# Patient Record
Sex: Male | Born: 1950 | Race: White | Hispanic: No | Smoking: Former smoker
Health system: Southern US, Community
[De-identification: ages and names within clinical notes are randomized; demographics above are authoritative.]

## PROBLEM LIST (undated history)

## (undated) DIAGNOSIS — C801 Malignant (primary) neoplasm, unspecified: Secondary | ICD-10-CM

## (undated) DIAGNOSIS — C679 Malignant neoplasm of bladder, unspecified: Secondary | ICD-10-CM

## (undated) HISTORY — PX: BRAIN SURGERY: SHX531

## (undated) HISTORY — PX: BLADDER SURGERY: SHX569

---
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
## 2003-10-16 ENCOUNTER — Other Ambulatory Visit: Payer: Self-pay

## 2005-12-20 ENCOUNTER — Emergency Department: Payer: Self-pay | Admitting: Emergency Medicine

## 2005-12-20 ENCOUNTER — Other Ambulatory Visit: Payer: Self-pay

## 2005-12-30 ENCOUNTER — Ambulatory Visit: Payer: Self-pay | Admitting: Emergency Medicine

## 2006-12-08 ENCOUNTER — Emergency Department: Payer: Self-pay | Admitting: Emergency Medicine

## 2014-02-23 ENCOUNTER — Inpatient Hospital Stay: Payer: Self-pay | Admitting: Internal Medicine

## 2014-02-23 LAB — COMPREHENSIVE METABOLIC PANEL
ALK PHOS: 49 U/L
ALT: 18 U/L (ref 12–78)
AST: 20 U/L (ref 15–37)
Albumin: 2.6 g/dL — ABNORMAL LOW (ref 3.4–5.0)
Anion Gap: 30 — ABNORMAL HIGH (ref 7–16)
BILIRUBIN TOTAL: 0.3 mg/dL (ref 0.2–1.0)
BUN: 24 mg/dL — ABNORMAL HIGH (ref 7–18)
CHLORIDE: 103 mmol/L (ref 98–107)
Calcium, Total: 7.8 mg/dL — ABNORMAL LOW (ref 8.5–10.1)
Co2: 6 mmol/L — CL (ref 21–32)
Creatinine: 3.36 mg/dL — ABNORMAL HIGH (ref 0.60–1.30)
GFR CALC AF AMER: 22 — AB
GFR CALC NON AF AMER: 19 — AB
GLUCOSE: 156 mg/dL — AB (ref 65–99)
Osmolality: 285 (ref 275–301)
POTASSIUM: 3.9 mmol/L (ref 3.5–5.1)
Sodium: 139 mmol/L (ref 136–145)
TOTAL PROTEIN: 5.1 g/dL — AB (ref 6.4–8.2)

## 2014-02-23 LAB — CBC WITH DIFFERENTIAL/PLATELET
BANDS NEUTROPHIL: 8 %
HCT: 15.7 % — AB (ref 40.0–52.0)
HGB: 4.7 g/dL — CL (ref 13.0–18.0)
Lymphocytes: 11 %
MCH: 30.8 pg (ref 26.0–34.0)
MCHC: 29.9 g/dL — ABNORMAL LOW (ref 32.0–36.0)
MCV: 103 fL — ABNORMAL HIGH (ref 80–100)
MONOS PCT: 4 %
Metamyelocyte: 4 %
Myelocyte: 1 %
NRBC/100 WBC: 1 /
Platelet: 292 10*3/uL (ref 150–440)
RBC: 1.52 10*6/uL — ABNORMAL LOW (ref 4.40–5.90)
RDW: 15.4 % — ABNORMAL HIGH (ref 11.5–14.5)
Segmented Neutrophils: 72 %
WBC: 43 10*3/uL — ABNORMAL HIGH (ref 3.8–10.6)

## 2014-02-23 LAB — APTT: Activated PTT: 23.5 secs — ABNORMAL LOW (ref 23.6–35.9)

## 2014-02-23 LAB — TROPONIN I

## 2014-02-23 LAB — PROTIME-INR
INR: 1.5
Prothrombin Time: 17.4 secs — ABNORMAL HIGH (ref 11.5–14.7)

## 2014-02-23 LAB — SALICYLATE LEVEL: Salicylates, Serum: 2.7 mg/dL

## 2014-02-23 LAB — LIPASE, BLOOD: LIPASE: 58 U/L — AB (ref 73–393)

## 2014-02-24 LAB — URINALYSIS, COMPLETE: Specific Gravity: 1.04 (ref 1.003–1.030)

## 2014-02-24 LAB — BASIC METABOLIC PANEL
ANION GAP: 6 — AB (ref 7–16)
Anion Gap: 7 (ref 7–16)
Anion Gap: 7 (ref 7–16)
Anion Gap: 7 (ref 7–16)
BUN: 30 mg/dL — ABNORMAL HIGH (ref 7–18)
BUN: 30 mg/dL — ABNORMAL HIGH (ref 7–18)
BUN: 30 mg/dL — AB (ref 7–18)
BUN: 25 mg/dL — ABNORMAL HIGH (ref 7–18)
CALCIUM: 7.2 mg/dL — AB (ref 8.5–10.1)
CHLORIDE: 108 mmol/L — AB (ref 98–107)
CREATININE: 1.64 mg/dL — AB (ref 0.60–1.30)
CREATININE: 1.23 mg/dL (ref 0.60–1.30)
Calcium, Total: 7.2 mg/dL — ABNORMAL LOW (ref 8.5–10.1)
Calcium, Total: 7 mg/dL — CL (ref 8.5–10.1)
Calcium, Total: 7.5 mg/dL — ABNORMAL LOW (ref 8.5–10.1)
Chloride: 111 mmol/L — ABNORMAL HIGH (ref 98–107)
Chloride: 109 mmol/L — ABNORMAL HIGH (ref 98–107)
Chloride: 110 mmol/L — ABNORMAL HIGH (ref 98–107)
Co2: 24 mmol/L (ref 21–32)
Co2: 25 mmol/L (ref 21–32)
Co2: 26 mmol/L (ref 21–32)
Co2: 24 mmol/L (ref 21–32)
Creatinine: 1.8 mg/dL — ABNORMAL HIGH (ref 0.60–1.30)
Creatinine: 1.51 mg/dL — ABNORMAL HIGH (ref 0.60–1.30)
EGFR (African American): 46 — ABNORMAL LOW
EGFR (African American): 51 — ABNORMAL LOW
EGFR (African American): 57 — ABNORMAL LOW
EGFR (African American): >60
EGFR (Non-African Amer.): 39 — ABNORMAL LOW
EGFR (Non-African Amer.): 44 — ABNORMAL LOW
GFR CALC NON AF AMER: 49 — AB
GLUCOSE: 115 mg/dL — AB (ref 65–99)
Glucose: 113 mg/dL — ABNORMAL HIGH (ref 65–99)
Glucose: 116 mg/dL — ABNORMAL HIGH (ref 65–99)
Glucose: 90 mg/dL (ref 65–99)
OSMOLALITY: 286 (ref 275–301)
OSMOLALITY: 288 (ref 275–301)
OSMOLALITY: 285 (ref 275–301)
Osmolality: 290 (ref 275–301)
POTASSIUM: 4.3 mmol/L (ref 3.5–5.1)
Potassium: 4.4 mmol/L (ref 3.5–5.1)
Potassium: 4.2 mmol/L (ref 3.5–5.1)
Potassium: 4.4 mmol/L (ref 3.5–5.1)
SODIUM: 142 mmol/L (ref 136–145)
Sodium: 140 mmol/L (ref 136–145)
Sodium: 141 mmol/L (ref 136–145)
Sodium: 141 mmol/L (ref 136–145)

## 2014-02-24 LAB — CBC WITH DIFFERENTIAL/PLATELET
BASOS ABS: 0 10*3/uL (ref 0.0–0.1)
BASOS PCT: 0.1 %
EOS PCT: 0 %
Eosinophil #: 0 10*3/uL (ref 0.0–0.7)
HCT: 19.1 % — ABNORMAL LOW (ref 40.0–52.0)
HGB: 6.4 g/dL — ABNORMAL LOW (ref 13.0–18.0)
Lymphocyte #: 1.4 10*3/uL (ref 1.0–3.6)
Lymphocyte %: 5.8 %
MCH: 30.8 pg (ref 26.0–34.0)
MCHC: 33.6 g/dL (ref 32.0–36.0)
MCV: 92 fL (ref 80–100)
MONO ABS: 1.8 x10 3/mm — AB (ref 0.2–1.0)
Monocyte %: 7.6 %
Neutrophil #: 21 10*3/uL — ABNORMAL HIGH (ref 1.4–6.5)
Neutrophil %: 86.5 %
Platelet: 102 10*3/uL — ABNORMAL LOW (ref 150–440)
RBC: 2.08 10*6/uL — ABNORMAL LOW (ref 4.40–5.90)
RDW: 14.7 % — ABNORMAL HIGH (ref 11.5–14.5)
WBC: 24.3 10*3/uL — ABNORMAL HIGH (ref 3.8–10.6)

## 2014-02-24 LAB — CK: CK, Total: 222 U/L

## 2014-02-25 ENCOUNTER — Ambulatory Visit: Payer: Self-pay | Admitting: Urology

## 2014-02-25 LAB — CBC WITH DIFFERENTIAL/PLATELET
BASOS PCT: 0.2 %
Basophil #: 0.1 10*3/uL (ref 0.0–0.1)
Basophil #: 0 10*3/uL (ref 0.0–0.1)
Basophil %: 0.9 %
EOS PCT: 0.2 %
Eosinophil #: 0 10*3/uL (ref 0.0–0.7)
Eosinophil #: 0 10*3/uL (ref 0.0–0.7)
Eosinophil %: 0.1 %
HCT: 24.8 % — ABNORMAL LOW (ref 40.0–52.0)
HCT: 22.3 % — ABNORMAL LOW (ref 40.0–52.0)
HGB: 8.4 g/dL — ABNORMAL LOW (ref 13.0–18.0)
HGB: 7.7 g/dL — ABNORMAL LOW (ref 13.0–18.0)
LYMPHS PCT: 9.5 %
LYMPHS PCT: 11.3 %
Lymphocyte #: 1.2 10*3/uL (ref 1.0–3.6)
Lymphocyte #: 1.1 10*3/uL (ref 1.0–3.6)
MCH: 30.2 pg (ref 26.0–34.0)
MCH: 30.5 pg (ref 26.0–34.0)
MCHC: 33.8 g/dL (ref 32.0–36.0)
MCHC: 34.3 g/dL (ref 32.0–36.0)
MCV: 89 fL (ref 80–100)
MCV: 89 fL (ref 80–100)
MONOS PCT: 6 %
MONOS PCT: 7.9 %
Monocyte #: 0.7 x10 3/mm (ref 0.2–1.0)
Monocyte #: 0.8 x10 3/mm (ref 0.2–1.0)
NEUTROS ABS: 8.2 10*3/uL — AB (ref 1.4–6.5)
NEUTROS PCT: 83.4 %
NEUTROS PCT: 80.5 %
Neutrophil #: 10.4 10*3/uL — ABNORMAL HIGH (ref 1.4–6.5)
PLATELETS: 49 10*3/uL — AB (ref 150–440)
PLATELETS: 39 10*3/uL — AB (ref 150–440)
RBC: 2.78 10*6/uL — AB (ref 4.40–5.90)
RBC: 2.51 10*6/uL — AB (ref 4.40–5.90)
RDW: 14.8 % — AB (ref 11.5–14.5)
RDW: 15 % — ABNORMAL HIGH (ref 11.5–14.5)
WBC: 12.4 10*3/uL — ABNORMAL HIGH (ref 3.8–10.6)
WBC: 10.1 10*3/uL (ref 3.8–10.6)

## 2014-02-25 LAB — BASIC METABOLIC PANEL
Anion Gap: 5 — ABNORMAL LOW (ref 7–16)
Anion Gap: 8 (ref 7–16)
BUN: 20 mg/dL — AB (ref 7–18)
BUN: 17 mg/dL (ref 7–18)
CHLORIDE: 107 mmol/L (ref 98–107)
CO2: 27 mmol/L (ref 21–32)
Calcium, Total: 7.6 mg/dL — ABNORMAL LOW (ref 8.5–10.1)
Calcium, Total: 7.8 mg/dL — ABNORMAL LOW (ref 8.5–10.1)
Chloride: 109 mmol/L — ABNORMAL HIGH (ref 98–107)
Co2: 26 mmol/L (ref 21–32)
Creatinine: 1.11 mg/dL (ref 0.60–1.30)
Creatinine: 0.77 mg/dL (ref 0.60–1.30)
EGFR (African American): >60
EGFR (African American): >60
EGFR (Non-African Amer.): >60
EGFR (Non-African Amer.): >60
Glucose: 82 mg/dL (ref 65–99)
Glucose: 79 mg/dL (ref 65–99)
OSMOLALITY: 283 (ref 275–301)
Osmolality: 282 (ref 275–301)
Potassium: 4.1 mmol/L (ref 3.5–5.1)
Potassium: 4.1 mmol/L (ref 3.5–5.1)
Sodium: 141 mmol/L (ref 136–145)
Sodium: 141 mmol/L (ref 136–145)

## 2014-02-25 LAB — URINE CULTURE

## 2014-02-25 LAB — MAGNESIUM: Magnesium: 1.8 mg/dL

## 2014-02-26 LAB — BASIC METABOLIC PANEL
ANION GAP: 4 — AB (ref 7–16)
BUN: 17 mg/dL (ref 7–18)
CALCIUM: 7.4 mg/dL — AB (ref 8.5–10.1)
CO2: 27 mmol/L (ref 21–32)
CREATININE: 1.08 mg/dL (ref 0.60–1.30)
Chloride: 107 mmol/L (ref 98–107)
EGFR (African American): >60
EGFR (Non-African Amer.): >60
Glucose: 89 mg/dL (ref 65–99)
Osmolality: 277 (ref 275–301)
Potassium: 3.8 mmol/L (ref 3.5–5.1)
Sodium: 138 mmol/L (ref 136–145)

## 2014-02-26 LAB — CBC WITH DIFFERENTIAL/PLATELET
BASOS PCT: 0.2 %
Basophil #: 0 10*3/uL (ref 0.0–0.1)
Eosinophil #: 0 10*3/uL (ref 0.0–0.7)
Eosinophil %: 0.3 %
HCT: 21.4 % — ABNORMAL LOW (ref 40.0–52.0)
HGB: 7.2 g/dL — AB (ref 13.0–18.0)
LYMPHS ABS: 1 10*3/uL (ref 1.0–3.6)
Lymphocyte %: 14.8 %
MCH: 29.9 pg (ref 26.0–34.0)
MCHC: 33.5 g/dL (ref 32.0–36.0)
MCV: 90 fL (ref 80–100)
Monocyte #: 0.6 x10 3/mm (ref 0.2–1.0)
Monocyte %: 9 %
NEUTROS ABS: 5.2 10*3/uL (ref 1.4–6.5)
NEUTROS PCT: 75.7 %
Platelet: 52 10*3/uL — ABNORMAL LOW (ref 150–440)
RBC: 2.39 10*6/uL — ABNORMAL LOW (ref 4.40–5.90)
RDW: 14.5 % (ref 11.5–14.5)
WBC: 6.9 10*3/uL (ref 3.8–10.6)

## 2014-02-27 LAB — CBC WITH DIFFERENTIAL/PLATELET
BASOS ABS: 0 10*3/uL (ref 0.0–0.1)
Basophil %: 0.4 %
Eosinophil #: 0.1 10*3/uL (ref 0.0–0.7)
Eosinophil %: 0.9 %
HCT: 22.2 % — ABNORMAL LOW (ref 40.0–52.0)
HGB: 7.5 g/dL — ABNORMAL LOW (ref 13.0–18.0)
LYMPHS PCT: 13.8 %
Lymphocyte #: 1 10*3/uL (ref 1.0–3.6)
MCH: 29.9 pg (ref 26.0–34.0)
MCHC: 33.7 g/dL (ref 32.0–36.0)
MCV: 89 fL (ref 80–100)
MONOS PCT: 7.4 %
Monocyte #: 0.5 x10 3/mm (ref 0.2–1.0)
NEUTROS ABS: 5.4 10*3/uL (ref 1.4–6.5)
Neutrophil %: 77.5 %
Platelet: 62 10*3/uL — ABNORMAL LOW (ref 150–440)
RBC: 2.5 10*6/uL — AB (ref 4.40–5.90)
RDW: 14.6 % — ABNORMAL HIGH (ref 11.5–14.5)
WBC: 6.9 10*3/uL (ref 3.8–10.6)

## 2014-03-01 LAB — HEMOGLOBIN: HGB: 8.2 g/dL — AB (ref 13.0–18.0)

## 2014-03-01 LAB — CULTURE, BLOOD (SINGLE)

## 2014-03-01 LAB — PLATELET COUNT: PLATELETS: 93 10*3/uL — AB (ref 150–440)

## 2014-03-02 LAB — CBC WITH DIFFERENTIAL/PLATELET
BASOS ABS: 0 10*3/uL (ref 0.0–0.1)
Basophil %: 0.2 %
Eosinophil #: 0.3 10*3/uL (ref 0.0–0.7)
Eosinophil %: 2.1 %
HCT: 26.2 % — ABNORMAL LOW (ref 40.0–52.0)
HGB: 8.7 g/dL — AB (ref 13.0–18.0)
LYMPHS PCT: 9.4 %
Lymphocyte #: 1.4 10*3/uL (ref 1.0–3.6)
MCH: 29 pg (ref 26.0–34.0)
MCHC: 33.1 g/dL (ref 32.0–36.0)
MCV: 88 fL (ref 80–100)
MONOS PCT: 3.7 %
Monocyte #: 0.5 x10 3/mm (ref 0.2–1.0)
Neutrophil #: 12.4 10*3/uL — ABNORMAL HIGH (ref 1.4–6.5)
Neutrophil %: 84.6 %
Platelet: 136 10*3/uL — ABNORMAL LOW (ref 150–440)
RBC: 2.99 10*6/uL — ABNORMAL LOW (ref 4.40–5.90)
RDW: 14.6 % — ABNORMAL HIGH (ref 11.5–14.5)
WBC: 14.7 10*3/uL — ABNORMAL HIGH (ref 3.8–10.6)

## 2014-03-15 LAB — PATHOLOGY REPORT

## 2014-03-23 ENCOUNTER — Ambulatory Visit: Payer: Self-pay | Admitting: Urology

## 2014-03-24 LAB — URINE CULTURE

## 2014-03-29 ENCOUNTER — Ambulatory Visit: Payer: Self-pay | Admitting: Urology

## 2014-03-30 LAB — PATHOLOGY REPORT

## 2015-02-03 NOTE — Consult Note (Signed)
Chief Complaint:  Subjective/Chief Complaint No complaints   VITAL SIGNS/ANCILLARY NOTES: **Vital Signs.:   19-May-15 08:12  Temperature Temperature (F) 98.3  Pulse Pulse 530  Systolic BP Systolic BP 051  Diastolic BP (mmHg) Diastolic BP (mmHg) 83   Brief Assessment:  GEN no acute distress   Additional Physical Exam CBI effluent amber on minimal flow   Assessment/Plan:  Assessment/Plan:  Assessment Gross hematuria- resolving   Plan 1. d/c CBI 2. If no medical contraindication to anesthesia will proceed with cystoscopy and possible TURBT on 5/20.  The procedure was discussed including portential risks of bleeding, infection and bladder perforation.  He indicated all questions were answered to his satisfaction and desires to proceed.   Electronic Signatures: Abbie Sons (MD)  (Signed 19-May-15 16:36)  Authored: Chief Complaint, VITAL SIGNS/ANCILLARY NOTES, Brief Assessment, Assessment/Plan   Last Updated: 19-May-15 16:36 by Abbie Sons (MD)

## 2015-02-03 NOTE — Consult Note (Signed)
Brief Consult Note: Diagnosis: Thrombocytopenia.   Comments: Patient had a normal platelet count 2 days ago. NOw significant decrease in plt count. Would discontinue all heparin products and send off HIT panel. If active bleeding would transfuse 1 unit of platelets. Otherwise transfuse if plt count < 10 000.  Electronic Signatures: Georges Mouse (MD)  (Signed 16-May-15 16:36)  Authored: Brief Consult Note   Last Updated: 16-May-15 16:36 by Georges Mouse (MD)

## 2015-02-03 NOTE — Op Note (Signed)
PATIENT NAME:  Johnny Martin, Johnny Martin MR#:  [id number] DATE OF BIRTH:  [birth year]  DATE OF PROCEDURE:  03/01/2014  PREOPERATIVE DIAGNOSIS:  Gross hematuria.   POSTOPERATIVE DIAGNOSIS: 1.  Bladder tumor.   PROCEDURE:  Transurethral resection of bladder tumor.   SURGEON: John Giovanni, M.D.   ASSISTANT:  None.   ANESTHESIA:  General.   INDICATION: A 64 year old male admitted with gross hematuria and clot retention. He has had intermittent gross hematuria for the past 8 months. Hemoglobin on admission was 4.7. On initial consultation, a hematuria catheter was placed and a significant amount of clot was removed. He was then placed on continuous bladder irrigation with resolution of his hematuria. He is medically stable and presents for cystoscopy under anesthesia.   DESCRIPTION OF PROCEDURE:  The patient was taken to the operating room and placed on the table in the supine position. A general anesthetic was administered via an endotracheal tube. He was then placed in the low lithotomy position and his external genitalia were prepped and draped in the usual fashion. A 27 French continuous flow resectoscope sheath with visual obturator was lubricated and passed without difficulty. There was mild lateral lobe enlargement and no significant bladder neck elevation in the prosthetic urethra. The remainder of the urethra was normal in appearance. There was a moderate amount of old-appearing clot in the bladder which was removed with irrigation. On the right lateral wall was a large papillary tumor, estimated size greater than 8 cm. No bleeding was noted. 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 One of the mid wall diverticulum had papillary tumor on the edges and a small papillary tumor within the diverticulum. On the posterior wall was an area of erythema and bullous edema though most likely secondary to inflammatory changes of his Foley catheter. The ureteral orifices were  normal appearing with clear efflux. The right ureteral orifice was just inferior to the inferior margin of the tumor. Working from posterior to anterior and superior to inferior, the bladder tumor was resected. Hemostasis was obtained with cautery. After all visible tumor was resected, it was removed with irrigation. The tumor encroaching the neck of the mid diverticulum was also resected. Second specimen, base of the tumor, was sent separately, and a third specimen involving the inflammatory changes of the posterior wall was resected. Button electrode was then placed through the cystoscope and hemostasis was obtained. The small papillary tumor within the mid diverticulum was cauterized. At the completion of the procedure, hemostasis was adequate. No visible tumor was seen. The ureteral orifices were normal-appearing with clear efflux. A 20 French Foley catheter was placed with return of clear effluent upon irrigation. A B and O suppository was placed per rectum. Prostate was estimated approximately 30 grams, smooth, without nodules. The patient was taken to PACU in stable condition. There were no complications. EBL was minimal.    ____________________________ Ronda Fairly. Bernardo Heater, MD scs:dmm D: 03/08/2014 14:51:22 ET T: 03/08/2014 21:29:42 ET JOB#: [id number]  cc: Nicki Reaper C. Bernardo Heater, MD, <Dictator> Abbie Sons MD ELECTRONICALLY SIGNED 03/22/2014 15:17

## 2015-02-03 NOTE — Consult Note (Signed)
PATIENT NAME:  Johnny Martin, Johnny Martin MR#:  [id number] DATE OF BIRTH:  23-Jan-1951  DATE OF CONSULTATION:  02/24/2014  REFERRING PHYSICIAN:  Dr. Waldron Labs. CONSULTING PHYSICIAN:  Scott C. Bernardo Heater, MD  REASON FOR CONSULTATION:  Hematuria.   HISTORY OF PRESENT ILLNESS: A 64 year old white male presented to the Emergency Department with hypoxia and pallor. His hemoglobin was 4.7. Creatinine was 3.36. Foley catheter was placed which was remarkable for gross hematuria. He was placed on continuous bladder irrigation. The patient states that he has had intermittent gross hematuria for the past 8 months. He has mild urinary frequency and hesitancy. He was seen in the Emergency Department in 2008 for gross hematuria. He states he saw a urologist 3 to 4 years ago and states he had a cystoscopy which did not show any abnormalities. He does not remember who he saw and no records could be found on review.  CT angiography did show filling defects within the bladder without hydronephrosis. This was performed last night. A renal ultrasound performed this morning showed bilateral hydronephrosis and was felt to show a 12 cm bladder mass. Late this morning, his CBI stopped flowing and his catheter could not be irrigated. The CBI has been turned off.   PAST MEDICAL HISTORY: 1.  Hypertension.  2.  Tobacco abuse.  3.  Alcohol abuse.   PAST SURGICAL HISTORY:  None.   MEDICATIONS ON ADMISSION:  None.  SOCIAL HISTORY:  The patient is a 1-1/2 pack per day smoker for several years. He drinks approximately 1 case of beer per week.   ALLERGIES:  NKDA.  REVIEW OF SYSTEMS.  CONSTITUTIONAL:  Denies fever, chills. Positive weakness.  EYES:  Denies visual changes.  ENT:  Denies hearing loss.  PULMONARY:  Denies cough, shortness breath.  CARDIOVASCULAR:  Denies chest pain, palpitations.  GASTROINTESTINAL:  Denies nausea, vomiting. Positive lower abdominal discomfort.  GENITOURINARY:  As per the HPI.  ENDOCRINE:  Denies polyuria,  polydipsia.  HEMATOLOGIC:  No history of bleeding or clotting disorders.  INTEGUMENT:  No history of rash.  MUSCULOSKELETAL:  Denies joint pain.  NEUROLOGIC:  No history of CVA, TIA.  PSYCHIATRIC:  Denies depression, anxiety.   PHYSICAL EXAMINATION: VITAL SIGNS:  BP was 133/56, pulse 110. Temp is 98.4.  GENERAL:  Pale male in moderate distress secondary to suprapubic discomfort. The bladder is palpable above the symphysis pubis. There was mild tenderness present.  GENITOURINARY:   There is a 24 three-way Foley catheter in place, which was removed. The phallus without lesions. Testes descended bilaterally without masses or tenderness. Prostate exam was deferred at this time.   A 24 French three-way Couvelaire catheter was placed without difficulty. A large amount of dark, bloody urine was obtained. Balloon was inflated and the catheter was manually irrigated with extensive dark clot removed. After 4 liters of irrigation with sterile saline, the effluent was clot-free and pink-tinged. The catheter was placed back to CBI and on a moderate flow, the effluent was light pink. CT was reviewed. There is not a delayed study and there are no gross renal masses or hydronephrosis. The bladder distended with filling defects. The renal ultrasound performed this morning shows mild hydronephrosis.   IMPRESSION: 1.  Gross hematuria with clot retention.  2.  New onset mild hydronephrosis bilaterally most likely secondary to urinary retention.  3.  The majority of the intraluminal bladder mass most likely represents clot.  4.  Acute renal failure, probable multifactorial.   RECOMMENDATION: 1.  Continue CBI.   2.  The patient will need cystoscopy in the near future to evaluate for transitional cell carcinoma and other lower tract sources of bleeding.  3.  Will follow.     ____________________________ Ronda Fairly Bernardo Heater, MD scs:dmm D: 02/24/2014 18:37:06 ET T: 02/24/2014 19:58:33 ET JOB#: [id number]  cc: Nicki Reaper  C. Bernardo Heater, MD, <Dictator> Abbie Sons MD ELECTRONICALLY SIGNED 03/08/2014 15:21

## 2015-02-03 NOTE — Consult Note (Signed)
Chief Complaint:  Subjective/Chief Complaint Pt seen at ~ 1300.  No complaints   VITAL SIGNS/ANCILLARY NOTES: **Vital Signs.:   18-May-15 15:24  Temperature Temperature (F) 98.1  Pulse Pulse 95  Systolic BP Systolic BP 146  Diastolic BP (mmHg) Diastolic BP (mmHg) 81   Brief Assessment:  GEN no acute distress   Additional Physical Exam CBI effluent clear on moderate flow   Lab Results: Routine Chem:  17-May-15 03:07   Glucose, Serum 89  BUN 17  Creatinine (comp) 1.08  Sodium, Serum 138  Potassium, Serum 3.8  Chloride, Serum 107  CO2, Serum 27  Calcium (Total), Serum  7.4  Anion Gap  4  Osmolality (calc) 277  eGFR (African American) >60  eGFR (Non-African American) >60 (eGFR values <60mL/min/1.73 m2 may be an indication of chronic kidney disease (CKD). Calculated eGFR is useful in patients with stable renal function. The eGFR calculation will not be reliable in acutely ill patients when serum creatinine is changing rapidly. It is not useful in  patients on dialysis. The eGFR calculation may not be applicable to patients at the low and high extremes of body sizes, pregnant women, and vegetarians.)  Routine Hem:  17-May-15 03:07   WBC (CBC) 6.9  RBC (CBC)  2.39  Hemoglobin (CBC)  7.2  Hematocrit (CBC)  21.4  Platelet Count (CBC)  52  MCV 90  MCH 29.9  MCHC 33.5  RDW 14.5  Neutrophil % 75.7  Lymphocyte % 14.8  Monocyte % 9.0  Eosinophil % 0.3  Basophil % 0.2  Neutrophil # 5.2  Lymphocyte # 1.0  Monocyte # 0.6  Eosinophil # 0.0  Basophil # 0.0 (Result(s) reported on 26 Feb 2014 at 04:00AM.)   Assessment/Plan:  Assessment/Plan:  Assessment Gross hematuria- improving   Plan CBI was slowed and would continue to taper.  If OK medically I can add pt to my Weds OR sched for cystoscopy and possible TURBT   Electronic Signatures: Stoioff, Scott C (MD)  (Signed 18-May-15 16:34)  Authored: Chief Complaint, VITAL SIGNS/ANCILLARY NOTES, Brief Assessment, Lab  Results, Assessment/Plan   Last Updated: 18-May-15 16:34 by Stoioff, Scott C (MD) 

## 2015-02-03 NOTE — Consult Note (Signed)
Chief Complaint:  Subjective/Chief Complaint Urology Consult F/U: Day #3 CBI  Pt remains without new GU complaints. Foley clotted off last night requiring manual irrigation to clear clots from the bladder. Platelet transfusion ordered with improvement in gross hematuria.   Manual irrigation required, again, this morning, with a single clot obtained.   VITAL SIGNS/ANCILLARY NOTES: **Vital Signs.:   17-May-15 05:00  Vital Signs Type Routine  Temperature Temperature (F) 97.5  Celsius 36.3  Temperature Source axillary  Pulse Pulse 74  Pulse source if not from Vital Sign Device per cardiac monitor  Respirations Respirations 16  Systolic BP Systolic BP 465  Diastolic BP (mmHg) Diastolic BP (mmHg) 61  Mean BP 80  BP Source  if not from Vital Sign Device non-invasive  Pulse Ox % Pulse Ox % 100  Pulse Ox Activity Level  At rest  Oxygen Delivery 2L; Nasal Cannula  Pulse Ox Heart Rate 74    07:46  Temperature Temperature (F) 98.4  Celsius 36.8  Temperature Source oral   Brief Assessment:  GEN well developed, well nourished, no acute distress   Respiratory normal resp effort  no use of accessory muscles   Additional Physical Exam Urine light pink to clear, without clots, on moderate CBI   Lab Results: Routine Chem:  17-May-15 03:07   Glucose, Serum 89  BUN 17  Creatinine (comp) 1.08  Sodium, Serum 138  Potassium, Serum 3.8  Chloride, Serum 107  CO2, Serum 27  Calcium (Total), Serum  7.4  Anion Gap  4  Osmolality (calc) 277  eGFR (African American) >60  eGFR (Non-African American) >60 (eGFR values <58m/min/1.73 m2 may be an indication of chronic kidney disease (CKD). Calculated eGFR is useful in patients with stable renal function. The eGFR calculation will not be reliable in acutely ill patients when serum creatinine is changing rapidly. It is not useful in  patients on dialysis. The eGFR calculation may not be applicable to patients at the low and high extremes of  body sizes, pregnant women, and vegetarians.)  Routine Hem:  16-May-15 11:29   WBC (CBC) 10.1  RBC (CBC)  2.51  Hemoglobin (CBC)  7.7  Hematocrit (CBC)  22.3  Platelet Count (CBC)  39  MCV 89  MCH 30.5  MCHC 34.3  RDW  15.0  Neutrophil % 80.5  Lymphocyte % 11.3  Monocyte % 7.9  Eosinophil % 0.1  Basophil % 0.2  Neutrophil #  8.2  Lymphocyte # 1.1  Monocyte # 0.8  Eosinophil # 0.0  Basophil # 0.0 (Result(s) reported on 25 Feb 2014 at 11:43AM.)  17-May-15 03:07   WBC (CBC) 6.9  RBC (CBC)  2.39  Hemoglobin (CBC)  7.2  Hematocrit (CBC)  21.4  Platelet Count (CBC)  52  MCV 90  MCH 29.9  MCHC 33.5  RDW 14.5  Neutrophil % 75.7  Lymphocyte % 14.8  Monocyte % 9.0  Eosinophil % 0.3  Basophil % 0.2  Neutrophil # 5.2  Lymphocyte # 1.0  Monocyte # 0.6  Eosinophil # 0.0  Basophil # 0.0 (Result(s) reported on 26 Feb 2014 at 04:00AM.)   Assessment/Plan:  Invasive Device Daily Assessment of Necessity:  Does the patient currently have any of the following indwelling devices? foley   Indwelling Urinary Catheter continued, requirement due to   Reason to continue Indwelling Urinary Catheter bladder irrigation is required   Assessment/Plan:  Assessment 1. Gross Hematuria -continues to requires NS CBI at a moderate rate with intermittent manual irrigation -Hct drifting downward to  21.4 from 24.8 -Plt count up to 52k following platelet transfusion last night after decreasing to 39k with increasing gross hematuria requiring manual irrigation to resolve clot obstruction of the foley, despite CBI  2. Blood Loss Anemia -see #1 (ongoing with persistent gross hematuria)  3. Thrombocytopenia -see #1 (appropriate imrpovement with platelet transfusion)   Plan 1. Continue Normal Saline CBI to keep urine light pink to clear with manual irrigation prn 2. Continue to monitor Platelet Count and H/H closely 3. Consider correction of hypocalcemia. 4. Continue close hemodynamic  monitoring.  Dr. Bernardo Heater will resume care Monday morning.   Electronic Signatures: Darcella Cheshire (MD)  (Signed 17-May-15 11:37)  Authored: Chief Complaint, VITAL SIGNS/ANCILLARY NOTES, Brief Assessment, Lab Results, Assessment/Plan   Last Updated: 17-May-15 11:37 by Darcella Cheshire (MD)

## 2015-02-03 NOTE — Op Note (Signed)
PATIENT NAME:  MIKLOS, BIDINGER MR#:  [id number] DATE OF BIRTH:  05/08/1951  DATE OF PROCEDURE:  02/24/2014  PREOPERATIVE DIAGNOSES:  1.  Acute renal failure.  2.  Bladder mass.  3.  Anemia.   POSTOPERATIVE DIAGNOSES: 1.  Acute renal failure.  2.  Bladder mass.  3.  Anemia.   PROCEDURES:  1.  Ultrasound guidance for vascular access, right jugular vein.  2.  Placement of a 20 cm long DuoGlide dialysis catheter, right jugular vein.   SURGEON: Leotis Pain, M.D.   ANESTHESIA: Local.   ESTIMATED BLOOD LOSS: Minimal.   INDICATION FOR PROCEDURE: A 64 year old male, who was admitted yesterday with anemia and hematuria. He has a bladder mass. He is now in acute renal failure. A dialysis catheter is needed for initiation of renal replacement therapy.   DESCRIPTION OF PROCEDURE: The patient is laid flat in the critical care bed. The right neck was sterilely prepped and draped and a sterile surgical field was created. The right jugular vein was visualized with ultrasound and found to be widely patent. It was then accessed under direct ultrasound guidance without difficulty with a Seldinger needle. A J-wire was then placed. After skin nick and dilatation, the 20 cm long DuoGlide dialysis catheter was placed over the wire, and the wire was removed. Both lumens withdrew dark red blood and flushed easily with sterile saline. It was secured to the skin at 20 cm with 3 nylon sutures. Sterile dressing was placed. The patient tolerated the procedure well.   ____________________________ Algernon Huxley, MD jsd:aw D: 02/24/2014 10:03:11 ET T: 02/24/2014 10:16:45 ET JOB#: [id number]  cc: Algernon Huxley, MD, <Dictator> Algernon Huxley MD ELECTRONICALLY SIGNED 03/13/2014 14:46

## 2015-02-03 NOTE — H&P (Signed)
PATIENT NAME:  Johnny Martin, Johnny Martin MR#:  [id number] DATE OF BIRTH:  1950/12/30  DATE OF ADMISSION:  02/24/2014  ATTENDING PHYSICIAN:  Dr. Dwana Curd   PRIMARY CARE PHYSICIAN: None.   CHIEF COMPLAINT: Shortness of breath, weakness.   HISTORY OF PRESENT ILLNESS: This is a 64 year old male with significant past medical history of hypertension, has not been seeing any physician for years, not taking any home medications. The patient presents with above complaints. The patient was extremely pale upon presentation, hypoxic. The patient had basic workup done which did show acute renal failure with a creatinine of 3.36 as well, showing severe anemia with hemoglobin of 4.7. Leukocytes are 43,000 and severe metabolic acidosis with pH of 7.18 and bicarbonate of 6. The patient does not have blood work at baseline and he has not been seen by any physician for years now. The patient reports he has been having hematuria for a few months now, which has worsened recently.  After Foley catheter insertion, the patient has been having frank hematuria in his Foley catheter bag.  The patient had CT chest, abdomen, and pelvis with IV contrast to evaluate for his severe anemia and leukocytosis, without any source of infection that could be identified.  It showed distended bladder with heterogeneous density suggesting hemorrhage and possible mass lesion and multiple bladder diverticula,  with no evidence off any mediastinal or any mediastinal arteries or retroperitoneal hematoma.  As well, patient was hypothermic at 94 Fahrenheit. As well, patient so far received 2 liters fluid bolus and he is receiving his 1st unit of packed red blood cells. His INR came back at 1.5. The patient denies taking any aspirin or any over-the-counter medication.   PAST MEDICAL HISTORY: Hypertension, tobacco abuse, alcohol abuse, noncompliance.   PAST SURGICAL HISTORY: None.   SOCIAL HISTORY: The patient lives at home with his daughter.  Smokes  1-1/2 packs per day. As well, he drinks beer every night. He drinks on average 24 cans per week.    FAMILY HISTORY: No family history of DVT, PE, or coronary artery disease at young age.   ALLERGIES: No known drug allergies.    HOME MEDICATIONS: The patient is not taking any home medications.   REVIEW OF SYSTEMS:  CONSTITUTIONAL: The patient denies fever, chills. Complains of fatigue, weakness.  EYES: Denies blurry vision, double vision, or inflammation.  EARS, NOSE, AND THROAT:  Denies any hearing loss, epistaxis.  RESPIRATORY: Denies cough, wheezing, hemoptysis. Reports shortness of breath.  CARDIOVASCULAR: Denies chest pain, palpitations, syncope.  GASTROINTESTINAL: Denies nausea, vomiting, diarrhea, abdominal pain, hematemesis, melena.  GENITOURINARY: Denies any dysuria, polyuria. Reports hematuria. Denies any renal colic.  ENDOCRINE: Denies polyuria, polydipsia, heat or cold intolerance.  HEMATOLOGY: Denies any history of anemia, easy bruising  INTEGUMENTARY: Denies acne, rash, or skin lesion.  MUSCULOSKELETAL: Denies any swelling, gout, arthritis, cramps.  NEUROLOGIC: No history of CVA, TIA, vertigo, ataxia, tremor.  PSYCHIATRIC: Denies anxiety, insomnia, depression.   PHYSICAL EXAMINATION:  VITAL SIGNS: Temperature 94.5, by presentation  temperature 95.4, pulse 116, respiratory rate 36, blood pressure 106/66, saturating 89% on room air.  GENERAL: Elderly, frail male lying in bed, in no apparent distress.  HEENT: Head atraumatic, normocephalic. Pale conjunctivae. Anicteric sclerae. Dry oral mucosa.  NECK: Supple. No thyromegaly. No JVD.  CHEST: Good air entry bilaterally. No wheezing, rales, or rhonchi.  CARDIOVASCULAR: S1, S2 heard. No rubs, murmurs, gallops. Tachycardic.  ABDOMEN: Mild tenderness in the suprapubic area. No rebound, no guarding. Bowel sounds present at 4  quadrants.  EXTREMITIES: No edema. No clubbing. No cyanosis. Pedal and radial pulses felt.  PSYCHIATRIC:  Awake, alert x 3. Intact judgment and insight.  NEUROLOGIC: Cranial nerves grossly intact. Motor 5/5. No focal deficits.  SKIN: Pale and dry.  MUSCULOSKELETAL: No joint effusion or erythema.   PERTINENT LABORATORY DATA: Glucose 156, BUN 24, creatinine 3.36, sodium 139, potassium 3.9, chloride 103, CO2 of 6, ALT 18, AST 20, alkaline phosphatase 49. Troponin is 0.02. White blood cells 43, hemoglobin 4.7, hematocrit 15.7, platelets 401,027, INR 1.5, salicylate 2.7. ABG showing 7.18 pH, pCO2 less than 19, and pO2 of 208.   IMAGING STUDIES: CT angiograph chest, abdomen, and pelvis with and without contrast showing distended bladder with heterogenous density suggesting hemorrhage and possible mass lesions, multiple bladder diverticuli. Cystoscopy recommended. No evidence of thoracic or abdominal aortic aneurysm or dissection.  No mediastinal or retroperitoneal hematomas   ASSESSMENT AND PLAN:  1. Anemia. This is due to blood loss, most likely due to his hematuria. The patient will be transfused total of 3 units. Will be given fluid boluses as well to replete his volume depletion. We will hold all forms of anticoagulation currently.  Patient has frank blood in his Foley catheter. We will check bladder scan, and if patient has increased volume on his bladder scan, we will start him on bladder irrigation. The patient had Hemoccult done which had normal color stools. It was very minimally Hemoccult positive, but I think this most likely has to do with the blood around his rectum while the exam was done. He will be kept on IV Protonix 40 mg IV b.i.d.  2. Systemic inflammatory response syndrome. The patient is with significant leukocytosis, hypothermic, tachycardic with severe metabolic acidosis. He meets systemic inflammatory response syndrome  criteria. We will follow on septic workup. Blood cultures were sent.  He will be started on Rocephin empirically.  3. Hypothermia. The patient will be kept on a warming  blanket.  4. Acute renal failure. This appears to be multifactorial, but mainly due to hypovolemia and renal failure. The patient will be kept on aggressive IV fluid hydration and will consult nephrology. We will check renal ultrasound.  5. Tobacco abuse. The patient was counseled.  6. Alcohol abuse. The patient will be started on CIWA protocol.  7. Metabolic acidosis. This is due to his volume depletion and acute renal failure. The patient will be kept on aggressive IV fluid hydration.  8. Deep vein thrombosis prophylaxis. Sequential compression device.   CODE STATUS: Full code.    TOTAL TIME SPENT ON ADMISSION AND PATIENT CARE:  This is a critically ill patient,  55 minutes.     ____________________________ Albertine Patricia, MD dse:dd D: 02/24/2014 00:35:37 ET T: 02/24/2014 01:54:49 ET JOB#: [id number]  cc: Albertine Patricia, MD, <Dictator> Seraphine Gudiel Graciela Husbands MD ELECTRONICALLY SIGNED 02/25/2014 3:56

## 2015-02-03 NOTE — Consult Note (Signed)
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 OK for d/c from GU standpoint.  Family was concerned that he has n ot been ambulatory and may need PT 2. D/C home with Foley. F/U next week for catheter removal and review of path report.   Electronic Signatures: Abbie Sons (MD)  (Signed 21-May-15 08:08)  Authored: Chief Complaint, VITAL SIGNS/ANCILLARY NOTES, Brief Assessment, Assessment/Plan   Last Updated: 21-May-15 08:08 by Abbie Sons (MD)

## 2015-02-03 NOTE — Discharge Summary (Signed)
PATIENT NAME:  Johnny Martin, Johnny Martin MR#:  [id number] DATE OF BIRTH:  [birth year]  DATE OF ADMISSION:  02/23/2014 DATE OF DISCHARGE:  03/02/2014  PRESENTING COMPLAINT: Hematuria.   DISCHARGE DIAGNOSES:  1. 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 Status post 4-unit blood transfusion.  2. Bladder mass status post transurethral resection of bladder tumor done by Dr. Bernardo Heater.  Pathology results pending.  3. Systemic inflammatory response syndrome secondary to urinary tract infection with hematuria.  4. History of hypertension with elevated blood pressure.  5. Acute renal failure status post continuous renal replacement therapy, improved.  6. Tobacco abuse.  7. Alcohol abuse.  8. Acute thrombocytopenia, improving.   MEDICATIONS AT DISCHARGE:  1. Metoprolol extended-release 25 mg daily.  2. Calcium with vitamin D 1 tablet b.i.d.  3. Cipro 500 mg p.o. b.i.d.  4. Percocet 5/325 one p.o. q. 6 p.r.n.  5. Home health, physical therapy.  6. Patient will be discharged with Foley bag attached to the leg.   FOLLOWUP: With Dr. Bernardo Heater in 1 week. Home health PT has been arranged.   DISCHARGE LABORATORY AND RADIOLOGICAL DATA:  Hemoglobin is 8.7, platelet count is 136,000. White count is 14.7.  Magnesium 1.8. Ultrasound kidneys, new bilateral hydronephrosis, 12 cm bladder mass. Urine culture negative in 24 hours. Blood cultures negative. CT of the abdomen showed distended bladder with density suggesting hemorrhage and possible mass lesions. Multiple bladder diverticula. Cystoscopy recommended. The rest of the CT abdomen and chest is negative.   CONSULTATION:   1. Dr. Bernardo Heater, urology.  2. Nephrology, Dr. Candiss Norse, Dr. Holley Raring.   BRIEF SUMMARY OF HOSPITAL COURSE: This patient is a [name] with history of hypertension, not on any medication and ongoing tobacco and alcohol abuse along with history  of hematuria on and off for the last several months, comes in with:  1. 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 Came in with significant hematuria, suspected from bladder mass bleeding. The patient was admitted in the intensive care unit, came in with hemoglobin of 4. Received 4 units of blood transfusion and his hemoglobin at discharge was 8.4. He was seen by Dr. Bernardo Heater, recommended CBI which was continued for several days. He underwent TURBT by Dr. Bernardo Heater on May 20th and tolerated the procedure well. Pathology results are pending. Patient will be discharged on Foley per Dr. Dene Gentry recommendation, along with antibiotics and follow up as outpatient in a week.  2. Systemic inflammatory response syndrome secondary to urinary tract infection with hematuria. The patient was empirically on IV Zosyn, changed to p.o. Cipro as outpatient. White count was 43,000 at admission. At discharge, it was 14,000. The patient's blood culture and urine cultures and chest x-ray remained negative.  3. Acute renal failure. The patient came in with creatinine of 3.6, likely due to acute tubular necrosis in the setting with hematuria, hypovolemic shock, and hypotension. The patient was started on CRRT by Dr. Holley Raring. The patient started making good urine. Electrolytes were okay.  Patient's CRRT was discontinued.  4. Acute thrombocytopenia is suspected to be possibly due to heparin along with multiple blood transfusions. He was seen by Dr. Kallie Edward who recommended get HIV panel. Results are still pending.  After discontinuing CRRT, patient's platelet count started trending up, and at discharge, it was 136.  He did not have further episodes of bleeding.  5. Tobacco abuse, counseled on smoking cessation.  6. Alcohol abuse. The patient was monitored on CIWA protocol. He remains stable.  7. Deep vein thrombosis prophylaxis, SCDs.  8. Patient will follow up with Dr.  Bernardo Heater as outpatient.   TIME SPENT: 40 minutes.    ____________________________ Hart Rochester Posey Pronto, MD sap:dd D: 03/02/2014 15:17:00 ET T: 03/02/2014 18:21:14 ET JOB#: [id number]  cc: Taura Lamarre A. Posey Pronto, MD, <Dictator> Ilda Basset MD ELECTRONICALLY SIGNED 03/05/2014 16:25

## 2015-02-03 NOTE — Consult Note (Signed)
Brief Consult Note: Diagnosis: Gross hematuria with clot retention.   Patient was seen by consultant.   Comments: 24 FR 3 way Couvelaire catheter placed-significant clot removed after 4L irrigation.  Cath irrigated until effluent pink.  CBI resumed.  Electronic Signatures: Abbie Sons (MD)  (Signed 15-May-15 13:23)  Authored: Brief Consult Note   Last Updated: 15-May-15 13:23 by Abbie Sons (MD)

## 2015-02-03 NOTE — Consult Note (Signed)
PATIENT NAME:  Johnny Martin, Johnny Martin MR#:  [id number] DATE OF BIRTH:  19-Dec-1950  NEPHROLOGY CONSULTATION   DATE OF CONSULTATION:  02/24/2014  REFERRING PHYSICIAN:  Albertine Patricia, MD CONSULTING PHYSICIAN:  Maite Burlison Lilian Kapur, MD  REASON FOR CONSULTATION: Acute renal failure, severe metabolic acidosis, hematuria.   HISTORY OF PRESENT ILLNESS: The patient is a pleasant 64 year old Caucasian male with past medical history of hypertension, tobacco abuse, alcohol abuse, who presented to [facility] with shortness of breath and weakness. The patient is currently awake and alert, and his daughters are at bedside. The patient presented with shortness of breath and weakness, as above. On initial labs, he was found to be severely anemic with a hemoglobin of 4.7. The patient states that he has been having intermittent hematuria for the past several months. He did not seek care for this until this admission. He does not have a primary care physician at this point in time. He was also found to have acute renal failure with a creatinine of 3.6. Serum bicarbonate was quite low at 6. He also has severe metabolic acidosis with a pH of 7.18, pCO2 less than 19, pO2 of 208. Urinalysis revealed too numerous to count RBCs and 5 to 15 WBCs per high-power field. Blood cultures thus far are negative. The patient denies ingestion of NSAIDs other than Advil, which he took perhaps once a week. The patient denies nausea, vomiting or diarrhea.   PAST MEDICAL HISTORY:  1. Hypertension.  2. Tobacco abuse.  3. Alcohol abuse.   PAST SURGICAL HISTORY: None.   SOCIAL HISTORY: The patient resides in [place] with his daughter. He smokes at least 1 pack of cigarettes per day, but has smoked more heavily in the past. He has extensive history of alcohol abuse as well and drinks alcohol nightly.   FAMILY HISTORY: The patient denies family history of end-stage renal disease.   ALLERGIES: No known drug  allergies.   CURRENT INPATIENT MEDICATIONS: Include:  1. 0.9 normal saline at 125 mL/hour.  2. Banana bag fortified with multivitamin, thiamine and folic acid.  3. Acetaminophen 650 mg p.o. q.4 hours p.r.n.  4. Ativan 2 mg IV q.1 hour p.r.n.  5. Zofran 4 mg IV q.4 hours p.r.n.  6. Protonix 40 mg IV q.12 hours.  7. Ceftriaxone 1 gram IV q.24 hours.   REVIEW OF SYSTEMS:  CONSTITUTIONAL: Denies fevers, chills, but does have malaise.  EYES: Denies diplopia or blurry vision.  HEENT: Denies headaches, hearing loss. Denies epistaxis.  CARDIOVASCULAR: Denies chest pain, palpitations, PND.  RESPIRATORY: Denies cough or hemoptysis, but does endorse shortness of breath.  GASTROINTESTINAL: Denies nausea, vomiting, diarrhea or decreased p.o. intake.  GENITOURINARY: Reports hematuria that has been intermittent in nature for the past several months.  MUSCULOSKELETAL: Denies joint pain, swelling or redness.  INTEGUMENTARY: Denies skin rashes or lesions.  NEUROLOGIC: Denies focal extremity numbness, weakness or tingling.  PSYCHIATRIC: Denies depression, bipolar disorder.  ENDOCRINE: Denies polyuria, polydipsia or polyphagia.  HEMATOLOGIC AND LYMPHATIC: Denies easy bruisability, bleeding or swollen lymph nodes.  ALLERGY AND IMMUNOLOGIC: Denies seasonal allergies or history of immunodeficiency.   PHYSICAL EXAMINATION:  VITAL SIGNS: Temperature 99.9, pulse 114, respirations 31, blood pressure 189/80, pulse oximetry 97% on 2 liters nasal cannula.  GENERAL: Reveals ill-appearing male, currently in no acute distress.  HEENT: Normocephalic, atraumatic. Extraocular movements are intact. Pupils equal, round and reactive to light. No scleral icterus. Conjunctivae are very pale. No epistaxis noted. Gross hearing intact. Oral mucosa dry.  NECK: Supple, without JVD or lymphadenopathy.  LUNGS: Clear to auscultation bilaterally with normal respiratory effort.  CARDIOVASCULAR: S1, S2. The patient noted to be  tachycardic. No murmurs or rubs appreciated.  ABDOMEN: Soft, nontender, nondistended. Bowel sounds positive. No rebound or guarding. No gross organomegaly appreciated.  EXTREMITIES: No clubbing, cyanosis or edema.  NEUROLOGIC: The patient is awake, alert and oriented to time, person and place. Strength is 5 out of 5 in both upper and lower extremities.  GENITOURINARY: No suprapubic tenderness is noted at this time. Foley catheter noted to be in place.  SKIN: Warm and dry, quite pale. No rashes noted.  MUSCULOSKELETAL: No joint redness, swelling or tenderness appreciated.  PSYCHIATRIC: The patient with appropriate affect and appears to have good insight into his current illness.   LABORATORY DATA: Sodium 139, potassium 3.9, chloride 103, CO2 6, BUN 24, creatinine 3.3, glucose 156, total protein 5.1, albumin 2.6, total bilirubin 0.3, alkaline phosphatase 49, AST 20, ALT 18. CBC shows WBC 43, hemoglobin 4.7, hematocrit 15, platelets of 292. INR 1.5. Urinalysis shows too numerous to count RBCs, 5 to 15 WBCs. ABG shows pH of 7.18, pCO2 less than 19, pO2 208, FiO2 100%. Kidney ultrasound shows mild bilateral hydronephrosis. There is also a 12 cm bladder mass noted. CT scan chest, abdomen and pelvis with contrast revealed distended bladder with heterogeneous density, suggesting hemorrhage and possible mass lesions. There were multiple bladder diverticula. Cystoscopy was recommended.   IMPRESSION: This is a 64 year old Caucasian male with past medical history of hypertension, tobacco abuse, alcohol abuse, medication nonadherence, who presented to [facility] with weakness, shortness of breath, and found to have new bladder mass, mild bilateral hydronephrosis, acute renal failure and severe acidosis.   PROBLEM LIST:  1. Acute renal failure, suspect some element of acute tubular necrosis.  2. Mild bilateral hydronephrosis.  3. Bladder mass with gross hematuria.  4. Severe metabolic  acidosis.  5. Severe anemia of blood loss.   PLAN: The patient presents with a very severe illness. He is now found to have mild bilateral hydronephrosis, acute renal failure, bladder mass, gross hematuria, severe metabolic acidosis. Given the severity of illness, at this point in time, we recommend initiating a course of continuous renal replacement therapy, particularly given the acidemia. We will plan a blood flow rate of 250, dialysate flow rate of 2.5 liters/hour with 4 potassium bath. We will monitor serum electrolytes, in particular, serum bicarbonate, quite closely. We agree with urology consultation. Of note, there is mild bilateral hydronephrosis as well. The patient has a Foley catheter in place now. Agree with volume repletion with blood products given the severe anemia. Would monitor his respiratory status closely as he is at risk for respiratory failure given his hyperventilation for the acidemia now.   I would like to thank Dr. Waldron Labs for the referral. Further plan as the patient progresses.   ____________________________ Tama High, MD mnl:lb D: 02/24/2014 09:18:58 ET T: 02/24/2014 09:39:01 ET JOB#: [id number]  cc: Tama High, MD, <Dictator> Mariah Milling Burnett Lieber MD ELECTRONICALLY SIGNED 03/02/2014 9:50

## 2015-02-03 NOTE — Consult Note (Signed)
Chief Complaint:  Subjective/Chief Complaint Urology Consult F/U: Day #2 CBI  Pt without new GU complaints.   VITAL SIGNS/ANCILLARY NOTES: **Vital Signs.:   16-May-15 07:00  Vital Signs Type Routine  Temperature Temperature (F) 97.6  Celsius 36.4  Temperature Source axillary  Pulse Pulse 82  Respirations Respirations 17  Systolic BP Systolic BP 116  Diastolic BP (mmHg) Diastolic BP (mmHg) 81  Mean BP 106  BP Source  if not from Vital Sign Device non-invasive  Pulse Ox % Pulse Ox % 98  Pulse Ox Activity Level  At rest  Oxygen Delivery 2L    09:00  Vital Signs Type Routine  Pulse Pulse 110  Respirations Respirations 24  Systolic BP Systolic BP 579  Diastolic BP (mmHg) Diastolic BP (mmHg) 82  Mean BP 110  Pulse Ox % Pulse Ox % 96  Pulse Ox Activity Level  At rest  Oxygen Delivery 2L  *Intake and Output.:   Shift 16-May-15 15:00  Grand Totals Intake:  3572.5 Output:  3250    Net:  322.5 24 Hr.:  322.5   Brief Assessment:  GEN well developed, well nourished, no acute distress   Respiratory normal resp effort  no use of accessory muscles   Additional Physical Exam Urine light pink, without clots, on moderate CBI   Lab Results: Routine Chem:  16-May-15 03:52   Glucose, Serum 82  BUN  20  Creatinine (comp) 1.11  Sodium, Serum 141  Potassium, Serum 4.1  Chloride, Serum  109  CO2, Serum 27  Calcium (Total), Serum  7.6  Anion Gap  5  Osmolality (calc) 283  eGFR (African American) >60  eGFR (Non-African American) >60 (eGFR values <72m/min/1.73 m2 may be an indication of chronic kidney disease (CKD). Calculated eGFR is useful in patients with stable renal function. The eGFR calculation will not be reliable in acutely ill patients when serum creatinine is changing rapidly. It is not useful in  patients on dialysis. The eGFR calculation may not be applicable to patients at the low and high extremes of body sizes, pregnant women, and vegetarians.)  Magnesium,  Serum 1.8 (1.8-2.4 THERAPEUTIC RANGE: 4-7 mg/dL TOXIC: > 10 mg/dL  -----------------------)    08:03   Glucose, Serum 79  BUN 17  Creatinine (comp) 0.77  Sodium, Serum 141  Potassium, Serum 4.1  Chloride, Serum 107  CO2, Serum 26  Calcium (Total), Serum  7.8  Anion Gap 8  Osmolality (calc) 282  eGFR (African American) >60  eGFR (Non-African American) >60 (eGFR values <674mmin/1.73 m2 may be an indication of chronic kidney disease (CKD). Calculated eGFR is useful in patients with stable renal function. The eGFR calculation will not be reliable in acutely ill patients when serum creatinine is changing rapidly. It is not useful in  patients on dialysis. The eGFR calculation may not be applicable to patients at the low and high extremes of body sizes, pregnant women, and vegetarians.)  Routine Hem:  16-May-15 03:52   WBC (CBC)  12.4  RBC (CBC)  2.78  Hemoglobin (CBC)  8.4  Hematocrit (CBC)  24.8  Platelet Count (CBC)  49  MCV 89  MCH 30.2  MCHC 33.8  RDW  14.8  Neutrophil % 83.4  Lymphocyte % 9.5  Monocyte % 6.0  Eosinophil % 0.2  Basophil % 0.9  Neutrophil #  10.4  Lymphocyte # 1.2  Monocyte # 0.7  Eosinophil # 0.0  Basophil # 0.1 (Result(s) reported on 25 Feb 2014 at 04:24AM.)   Assessment/Plan:  Invasive Device Daily Assessment of Necessity:  Does the patient currently have any of the following indwelling devices? foley   Indwelling Urinary Catheter continued, requirement due to   Reason to continue Indwelling Urinary Catheter bladder irrigation is required   Assessment/Plan:  Assessment 1. Gross Hematuria -stable on NS CBI at a moderate rate (has not required manual irrigation since yesterday) -Hct increased to 24.8 from 15.7 after 4u pRBC's -Plt count down to 49k this morning  2. Blood Loss Anemia -see #1  3. Thrombocytopenia -see #1   Plan 1. Continue Normal Saline CBI to keep urine light pink to clear 2. Consider Platelet Transfusion if  hematuria appears to be increasing. 3. Consider correction of hypocalcemia. 4. Continue close hemodynamic monitoring.   Electronic Signatures: Darcella Cheshire (MD)  (Signed 16-May-15 10:06)  Authored: Chief Complaint, VITAL SIGNS/ANCILLARY NOTES, Brief Assessment, Lab Results, Assessment/Plan   Last Updated: 16-May-15 10:06 by Darcella Cheshire (MD)

## 2015-02-03 NOTE — Op Note (Signed)
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   PROCEDURE: Restaging transurethral resection of bladder tumor.   SURGEON: Scott C. Bernardo Heater, MD  ASSISTANT: None.   ANESTHETIC: General.   INDICATIONS: A 64 year old white male admitted mid May 2015 with a hemoglobin of 4 and a long history of intermittent gross hematuria. He underwent cystoscopy under anesthesia on May 20 with findings of a large right lateral wall bladder tumor which was resected. The pathology returned high-grade transitional cell carcinoma with lamina propria invasion and no evidence of muscle invasion. He presents for a restaging TURBT.   DESCRIPTION OF PROCEDURE: The patient was taken to the operating room and placed on the table in the supine position. A general anesthetic was administered, and he was then placed in the low lithotomy position. His external genitalia were prepped and draped in the usual fashion. A timeout was performed per protocol. The urethral meatus would not accept a 27 French continuous flow resectoscope sheath and was sequentially dilated with [employer] sounds from 20 to 86 [place]. The resectoscope sheath with visual obturator was then easily passed after generous lubrication. The [facility] resectoscope with the loop was then placed in the sheath. Prostate shows mild lateral lobe enlargement. The site of his previous TURBT with some fibrous exudate and mild erythema. No bladder tumor was identified. No other bladder mucosal lesions were identified. There were multiple posterior wall diverticula which were free of tumor. There was some mild mucosal erythema with edema at the edge of one of the larger diverticulum. The site of resection was then re-resected with additional muscle taken. The area at the edge of  the diverticulum with mucosal erythema was resected. The right ureteral orifice was easily identified and it was inferior to the previous resection. All specimen was removed with irrigation. The site was fulgurated with the button electrode. At the conclusion of procedure, all specimen had been removed. Hemostasis was adequate. The resectoscope was removed. A 20 French Foley catheter was placed, with return of clear effluent upon irrigation. A B and O suppository was placed per rectum. He was taken to the PACU in stable condition. There were no complications. EBL minimal.    ____________________________ Ronda Fairly. Bernardo Heater, MD scs:lb D: 03/29/2014 10:37:28 ET T: 03/29/2014 10:50:24 ET JOB#: [id number]  cc: Nicki Reaper C. Bernardo Heater, MD, <Dictator> Abbie Sons MD ELECTRONICALLY SIGNED 04/26/2014 22:12

## 2016-01-12 ENCOUNTER — Emergency Department
Admission: EM | Admit: 2016-01-12 | Discharge: 2016-01-12 | Disposition: A | Discharge status: Home / Self Care | Payer: Self-pay | Attending: Emergency Medicine | Admitting: Emergency Medicine

## 2016-01-12 ENCOUNTER — Encounter: Payer: Self-pay | Admitting: Emergency Medicine

## 2016-01-12 DIAGNOSIS — B029 Zoster without complications: Secondary | ICD-10-CM | POA: Insufficient documentation

## 2016-01-12 DIAGNOSIS — F17200 Nicotine dependence, unspecified, uncomplicated: Secondary | ICD-10-CM | POA: Insufficient documentation

## 2016-01-12 MED ORDER — OXYCODONE-ACETAMINOPHEN 7.5-325 MG PO TABS: 1.0000 | ORAL_TABLET | ORAL | Status: AC | PRN

## 2016-01-12 MED ORDER — OXYCODONE-ACETAMINOPHEN 5-325 MG PO TABS
1.0000 | ORAL_TABLET | Freq: Once | ORAL | Status: AC
  Administered 2016-01-12: 1 via ORAL
  Filled 2016-01-12: qty 1

## 2016-01-12 MED ORDER — ACYCLOVIR 400 MG PO TABS
400.0000 mg | ORAL_TABLET | Freq: Every day | ORAL | Status: DC
Start: 2016-01-12 — End: 2019-07-14

## 2016-01-12 NOTE — ED Notes (Signed)
Discussed discharge instructions, prescriptions, and follow-up care with patient. No questions or concerns at this time. Pt stable at discharge.  

## 2016-01-12 NOTE — Discharge Instructions (Signed)
Shingles Shingles is an infection that causes a painful skin rash and fluid-filled blisters. Shingles is caused by the same virus that causes chickenpox. Shingles only develops in people who:  Have had chickenpox.  Have gotten the chickenpox vaccine. (This is rare.) The first symptoms of shingles may be itching, tingling, or pain in an area on your skin. A rash will follow in a few days or weeks. The rash is usually on one side of the body in a bandlike or beltlike pattern. Over time, the rash turns into fluid-filled blisters that break open, scab over, and dry up. Medicines may:  Help you manage pain.  Help you recover more quickly.  Help to prevent long-term problems. HOME CARE Medicines  Take medicines only as told by your doctor.  Apply an anti-itch or numbing cream to the affected area as told by your doctor. Blister and Rash Care  Take a cool bath or put cool compresses on the area of the rash or blisters as told by your doctor. This may help with pain and itching.  Keep your rash covered with a loose bandage (dressing). Wear loose-fitting clothing.  Keep your rash and blisters clean with mild soap and cool water or as told by your doctor.  Check your rash every day for signs of infection. These include redness, swelling, and pain that lasts or gets worse.  Do not pick your blisters.  Do not scratch your rash. General Instructions  Rest as told by your doctor.  Keep all follow-up visits as told by your doctor. This is important.  Until your blisters scab over, your infection can cause chickenpox in people who have never had it or been vaccinated against it. To prevent this from happening, avoid touching other people or being around other people, especially:  Babies.  Pregnant women.  Children who have eczema.  Elderly people who have transplants.  People who have chronic illnesses, such as leukemia or AIDS. GET HELP IF:  Your pain does not get better with  medicine.  Your pain does not get better after the rash heals.  Your rash looks infected. Signs of infection include:  Redness.  Swelling.  Pain that lasts or gets worse. GET HELP RIGHT AWAY IF:  The rash is on your face or nose.  You have pain in your face, pain around your eye area, or loss of feeling on one side of your face.  You have ear pain or you have ringing in your ear.  You have loss of taste.  Your condition gets worse.   This information is not intended to replace advice given to you by your health care provider. Make sure you discuss any questions you have with your health care provider.   Document Released: 03/17/2008 Document Revised: 10/20/2014 Document Reviewed: 07/11/2014 Elsevier Interactive Patient Education 2016 [employer]  

## 2016-01-12 NOTE — ED Notes (Signed)
Reports painful blisters on right side of back

## 2016-01-12 NOTE — ED Notes (Signed)
Pt to ER with c/o rash to right side, back.  States noted 2 days ago.  States rash is painful.

## 2016-01-12 NOTE — ED Provider Notes (Signed)
[facility] Emergency Department Provider Note  ____________________________________________  Time seen: Approximately 1:30 PM  I have reviewed the triage vital signs and the nursing notes.   HISTORY  Chief Complaint Rash    HPI Johnny Martin is a 65 y.o. male patient complaining of 2 day of painful blisters on the right side of his back does radiate to the right lateral abdomen. Patient denies any fever or chills associated with this complaint. Patient stated no relief taking Tylenol. Patient rates the pain as 8/10. Patient described pain as "burning".   History reviewed. No pertinent past medical history.  There are no active problems to display for this patient.   History reviewed. No pertinent past surgical history.  Current Outpatient Rx  Name  Route  Sig  Dispense  Refill  . acyclovir (ZOVIRAX) 400 MG tablet   Oral   Take 1 tablet (400 mg total) by mouth 5 (five) times daily.   50 tablet   0   . oxyCODONE-acetaminophen (PERCOCET) 7.5-325 MG tablet   Oral   Take 1 tablet by mouth every 4 (four) hours as needed for severe pain.   20 tablet   0     Allergies Review of patient's allergies indicates no known allergies.  History reviewed. No pertinent family history.  Social History Social History  Substance Use Topics  . Smoking status: Current Every Day Smoker  . Smokeless tobacco: None  . Alcohol Use: None    Review of Systems Constitutional: No fever/chills Eyes: No visual changes. ENT: No sore throat. Cardiovascular: Denies chest pain. Respiratory: Denies shortness of breath. Gastrointestinal: No abdominal pain.  No nausea, no vomiting.  No diarrhea.  No constipation. Genitourinary: Negative for dysuria. Musculoskeletal: Negative for back pain. Skin: Positive for rash. Neurological: Negative for headaches, focal weakness or numbness.     ____________________________________________   PHYSICAL EXAM:  VITAL  SIGNS: ED Triage Vitals  Enc Vitals Group     BP 01/12/16 1307 196/92 mmHg     Pulse Rate 01/12/16 1307 103     Resp 01/12/16 1307 20     Temp 01/12/16 1307 97.7 F (36.5 C)     Temp Source 01/12/16 1307 Oral     SpO2 01/12/16 1307 98 %     Weight 01/12/16 1307 160 lb (72.576 kg)     Height 01/12/16 1307 6' (1.829 m)     Head Cir --      Peak Flow --      Pain Score 01/12/16 1309 8     Pain Loc --      Pain Edu? --      Excl. in [place]? --     Constitutional: Alert and oriented. Well appearing and in no acute distress. Eyes: Conjunctivae are normal. PERRL. EOMI. Head: Atraumatic. Nose: No congestion/rhinnorhea. Mouth/Throat: Mucous membranes are moist.  Oropharynx non-erythematous. Neck: No stridor.  No cervical spine tenderness to palpation. Hematological/Lymphatic/Immunilogical: No cervical lymphadenopathy. Cardiovascular: Normal rate, regular rhythm. Grossly normal heart sounds.  Good peripheral circulation.Elevated blood pressure Respiratory: Normal respiratory effort.  No retractions. Lungs CTAB. Gastrointestinal: Soft and nontender. No distention. No abdominal bruits. No CVA tenderness. Musculoskeletal: No lower extremity tenderness nor edema.  No joint effusions. Neurologic:  Normal speech and language. No gross focal neurologic deficits are appreciated. No gait instability. Skin:  Skin is warm, dry and intact. Vesicle lesions lateral right upper back on erythematous base. Psychiatric: Mood and affect are normal. Speech and behavior are normal.  ____________________________________________  LABS (all labs ordered are listed, but only abnormal results are displayed)  Labs Reviewed - No data to display ____________________________________________  EKG   ____________________________________________  RADIOLOGY   ____________________________________________   PROCEDURES  Procedure(s) performed: None  Critical Care performed:  No  ____________________________________________   INITIAL IMPRESSION / ASSESSMENT AND PLAN / ED COURSE  Pertinent labs & imaging results that were available during my care of the patient were reviewed by me and considered in my medical decision making (see chart for details).  Herpes zoster. Patient given discharge Instructions. Patient given prescription for Percocet and acyclovir. Patient advised to follow-up with family doctor to consider immunization was zoster. ____________________________________________   FINAL CLINICAL IMPRESSION(S) / ED DIAGNOSES  Final diagnoses:  Shingles rash      Sable Feil, PA-C 01/12/16 [place], MD 01/12/16 [phone]

## 2019-07-11 ENCOUNTER — Other Ambulatory Visit: Payer: Self-pay

## 2019-07-11 ENCOUNTER — Emergency Department: Payer: Medicare Other

## 2019-07-11 ENCOUNTER — Inpatient Hospital Stay
Admission: EM | Admit: 2019-07-11 | Discharge: 2019-07-14 | DRG: 687 | Disposition: A | Discharge status: Home Health | Payer: Medicare Other | Attending: Internal Medicine | Admitting: Internal Medicine

## 2019-07-11 ENCOUNTER — Encounter: Payer: Self-pay | Admitting: Emergency Medicine

## 2019-07-11 DIAGNOSIS — N131 Hydronephrosis with ureteral stricture, not elsewhere classified: Secondary | ICD-10-CM | POA: Diagnosis present

## 2019-07-11 DIAGNOSIS — R221 Localized swelling, mass and lump, neck: Secondary | ICD-10-CM

## 2019-07-11 DIAGNOSIS — N3289 Other specified disorders of bladder: Secondary | ICD-10-CM | POA: Diagnosis not present

## 2019-07-11 DIAGNOSIS — Z23 Encounter for immunization: Secondary | ICD-10-CM | POA: Diagnosis present

## 2019-07-11 DIAGNOSIS — Z8049 Family history of malignant neoplasm of other genital organs: Secondary | ICD-10-CM | POA: Diagnosis not present

## 2019-07-11 DIAGNOSIS — Z20828 Contact with and (suspected) exposure to other viral communicable diseases: Secondary | ICD-10-CM | POA: Diagnosis present

## 2019-07-11 DIAGNOSIS — D494 Neoplasm of unspecified behavior of bladder: Secondary | ICD-10-CM

## 2019-07-11 DIAGNOSIS — C678 Malignant neoplasm of overlapping sites of bladder: Secondary | ICD-10-CM | POA: Diagnosis present

## 2019-07-11 DIAGNOSIS — R310 Gross hematuria: Secondary | ICD-10-CM

## 2019-07-11 DIAGNOSIS — N1330 Unspecified hydronephrosis: Secondary | ICD-10-CM

## 2019-07-11 DIAGNOSIS — R1031 Right lower quadrant pain: Secondary | ICD-10-CM

## 2019-07-11 DIAGNOSIS — I129 Hypertensive chronic kidney disease with stage 1 through stage 4 chronic kidney disease, or unspecified chronic kidney disease: Secondary | ICD-10-CM | POA: Diagnosis present

## 2019-07-11 DIAGNOSIS — E871 Hypo-osmolality and hyponatremia: Secondary | ICD-10-CM | POA: Diagnosis present

## 2019-07-11 DIAGNOSIS — N132 Hydronephrosis with renal and ureteral calculous obstruction: Secondary | ICD-10-CM | POA: Diagnosis not present

## 2019-07-11 DIAGNOSIS — K668 Other specified disorders of peritoneum: Secondary | ICD-10-CM

## 2019-07-11 DIAGNOSIS — R636 Underweight: Secondary | ICD-10-CM | POA: Diagnosis present

## 2019-07-11 DIAGNOSIS — C786 Secondary malignant neoplasm of retroperitoneum and peritoneum: Secondary | ICD-10-CM | POA: Diagnosis present

## 2019-07-11 DIAGNOSIS — N179 Acute kidney failure, unspecified: Secondary | ICD-10-CM | POA: Diagnosis present

## 2019-07-11 DIAGNOSIS — Z66 Do not resuscitate: Secondary | ICD-10-CM | POA: Diagnosis present

## 2019-07-11 DIAGNOSIS — K5903 Drug induced constipation: Secondary | ICD-10-CM | POA: Diagnosis present

## 2019-07-11 DIAGNOSIS — C7800 Secondary malignant neoplasm of unspecified lung: Secondary | ICD-10-CM | POA: Diagnosis present

## 2019-07-11 DIAGNOSIS — C801 Malignant (primary) neoplasm, unspecified: Secondary | ICD-10-CM

## 2019-07-11 DIAGNOSIS — N138 Other obstructive and reflux uropathy: Secondary | ICD-10-CM | POA: Diagnosis present

## 2019-07-11 DIAGNOSIS — F17210 Nicotine dependence, cigarettes, uncomplicated: Secondary | ICD-10-CM | POA: Diagnosis present

## 2019-07-11 DIAGNOSIS — Z6821 Body mass index (BMI) 21.0-21.9, adult: Secondary | ICD-10-CM | POA: Diagnosis not present

## 2019-07-11 DIAGNOSIS — K219 Gastro-esophageal reflux disease without esophagitis: Secondary | ICD-10-CM | POA: Diagnosis present

## 2019-07-11 DIAGNOSIS — N183 Chronic kidney disease, stage 3 unspecified: Secondary | ICD-10-CM | POA: Diagnosis present

## 2019-07-11 HISTORY — DX: Malignant (primary) neoplasm, unspecified: C80.1

## 2019-07-11 LAB — CBC
HCT: 43.4 % (ref 39.0–52.0)
Hemoglobin: 14.9 g/dL (ref 13.0–17.0)
MCH: 30.2 pg (ref 26.0–34.0)
MCHC: 34.3 g/dL (ref 30.0–36.0)
MCV: 87.9 fL (ref 80.0–100.0)
Platelets: 245 10*3/uL (ref 150–400)
RBC: 4.94 MIL/uL (ref 4.22–5.81)
RDW: 13.2 % (ref 11.5–15.5)
WBC: 12.6 10*3/uL — ABNORMAL HIGH (ref 4.0–10.5)
nRBC: 0 % (ref 0.0–0.2)

## 2019-07-11 LAB — BASIC METABOLIC PANEL
Anion gap: 15 (ref 5–15)
BUN: 23 mg/dL (ref 8–23)
CO2: 23 mmol/L (ref 22–32)
Calcium: 9.2 mg/dL (ref 8.9–10.3)
Chloride: 93 mmol/L — ABNORMAL LOW (ref 98–111)
Creatinine, Ser: 1.55 mg/dL — ABNORMAL HIGH (ref 0.61–1.24)
GFR calc Af Amer: 53 mL/min — ABNORMAL LOW (ref >60)
GFR calc non Af Amer: 46 mL/min — ABNORMAL LOW (ref >60)
Glucose, Bld: 92 mg/dL (ref 70–99)
Potassium: 4.2 mmol/L (ref 3.5–5.1)
Sodium: 131 mmol/L — ABNORMAL LOW (ref 135–145)

## 2019-07-11 LAB — URINALYSIS, COMPLETE (UACMP) WITH MICROSCOPIC
Bacteria, UA: NONE SEEN
Bilirubin Urine: NEGATIVE
Glucose, UA: NEGATIVE mg/dL
Ketones, ur: 5 mg/dL — AB
Leukocytes,Ua: NEGATIVE
Nitrite: NEGATIVE
Protein, ur: 100 mg/dL — AB
RBC / HPF: >50 RBC/hpf — ABNORMAL HIGH (ref 0–5)
RBC / HPF: >50 RBC/hpf — ABNORMAL HIGH (ref 0–5)
Specific Gravity, Urine: 1.016 (ref 1.005–1.030)
Specific Gravity, Urine: 1.027 (ref 1.005–1.030)
WBC, UA: >50 WBC/hpf — ABNORMAL HIGH (ref 0–5)
WBC, UA: >50 WBC/hpf — ABNORMAL HIGH (ref 0–5)
pH: 6 (ref 5.0–8.0)

## 2019-07-11 LAB — SARS CORONAVIRUS 2 BY RT PCR (HOSPITAL ORDER, PERFORMED IN ~~LOC~~ HOSPITAL LAB): SARS Coronavirus 2: NEGATIVE

## 2019-07-11 MED ORDER — OXYCODONE-ACETAMINOPHEN 5-325 MG PO TABS
1.0000 | ORAL_TABLET | Freq: Four times a day (QID) | ORAL | Status: DC | PRN
  Administered 2019-07-11 – 2019-07-13 (×6): 1 via ORAL
  Filled 2019-07-11 (×5): qty 1

## 2019-07-11 MED ORDER — KETOROLAC TROMETHAMINE 30 MG/ML IJ SOLN
30.0000 mg | Freq: Once | INTRAMUSCULAR | Status: AC
  Administered 2019-07-11: 14:00:00 30 mg via INTRAVENOUS

## 2019-07-11 MED ORDER — SODIUM CHLORIDE 0.9 % IV SOLN
INTRAVENOUS | Status: DC
  Administered 2019-07-11 – 2019-07-13 (×3): via INTRAVENOUS

## 2019-07-11 MED ORDER — NICOTINE 21 MG/24HR TD PT24
21.0000 mg | MEDICATED_PATCH | Freq: Once | TRANSDERMAL | Status: AC
  Administered 2019-07-11: 14:00:00 21 mg via TRANSDERMAL
  Filled 2019-07-11: qty 1

## 2019-07-11 MED ORDER — INFLUENZA VAC A&B SA ADJ QUAD 0.5 ML IM PRSY
0.5000 mL | PREFILLED_SYRINGE | INTRAMUSCULAR | Status: AC
  Administered 2019-07-14: 11:00:00 0.5 mL via INTRAMUSCULAR
  Filled 2019-07-11 (×2): qty 0.5

## 2019-07-11 MED ORDER — KETOROLAC TROMETHAMINE 30 MG/ML IJ SOLN
30.0000 mg | Freq: Once | INTRAMUSCULAR | Status: DC
  Filled 2019-07-11: qty 1

## 2019-07-11 MED ORDER — METOPROLOL TARTRATE 25 MG PO TABS
25.0000 mg | ORAL_TABLET | Freq: Two times a day (BID) | ORAL | Status: DC
  Administered 2019-07-11 – 2019-07-14 (×6): 25 mg via ORAL
  Filled 2019-07-11 (×7): qty 1

## 2019-07-11 MED ORDER — DOCUSATE SODIUM 100 MG PO CAPS: 100.0000 mg | ORAL_CAPSULE | Freq: Two times a day (BID) | ORAL | Status: DC | PRN

## 2019-07-11 MED ORDER — SENNOSIDES-DOCUSATE SODIUM 8.6-50 MG PO TABS
1.0000 | ORAL_TABLET | Freq: Two times a day (BID) | ORAL | Status: DC
  Administered 2019-07-11 – 2019-07-13 (×4): 1 via ORAL
  Filled 2019-07-11 (×4): qty 1

## 2019-07-11 MED ORDER — PANTOPRAZOLE SODIUM 40 MG PO TBEC
40.0000 mg | DELAYED_RELEASE_TABLET | Freq: Every day | ORAL | Status: DC
  Administered 2019-07-11 – 2019-07-14 (×4): 40 mg via ORAL
  Filled 2019-07-11 (×4): qty 1

## 2019-07-11 NOTE — ED Notes (Signed)
Oncologist at bedside. 

## 2019-07-11 NOTE — Progress Notes (Signed)
Family Meeting Note  Advance Directive:yes  Today a meeting took place with the Patient and Son-in-law.   The following clinical team members were present during this meeting:MD  The following were discussed:Patient's diagnosis: Bladder cancer with metastasis, hypertension, obstructive uropathy, hydronephrosis, hematuria, Patient's progosis: Unable to determine and Goals for treatment: DNR  Patient made it clear he would like his 2 daughters to make his medical related decisions if he cannot make them.  He would like to be DNR in any adverse event.  Additional follow-up to be provided: Oncology, urology  Time spent during discussion:20 minutes  Vaughan Basta, MD

## 2019-07-11 NOTE — H&P (Signed)
[facility] [place] NAME: Johnny Martin    MR#:  [id number]  DATE OF BIRTH:  13-Sep-1951  DATE OF ADMISSION:  07/11/2019  PRIMARY CARE PHYSICIAN: Patient, No Pcp Per   REQUESTING/REFERRING PHYSICIAN: [email or web address]  CHIEF COMPLAINT:   Chief Complaint  Patient presents with  . Flank Pain    HISTORY OF PRESENT ILLNESS: Willmon Bathurst  is a 68 y.o. male with a known history of bladder cancer treated with minimal surgery 4 years ago.  He does not go for regular checkup to doctors office. He had complained of lower back pain with some blood and blood clots coming out in urine for last 1 to 2 weeks so decided to come to emergency room. CT scan of abdomen shows a large bladder mass with retroperitoneal and lung spread of cancer. ER region suggested to admit to hospitalist for further management.  PAST MEDICAL HISTORY:   Past Medical History:  Diagnosis Date  . Cancer [facility])    Bladder cancer    PAST SURGICAL HISTORY: History reviewed. No pertinent surgical history.  SOCIAL HISTORY:  Social History   Tobacco Use  . Smoking status: Current Every Day Smoker    Packs/day: 0.50  Substance Use Topics  . Alcohol use: Not on file    FAMILY HISTORY:  Family History  Problem Relation Age of Onset  . Cervical cancer Sister     DRUG ALLERGIES: No Known Allergies  REVIEW OF SYSTEMS:   CONSTITUTIONAL: No fever, fatigue or weakness.  EYES: No blurred or double vision.  EARS, NOSE, AND THROAT: No tinnitus or ear pain.  RESPIRATORY: No cough, shortness of breath, wheezing or hemoptysis.  CARDIOVASCULAR: No chest pain, orthopnea, edema.  GASTROINTESTINAL: No nausea, vomiting, diarrhea, have lower abdominal pain.  GENITOURINARY: No dysuria, have intermittent hematuria.  ENDOCRINE: No polyuria, nocturia,  HEMATOLOGY: No anemia, easy bruising or bleeding SKIN: No rash or lesion. MUSCULOSKELETAL: No joint pain or arthritis.   NEUROLOGIC: No tingling,  numbness, weakness.  PSYCHIATRY: No anxiety or depression.   MEDICATIONS AT HOME:  Prior to Admission medications   Medication Sig Start Date End Date Taking? Authorizing Provider  acyclovir (ZOVIRAX) 400 MG tablet Take 1 tablet (400 mg total) by mouth 5 (five) times daily. Patient not taking: Reported on 07/11/2019 01/12/16   Sable Feil, PA-C      PHYSICAL EXAMINATION:   VITAL SIGNS: Blood pressure (!) 180/91, pulse 97, temperature 98 F (36.7 C), temperature source Oral, resp. rate 18, height 6\' 1"  (1.854 m), weight 72.6 kg, SpO2 98 %.  GENERAL:  68 y.o.-year-old patient lying in the bed with no acute distress.  EYES: Pupils equal, round, reactive to light and accommodation. No scleral icterus. Extraocular muscles intact.  HEENT: Head atraumatic, normocephalic. Oropharynx and nasopharynx clear.  NECK:  Supple, no jugular venous distention. No thyroid enlargement, no tenderness.  LUNGS: Normal breath sounds bilaterally, no wheezing, rales,rhonchi or crepitation. No use of accessory muscles of respiration.  CARDIOVASCULAR: S1, S2 normal. No murmurs, rubs, or gallops.  ABDOMEN: Soft, nontender, nondistended. Bowel sounds present. No organomegaly or mass.  EXTREMITIES: No pedal edema, cyanosis, or clubbing.  NEUROLOGIC: Cranial nerves II through XII are intact. Muscle strength 5/5 in all extremities. Sensation intact. Gait not checked.  PSYCHIATRIC: The patient is alert and oriented x 3.  SKIN: No obvious rash, lesion, or ulcer.   LABORATORY PANEL:   CBC Recent Labs  Lab 07/11/19 1130  WBC 12.6*  HGB 14.9  HCT 43.4  PLT 245  MCV 87.9  MCH 30.2  MCHC 34.3  RDW 13.2   ------------------------------------------------------------------------------------------------------------------  Chemistries  Recent Labs  Lab 07/11/19 1130  NA 131*  K 4.2  CL 93*  CO2 23  GLUCOSE 92  BUN 23  CREATININE 1.55*  CALCIUM 9.2    ------------------------------------------------------------------------------------------------------------------ estimated creatinine clearance is 47.5 mL/min (A) (by C-G formula based on SCr of 1.55 mg/dL (H)). ------------------------------------------------------------------------------------------------------------------ No results for input(s): TSH, T4TOTAL, T3FREE, THYROIDAB in the last 72 hours.  Invalid input(s): FREET3   Coagulation profile No results for input(s): INR, PROTIME in the last 168 hours. ------------------------------------------------------------------------------------------------------------------- No results for input(s): DDIMER in the last 72 hours. -------------------------------------------------------------------------------------------------------------------  Cardiac Enzymes No results for input(s): CKMB, TROPONINI, MYOGLOBIN in the last 168 hours.  Invalid input(s): CK ------------------------------------------------------------------------------------------------------------------ Invalid input(s): POCBNP  ---------------------------------------------------------------------------------------------------------------  Urinalysis    Component Value Date/Time   COLORURINE RED (A) 07/11/2019 1345   APPEARANCEUR TURBID (A) 07/11/2019 1345   APPEARANCEUR BLOODY 02/24/2014 0335   LABSPEC 1.027 07/11/2019 1345   LABSPEC 1.040 02/24/2014 0335   PHURINE  07/11/2019 1345    TEST NOT REPORTED DUE TO COLOR INTERFERENCE OF URINE PIGMENT   GLUCOSEU (A) 07/11/2019 1345    TEST NOT REPORTED DUE TO COLOR INTERFERENCE OF URINE PIGMENT   GLUCOSEU see comment 02/24/2014 0335   HGBUR (A) 07/11/2019 1345    TEST NOT REPORTED DUE TO COLOR INTERFERENCE OF URINE PIGMENT   BILIRUBINUR (A) 07/11/2019 1345    TEST NOT REPORTED DUE TO COLOR INTERFERENCE OF URINE PIGMENT   BILIRUBINUR see comment 02/24/2014 0335   KETONESUR (A) 07/11/2019 1345    TEST NOT REPORTED  DUE TO COLOR INTERFERENCE OF URINE PIGMENT   PROTEINUR (A) 07/11/2019 1345    TEST NOT REPORTED DUE TO COLOR INTERFERENCE OF URINE PIGMENT   NITRITE (A) 07/11/2019 1345    TEST NOT REPORTED DUE TO COLOR INTERFERENCE OF URINE PIGMENT   LEUKOCYTESUR (A) 07/11/2019 1345    TEST NOT REPORTED DUE TO COLOR INTERFERENCE OF URINE PIGMENT   LEUKOCYTESUR see comment 02/24/2014 0335     RADIOLOGY: Ct Renal Stone Study  Result Date: 07/11/2019 CLINICAL DATA:  RIGHT flank pain and hematuria for 1 week, question stone disease; history of bladder tumor post TURBT in 2015 EXAM: CT ABDOMEN AND PELVIS WITHOUT CONTRAST TECHNIQUE: Multidetector CT imaging of the abdomen and pelvis was performed following the standard protocol without IV contrast. Sagittal and coronal MPR images reconstructed from axial data set. No oral contrast administered for this indication. COMPARISON:  02/23/2014 FINDINGS: Lower chest: Subpleural nodule at lingula 6 mm diameter image 1, new. Lung bases emphysematous. Additional nodule versus scar posteromedial LEFT lung base 5 mm diameter image 7. Additional nodularity at posterior sulcus of RIGHT lower lobe up to 8 mm diameter. 12 mm lingular nodule image 3, subpleural. Hepatobiliary: Gallbladder and liver grossly normal appearance Pancreas: Normal appearance Spleen: Normal appearance Adrenals/Urinary Tract: Adrenal glands and LEFT kidney normal appearance. RIGHT hydronephrosis and hydroureter secondary to distal ureteral obstruction question ureteral tumor versus bladder tumor. Several bladder diverticular noted. Tiny dependent calculus within a LEFT lateral bladder diverticulum. Soft tissue mass within bladder consistent with neoplasm, 9.6 x 9.2 x 7.0 cm. Stomach/Bowel: Scattered colonic diverticulosis. Stomach unremarkable. Appendix not visualized. No bowel dilatation or evidence of obstruction, see below. Vascular/Lymphatic: Atherosclerotic calcifications aorta and iliac arteries without  aneurysm. Reproductive: Enlarged LEFT external iliac lymph node 19 mm short axis image 67. Upper normal sized RIGHT cardiophrenic angle node  9 mm act short axis. 10 mm node anterior to aorta image 23. Additional upper normal sized aortocaval, para-aortic, and pelvic nodes. Other: Extensive abnormal tumor deposits throughout the abdomen consistent with omental caking and peritoneal carcinomatosis. Largest of these masses measures 16.3 x 6.6 cm. Additional LEFT abdominal mass measures 12.7 x 4.9 cm. Tumor mass invading anterior abdominal wall at medial RIGHT rectus abdominus muscle and umbilicus, measuring 4.1 x 2.9 x 4.8 cm. Small amount of free fluid in the anterior pelvis. No free air. Scattered intra-abdominal collaterals in the anterior abdomen. Musculoskeletal: No acute osseous lesions. 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 Pelvic and retroperitoneal adenopathy. 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 Small amount of ascites. Bibasilar pulmonary nodules question metastases. RIGHT hydronephrosis and hydroureter secondary to distal ureteral obstruction likely by tumor. Findings called to Dr. Corky Downs on 07/11/2019 at 1349 hours. Electronically Signed   By: Lavonia Dana M.D.   On: 07/11/2019 13:51    EKG: Orders placed or performed in visit on 12/20/05  . EKG 12-Lead    IMPRESSION AND PLAN:  *Bladder mass with retroperitoneal and lung metastasis Hydronephrosis Acute renal failure likely due to obstruction Hematuria  This is likely recurrence of his bladder cancer. Causing obstructive nephropathy now. I have contacted oncology and urology for consult. They suggested to get radiologist involvement for percutaneous nephrostomy tube placement.  I placed a IR consult. Currently I will treat with pain management and stool softeners as  needed. Given IV fluid and monitor hemoglobin. Avoid anticoagulations at this time due to hematuria  He may need to have palliative care involvement for further discussion after meeting with oncologist.   *Hypertension Partially it could be due to his pain. I will start on metoprolol.  *Active smoking Counseled to quit smoking for 4 minutes and offered nicotine patch.   All the records are reviewed and case discussed with ED provider. Management plans discussed with the patient, family and they are in agreement.  CODE STATUS: DNR  Patient's son-in-law was present in the room during my visit.  TOTAL TIME TAKING CARE OF THIS PATIENT: 45 minutes.    Vaughan Basta M.D on 07/11/2019   Between 7am to 6pm - Pager - [phone]  After 6pm go to [email or web address] - password [email or web address] [facility]  Office  [phone]  CC: Primary care physician; Patient, No Pcp Per   Note: This dictation was prepared with Dragon dictation along with smaller phrase technology. Any transcriptional errors that result from this process are unintentional.

## 2019-07-11 NOTE — Consult Note (Addendum)
I have been asked to see the patient by Dr. Vaughan Basta, for evaluation and management of recurrent bladder cancer and right-sided hydronephrosis.  History of present illness: 68 year old male who presented to the emergency department with right-sided flank pain and gross hematuria.  A CT scan in the emergency department demonstrated right hydroureteronephrosis and a large bladder tumor.  He was also noted to have large peritoneal metastatic deposits as well as lung nodules.  The patient has a history of high-grade non-muscle invasive bladder cancer diagnosed in 2015 by Dr. Bernardo Heater.  The patient did not receive any adjuvant BCG and was lost to follow-up.  In the interim the patient has noted intermittent gross hematuria that seem to stop on its own.  This led the patient to believe that there was no problem.  The patient denies any chronic fatigue, night sweats, weight loss, or significant GI complaints.  Currently, the patient seems to be comfortable with no significant complaints of abdominal pain or flank pain.  There is no catheter in place and he seems to be voiding on his own.  Review of systems: A 12 point comprehensive review of systems was obtained and is negative unless otherwise stated in the history of present illness.  Patient Active Problem List   Diagnosis Date Noted  . Bladder mass 07/11/2019  . Hydronephrosis 07/11/2019    No current facility-administered medications on file prior to encounter.    Current Outpatient Medications on File Prior to Encounter  Medication Sig Dispense Refill  . acyclovir (ZOVIRAX) 400 MG tablet Take 1 tablet (400 mg total) by mouth 5 (five) times daily. (Patient not taking: Reported on 07/11/2019) 50 tablet 0    Past Medical History:  Diagnosis Date  . Cancer [facility])    Bladder cancer    History reviewed. No pertinent surgical history.  Social History   Tobacco Use  . Smoking status: Current Every Day Smoker    Packs/day: 0.50   Substance Use Topics  . Alcohol use: Not on file  . Drug use: Not on file    Family History  Problem Relation Age of Onset  . Cervical cancer Sister     PE: Vitals:   07/11/19 1119 07/11/19 1120 07/11/19 1634 07/11/19 1638  BP: (!) 188/96  (!) 180/91 (!) 180/91  Pulse: (!) 101  97 97  Resp: 19   18  Temp: 98 F (36.7 C)     TempSrc: Oral     SpO2: 98%   98%  Weight:  72.6 kg    Height:  6\' 1"  (1.854 m)     Patient appears to be in no acute distress  patient is alert and oriented x3 Atraumatic normocephalic head No cervical or supraclavicular lymphadenopathy appreciated No increased work of breathing, no audible wheezes/rhonchi Regular sinus rhythm/rate Abdomen is soft, nontender, nondistended, mild right CVA tenderness and mild abdominal tenderness Lower extremities are symmetric without appreciable edema Grossly neurologically intact No identifiable skin lesions  Recent Labs    07/11/19 1130  WBC 12.6*  HGB 14.9  HCT 43.4   Recent Labs    07/11/19 1130  NA 131*  K 4.2  CL 93*  CO2 23  GLUCOSE 92  BUN 23  CREATININE 1.55*  CALCIUM 9.2   No results for input(s): LABPT, INR in the last 72 hours. No results for input(s): LABURIN in the last 72 hours. Results for orders placed or performed during the hospital encounter of 07/11/19  SARS Coronavirus 2 [facility] order, Performed in  [facility] hospital lab) Nasopharyngeal Nasopharyngeal Swab     Status: None   Collection Time: 07/11/19  2:43 PM   Specimen: Nasopharyngeal Swab  Result Value Ref Range Status   SARS Coronavirus 2 NEGATIVE NEGATIVE Final    Comment: (NOTE) If result is NEGATIVE SARS-CoV-2 target nucleic acids are NOT DETECTED. The SARS-CoV-2 RNA is generally detectable in upper and lower  respiratory specimens during the acute phase of infection. The lowest  concentration of SARS-CoV-2 viral copies this assay can detect is 250  copies / mL. A negative result does not preclude SARS-CoV-2  infection  and should not be used as the sole basis for treatment or other  patient management decisions.  A negative result may occur with  improper specimen collection / handling, submission of specimen other  than nasopharyngeal swab, presence of viral mutation(s) within the  areas targeted by this assay, and inadequate number of viral copies  (<250 copies / mL). A negative result must be combined with clinical  observations, patient history, and epidemiological information. If result is POSITIVE SARS-CoV-2 target nucleic acids are DETECTED. The SARS-CoV-2 RNA is generally detectable in upper and lower  respiratory specimens dur ing the acute phase of infection.  Positive  results are indicative of active infection with SARS-CoV-2.  Clinical  correlation with patient history and other diagnostic information is  necessary to determine patient infection status.  Positive results do  not rule out bacterial infection or co-infection with other viruses. If result is PRESUMPTIVE POSTIVE SARS-CoV-2 nucleic acids MAY BE PRESENT.   A presumptive positive result was obtained on the submitted specimen  and confirmed on repeat testing.  While 2019 novel coronavirus  (SARS-CoV-2) nucleic acids may be present in the submitted sample  additional confirmatory testing may be necessary for epidemiological  and / or clinical management purposes  to differentiate between  SARS-CoV-2 and other Sarbecovirus currently known to infect humans.  If clinically indicated additional testing with an alternate test  methodology [phone]) is advised. The SARS-CoV-2 RNA is generally  detectable in upper and lower respiratory sp ecimens during the acute  phase of infection. The expected result is Negative. Fact Sheet for Patients:  StrictlyIdeas.no Fact Sheet for Healthcare Providers: BankingDealers.co.za This test is not yet approved or cleared by the [place]  FDA and has been authorized for detection and/or diagnosis of SARS-CoV-2 by FDA under an Emergency Use Authorization (EUA).  This EUA will remain in effect (meaning this test can be used) for the duration of the COVID-19 declaration under Section 564(b)(1) of the Act, 21 U.S.C. section 360bbb-3(b)(1), unless the authorization is terminated or revoked sooner. Performed at [facility], [address] [place], [place] [address]     Imaging: I reviewed the patient's CT scan and went over it with the patient's son-in-law and the patient himself.  He has right hydroureteronephrosis down to the bladder.  The patient has a 9 cm bladder tumor which is completely obstructing the right ureter.  There seems to be also extension of the bladder tumor through the bladder wall on the right side around the ureter.  In addition, the patient has a 16 cm anterior abdominal wall mass as well as a 12 cm right lateral wall mass.  He has small lung nodules as well.  Imp: The patient has metastatic bladder cancer with the primary tumor within the patient's bladder causing right hydroureteronephrosis.  Fortunately, the patient's pain seems to be fairly well controlled.  Further, he does have  some acute renal insufficiency, but he is not having any trouble with anemia.  He appears to be in fairly good condition otherwise.  The extent of the patient's cancer portends a poor prognosis.  However, he may be a candidate for systemic chemotherapy which may shrink the tumors and provide him some better quality of life.  Given the size of the tumor in his bladder, this to me would be unresectable.  Recommendations: I would recommend that the patient have a nephrostomy tube placed in the right kidney.  Eventually he will likely need a left-sided nephrostomy tube but currently his left kidney and collecting system appear unobstructed.  The nephrostomy tube can be placed tomorrow morning.  He can have dinner tonight and  then made n.p.o. past midnight for the procedure.  I will let Dr. Bernardo Heater know that he is in the hospital and we will have urology check on him daily.   Ardis Hughs

## 2019-07-11 NOTE — ED Provider Notes (Signed)
[facility] Emergency Department Provider Note   ____________________________________________    I have reviewed the triage vital signs and the nursing notes.   HISTORY  Chief Complaint Flank Pain     HPI Johnny Martin is a 68 y.o. male who presents with complaints of right back pain/right flank pain for approximately 1 week.  Patient reports pain started as an aching pain 1 week ago, he did notice some hematuria intermittently over the last week as well.  Does apparently have a history of significant gross hematuria in the past but that has not been an issue more recently.  Denies nausea or vomiting.  Has taken Tylenol which does help with the pain.  He reports the pain occasionally crosses the midline of the back to the left side.  Occasionally radiates into his right flank.  History reviewed. No pertinent past medical history.  There are no active problems to display for this patient.   History reviewed. No pertinent surgical history.  Prior to Admission medications   Medication Sig Start Date End Date Taking? Authorizing Provider  acyclovir (ZOVIRAX) 400 MG tablet Take 1 tablet (400 mg total) by mouth 5 (five) times daily. 01/12/16   Sable Feil, PA-C     Allergies Patient has no known allergies.  No family history on file.  Social History Social History   Tobacco Use  . Smoking status: Current Every Day Smoker  Substance Use Topics  . Alcohol use: Not on file  . Drug use: Not on file    Review of Systems  Constitutional: No fever/chills Eyes: No visual changes.  ENT: No sore throat. Cardiovascular: Denies chest pain. Respiratory: Denies shortness of breath. Gastrointestinal: As above Genitourinary: As above, no dysuria Musculoskeletal: Negative for back pain. Skin: Negative for rash. Neurological: Negative for headache   ____________________________________________   PHYSICAL EXAM:  VITAL SIGNS: ED Triage Vitals   Enc Vitals Group     BP 07/11/19 1119 (!) 188/96     Pulse Rate 07/11/19 1119 (!) 101     Resp 07/11/19 1119 19     Temp 07/11/19 1119 98 F (36.7 C)     Temp Source 07/11/19 1119 Oral     SpO2 07/11/19 1119 98 %     Weight 07/11/19 1120 72.6 kg (160 lb)     Height 07/11/19 1120 1.854 m (6\' 1" )     Head Circumference --      Peak Flow --      Pain Score 07/11/19 1129 7     Pain Loc --      Pain Edu? --      Excl. in [place]? --     Constitutional: Alert and oriented.  Eyes: Conjunctivae are normal.    Mouth/Throat: Mucous membranes are moist.    Cardiovascular: Normal rate, regular rhythm. Grossly normal heart sounds.  Good peripheral circulation. Respiratory: Normal respiratory effort.  No retractions. Lungs CTAB. Gastrointestinal: Soft and nontender. No distention.  Mild right CVA tenderness  Musculoskeletal:   Warm and well perfused Neurologic:  Normal speech and language. No gross focal neurologic deficits are appreciated.  Skin:  Skin is warm, dry and intact. No rash noted. Psychiatric: Mood and affect are normal. Speech and behavior are normal.  ____________________________________________   LABS (all labs ordered are listed, but only abnormal results are displayed)  Labs Reviewed  URINALYSIS, COMPLETE (UACMP) WITH MICROSCOPIC - Abnormal; Notable for the following components:      Result Value  Color, Urine RED (*)    APPearance CLOUDY (*)    Hgb urine dipstick LARGE (*)    Ketones, ur 5 (*)    Protein, ur 100 (*)    RBC / HPF >50 (*)    WBC, UA >50 (*)    Bacteria, UA MANY (*)    All other components within normal limits  BASIC METABOLIC PANEL - Abnormal; Notable for the following components:   Sodium 131 (*)    Chloride 93 (*)    Creatinine, Ser 1.55 (*)    GFR calc non Af Amer 46 (*)    GFR calc Af Amer 53 (*)    All other components within normal limits  CBC - Abnormal; Notable for the following components:   WBC 12.6 (*)    All other components  within normal limits  SARS CORONAVIRUS 2 (HOSPITAL ORDER, [place])  URINALYSIS, COMPLETE (UACMP) WITH MICROSCOPIC   ____________________________________________  EKG  None ____________________________________________  RADIOLOGY  CT renal stone study ____________________________________________   PROCEDURES  Procedure(s) performed: No  Procedures   Critical Care performed: No ____________________________________________   INITIAL IMPRESSION / ASSESSMENT AND PLAN / ED COURSE  Pertinent labs & imaging results that were available during my care of the patient were reviewed by me and considered in my medical decision making (see chart for details).  Patient presents with primarily right lower back pain which occasionally crosses to the left lower back.  He does describe some radiation into the right flank as well and has had some hematuria.  Urinalysis appears contaminated but significant amount of blood, possible ureterolithiasis versus musculoskeletal pain.  Will obtain CT renal stone study, give IM Toradol and reevaluate.  Notified by radiology of large bladder tumor with peritoneal carcinomatosis, hydronephrosis of the right kidney likely related to obstruction of the ureter secondary to the mass.  Review of medical records demonstrates in 2017 patient had hematuria found to be caused by high-grade bladder CA which was resected  Consulted Dr. Tasia Catchings urology, recommends admission with urology consultation    ____________________________________________   FINAL CLINICAL IMPRESSION(S) / ED DIAGNOSES  Final diagnoses:  Bladder tumor  Hydronephrosis due to obstruction of ureter  Gross hematuria  Peritoneal carcinomatosis Johnny Martin)        Note:  This document was prepared using Dragon voice recognition software and may include unintentional dictation errors.   [name], MD 07/11/19 [phone]

## 2019-07-11 NOTE — Consult Note (Signed)
Hematology/Oncology Consult note [facility] Telephone:([phone] [phone]  Patient Care Team: Patient, No Pcp Per as PCP - General (General Practice)   Name of the patient: Johnny Zachar  NP:[id number]  1950-12-12   Date of visit: 07/11/19 REASON FOR COSULTATION:  Evaluation of bladder mass. History of presenting illness-  68 y.o. male with PMH listed at below who presents to ER for evaluation of right flank pain, also with intermittent passing blood/blood clots in the urine for the past few weeks. 07/11/2019 CT renal stone study showed large bladder neoplasm approximately 9.6 x 9.2 x 7 cm, bladder diverticula, pelvic and retroperitoneal adenopathy, extensive peritoneal carcinomatosis with significant omental caking/tumor masses in the abdomen with invasion of anterior abdominal wall at umbilicus, small amount of ascites.  Bibasilar pulmonary nodules questionable metastasis.  Right hydronephrosis and hydroureter secondary to distal ureteral obstruction likely by tumor.  Patient also reports having poor appetite recently, + heartburn.  Denies any shortness of breath, chest pain. Also mentioned noticing left neck mass for a few weeks, nontender. Heavy smoker Son-in-law Johnny Martin is at the bedside.  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  Patient lost to follow-up  Review of Systems  Constitutional: Positive for appetite change, fatigue and unexpected weight change. Negative for chills and fever.  HENT:   Negative for hearing loss and voice change.        Left neck mass  Eyes: Negative for eye problems and icterus.  Respiratory: Negative for chest tightness, cough and shortness of breath.   Cardiovascular: Negative for chest pain and leg swelling.  Gastrointestinal: Negative for abdominal distention and abdominal pain.       Heartburn  Endocrine: Negative for hot flashes.  Genitourinary: Positive for  hematuria. Negative for difficulty urinating, dysuria and frequency.        Flank pain  Musculoskeletal: Negative for arthralgias.  Skin: Negative for itching and rash.  Neurological: Negative for light-headedness and numbness.  Hematological: Negative for adenopathy. Does not bruise/bleed easily.  Psychiatric/Behavioral: Negative for confusion.    No Known Allergies  Patient Active Problem List   Diagnosis Date Noted   Bladder mass 07/11/2019   Hydronephrosis 07/11/2019     Past Medical History:  Diagnosis Date   Cancer [facility])    Bladder cancer     Past Surgical History:  Procedure Laterality Date   BLADDER SURGERY      Social History   Socioeconomic History   Marital status: Divorced    Spouse name: Not on file   Number of children: Not on file   Years of education: Not on file   Highest education level: Not on file  Occupational History   Not on file  Social Needs   Financial resource strain: Not on file   Food insecurity    Worry: Not on file    Inability: Not on file   Transportation needs    Medical: Not on file    Non-medical: Not on file  Tobacco Use   Smoking status: Current Every Day Smoker    Packs/day: 0.50   Smokeless tobacco: Never Used  Substance and Sexual Activity   Alcohol use: Never    Frequency: Never   Drug use: Never   Sexual activity: Not on file  Lifestyle   Physical activity    Days per week: Not on file    Minutes per session: Not on file   Stress: Not on file  Relationships  Social Herbalist on phone: Not on file    Gets together: Not on file    Attends religious service: Not on file    Active member of club or organization: Not on file    Attends meetings of clubs or organizations: Not on file    Relationship status: Not on file   Intimate partner violence    Fear of current or ex partner: Not on file    Emotionally abused: Not on file    Physically abused: Not on file    Forced  sexual activity: Not on file  Other Topics Concern   Not on file  Social History Narrative   Lives at home with daughter and son in [place].     Family History  Problem Relation Age of Onset   Cervical cancer Sister      Current Facility-Administered Medications:    0.9 %  sodium chloride infusion, , Intravenous, Continuous, Vaughan Basta, MD, Last Rate: 75 mL/hr at 07/11/19 2138   docusate sodium (COLACE) capsule 100 mg, 100 mg, Oral, BID PRN, Vaughan Basta, MD   [START ON 07/12/2019] influenza vaccine adjuvanted (FLUAD) injection 0.5 mL, 0.5 mL, Intramuscular, Tomorrow-1000, Vaughan Basta, MD   metoprolol tartrate (LOPRESSOR) tablet 25 mg, 25 mg, Oral, BID, Vaughan Basta, MD, 25 mg at 07/11/19 1634   nicotine (NICODERM CQ - dosed in mg/24 hours) patch 21 mg, 21 mg, Transdermal, Once, [name], MD, 21 mg at 07/11/19 1400   oxyCODONE-acetaminophen (PERCOCET/ROXICET) 5-325 MG per tablet 1 tablet, 1 tablet, Oral, Q6H PRN, Vaughan Basta, MD   pantoprazole (PROTONIX) EC tablet 40 mg, 40 mg, Oral, Daily, Vaughan Basta, MD, 40 mg at 07/11/19 1800   senna-docusate (Senokot-S) tablet 1 tablet, 1 tablet, Oral, BID, Vaughan Basta, MD, 1 tablet at 07/11/19 2039   Physical exam: ECOG  Vitals:   07/11/19 1634 07/11/19 1638 07/11/19 1806 07/11/19 2010  BP: (!) 180/91 (!) 180/91 (!) 165/95 135/68  Pulse: 97 97 (!) 101 81  Resp:  18 17 18   Temp:   97.8 F (36.6 C) 98.8 F (37.1 C)  TempSrc:   Oral Oral  SpO2:  98% 98% 99%  Weight:      Height:       Physical Exam      CMP Latest Ref Rng & Units 07/11/2019  Glucose 70 - 99 mg/dL 92  BUN 8 - 23 mg/dL 23  Creatinine 0.61 - 1.24 mg/dL 1.55(H)  Sodium 135 - 145 mmol/L 131(L)  Potassium 3.5 - 5.1 mmol/L 4.2  Chloride 98 - 111 mmol/L 93(L)  CO2 22 - 32 mmol/L 23  Calcium 8.9 - 10.3 mg/dL 9.2  Total Protein 6.4 - 8.2 g/dL -  Total Bilirubin 0.2 - 1.0 mg/dL -    Alkaline Phos Unit/L -  AST 15 - 37 Unit/L -  ALT 12 - 78 U/L -   CBC Latest Ref Rng & Units 07/11/2019  WBC 4.0 - 10.5 K/uL 12.6(H)  Hemoglobin 13.0 - 17.0 g/dL 14.9  Hematocrit 39.0 - 52.0 % 43.4  Platelets 150 - 400 K/uL 245   RADIOGRAPHIC STUDIES: I have personally reviewed the radiological images as listed and agreed with the findings in the report.  Ct Renal Stone Study  Result Date: 07/11/2019 CLINICAL DATA:  RIGHT flank pain and hematuria for 1 week, question stone disease; history of bladder tumor post TURBT in 2015 EXAM: CT ABDOMEN AND PELVIS WITHOUT CONTRAST TECHNIQUE: Multidetector CT imaging of the abdomen and pelvis  was performed following the standard protocol without IV contrast. Sagittal and coronal MPR images reconstructed from axial data set. No oral contrast administered for this indication. COMPARISON:  02/23/2014 FINDINGS: Lower chest: Subpleural nodule at lingula 6 mm diameter image 1, new. Lung bases emphysematous. Additional nodule versus scar posteromedial LEFT lung base 5 mm diameter image 7. Additional nodularity at posterior sulcus of RIGHT lower lobe up to 8 mm diameter. 12 mm lingular nodule image 3, subpleural. Hepatobiliary: Gallbladder and liver grossly normal appearance Pancreas: Normal appearance Spleen: Normal appearance Adrenals/Urinary Tract: Adrenal glands and LEFT kidney normal appearance. RIGHT hydronephrosis and hydroureter secondary to distal ureteral obstruction question ureteral tumor versus bladder tumor. Several bladder diverticular noted. Tiny dependent calculus within a LEFT lateral bladder diverticulum. Soft tissue mass within bladder consistent with neoplasm, 9.6 x 9.2 x 7.0 cm. Stomach/Bowel: Scattered colonic diverticulosis. Stomach unremarkable. Appendix not visualized. No bowel dilatation or evidence of obstruction, see below. Vascular/Lymphatic: Atherosclerotic calcifications aorta and iliac arteries without aneurysm. Reproductive: Enlarged  LEFT external iliac lymph node 19 mm short axis image 67. Upper normal sized RIGHT cardiophrenic angle node 9 mm act short axis. 10 mm node anterior to aorta image 23. Additional upper normal sized aortocaval, para-aortic, and pelvic nodes. Other: Extensive abnormal tumor deposits throughout the abdomen consistent with omental caking and peritoneal carcinomatosis. Largest of these masses measures 16.3 x 6.6 cm. Additional LEFT abdominal mass measures 12.7 x 4.9 cm. Tumor mass invading anterior abdominal wall at medial RIGHT rectus abdominus muscle and umbilicus, measuring 4.1 x 2.9 x 4.8 cm. Small amount of free fluid in the anterior pelvis. No free air. Scattered intra-abdominal collaterals in the anterior abdomen. Musculoskeletal: No acute osseous lesions. 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 Pelvic and retroperitoneal adenopathy. 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 Small amount of ascites. Bibasilar pulmonary nodules question metastases. RIGHT hydronephrosis and hydroureter secondary to distal ureteral obstruction likely by tumor. Findings called to Dr. Corky Downs on 07/11/2019 at 1349 hours. Electronically Signed   By: Lavonia Dana M.D.   On: 07/11/2019 13:51    Assessment and plan- Patient is a 68 y.o. male with a history of noninvasive bladder cancer status post resection present for evaluation of flank pain and hematuria.  CT findings concerning for widely metastatic bladder cancer.  #Bladder mass, with extensive peritoneal carcinomatosis, pelvic and retroperitoneal adenopathy, right hydronephrosis Likely metastatic bladder cancer. Recommend obtaining tissue for biopsy. AKI, likely post renal due to tumor invasion of ureter.  Recommend urology consultation for evaluation of stenting. If urology plan cystoscopy, tissue diagnosis can be  obtained by biopsying bladder. Otherwise recommend CT-guided omentum biopsy to establish diagnosis. Discussed with patient that systemic chemotherapy will be recommended if metastatic bladder cancer is confirmed.  #Left neck mass, unclear if this is related to his metastatic bladder cancer or a separate issue. Recommend patient to have outpatient PET scan for staging.  May consider biopsy of the left neck mass for clarification. Patient is a smoker, at risk for head and neck cancer.  # Hematuria, normal hemoglobin, check LFT, PT, PTT.   #GERD, on PPI    Thank you for allowing me to participate in the care of this patient.  Total face to face encounter time for this patient visit was 70 min. >50% of the time was  spent in counseling and coordination of care.    Earlie Server, MD,  PhD Hematology [place] [facility] Pager- [phone] 07/11/2019

## 2019-07-11 NOTE — ED Triage Notes (Signed)
R flank pain x 1 week.

## 2019-07-11 NOTE — Plan of Care (Signed)

## 2019-07-12 ENCOUNTER — Inpatient Hospital Stay: Payer: Medicare Other

## 2019-07-12 ENCOUNTER — Other Ambulatory Visit: Payer: Medicare Other

## 2019-07-12 ENCOUNTER — Encounter: Payer: Self-pay | Admitting: *Deleted

## 2019-07-12 DIAGNOSIS — N132 Hydronephrosis with renal and ureteral calculous obstruction: Secondary | ICD-10-CM

## 2019-07-12 LAB — HEPATIC FUNCTION PANEL
ALT: 13 U/L (ref 0–44)
AST: 23 U/L (ref 15–41)
Albumin: 3.1 g/dL — ABNORMAL LOW (ref 3.5–5.0)
Alkaline Phosphatase: 66 U/L (ref 38–126)
Bilirubin, Direct: 0.1 mg/dL (ref 0.0–0.2)
Indirect Bilirubin: 0.8 mg/dL (ref 0.3–0.9)
Total Bilirubin: 0.9 mg/dL (ref 0.3–1.2)
Total Protein: 6.1 g/dL — ABNORMAL LOW (ref 6.5–8.1)

## 2019-07-12 LAB — APTT: aPTT: 29 seconds (ref 24–36)

## 2019-07-12 LAB — BASIC METABOLIC PANEL
Anion gap: 10 (ref 5–15)
BUN: 25 mg/dL — ABNORMAL HIGH (ref 8–23)
CO2: 24 mmol/L (ref 22–32)
Calcium: 8.4 mg/dL — ABNORMAL LOW (ref 8.9–10.3)
Chloride: 96 mmol/L — ABNORMAL LOW (ref 98–111)
Creatinine, Ser: 1.49 mg/dL — ABNORMAL HIGH (ref 0.61–1.24)
GFR calc Af Amer: 55 mL/min — ABNORMAL LOW (ref >60)
GFR calc non Af Amer: 48 mL/min — ABNORMAL LOW (ref >60)
Glucose, Bld: 94 mg/dL (ref 70–99)
Potassium: 4.5 mmol/L (ref 3.5–5.1)
Sodium: 130 mmol/L — ABNORMAL LOW (ref 135–145)

## 2019-07-12 LAB — CBC
HCT: 37.7 % — ABNORMAL LOW (ref 39.0–52.0)
Hemoglobin: 12.7 g/dL — ABNORMAL LOW (ref 13.0–17.0)
MCH: 29.6 pg (ref 26.0–34.0)
MCHC: 33.7 g/dL (ref 30.0–36.0)
MCV: 87.9 fL (ref 80.0–100.0)
Platelets: 189 10*3/uL (ref 150–400)
RBC: 4.29 MIL/uL (ref 4.22–5.81)
RDW: 13.3 % (ref 11.5–15.5)
WBC: 9.2 10*3/uL (ref 4.0–10.5)
nRBC: 0 % (ref 0.0–0.2)

## 2019-07-12 LAB — PROTIME-INR
INR: 1 (ref 0.8–1.2)
Prothrombin Time: 12.6 seconds (ref 11.4–15.2)

## 2019-07-12 MED ORDER — FENTANYL CITRATE (PF) 100 MCG/2ML IJ SOLN
INTRAMUSCULAR | Status: AC
  Administered 2019-07-12: 11:00:00
  Filled 2019-07-12: qty 2

## 2019-07-12 MED ORDER — NICOTINE 21 MG/24HR TD PT24
21.0000 mg | MEDICATED_PATCH | Freq: Every day | TRANSDERMAL | Status: DC
  Administered 2019-07-12 – 2019-07-13 (×2): 21 mg via TRANSDERMAL
  Filled 2019-07-12 (×2): qty 1

## 2019-07-12 MED ORDER — FENTANYL CITRATE (PF) 100 MCG/2ML IJ SOLN
INTRAMUSCULAR | Status: AC | PRN
  Administered 2019-07-12: 25 ug via INTRAVENOUS
  Administered 2019-07-13: 11:00:00 via INTRAVENOUS

## 2019-07-12 MED ORDER — SODIUM CHLORIDE 0.9% FLUSH
5.0000 mL | Freq: Three times a day (TID) | INTRAVENOUS | Status: DC
  Administered 2019-07-12 – 2019-07-14 (×5): 5 mL

## 2019-07-12 MED ORDER — CEFAZOLIN SODIUM-DEXTROSE 1-4 GM/50ML-% IV SOLN
1.0000 g | Freq: Once | INTRAVENOUS | Status: AC
  Administered 2019-07-12: 1 g via INTRAVENOUS

## 2019-07-12 MED ORDER — MIDAZOLAM HCL 2 MG/2ML IJ SOLN
INTRAMUSCULAR | Status: AC | PRN
  Administered 2019-07-12: 1 mg via INTRAVENOUS

## 2019-07-12 MED ORDER — MIDAZOLAM HCL 2 MG/2ML IJ SOLN
INTRAMUSCULAR | Status: AC
  Administered 2019-07-12: 11:00:00
  Filled 2019-07-12: qty 2

## 2019-07-12 NOTE — Progress Notes (Signed)
Patient remains clinically stable post Right Nephrostomy tube placement per Dr Register. Vitals have remained stable. Tolerated procedure well. Dressing to neph tube dry and intact, draining urine immediately upon placement. Denies complaints at this time. Received Versed 1mg  along with Fentanyl 29mcg IV for procedure. Alert and oriented post procedure. Vitals stable.

## 2019-07-12 NOTE — Progress Notes (Signed)
Patient ID: Johnny Martin, male   DOB: 05-20-1951, 68 y.o.   MRN: [id number]  [facility] PROGRESS NOTE  SHERI HOTTINGER [id number] DOB: 10/22/1950 DOA: 07/11/2019 PCP: Patient, No Pcp Per  HPI/Subjective: Patient seen this morning and was having some discomfort in his right back area.  He states he has been urinating better and its the blood has cleared up a little bit.  No abdominal pain.  Objective: Vitals:   07/12/19 1200 07/12/19 1215  BP: 132/76 134/72  Pulse: 79 77  Resp: (!) 25 (!) 24  Temp:    SpO2:  95%    Intake/Output Summary (Last 24 hours) at 07/12/2019 1407 Last data filed at 07/12/2019 1300 Gross per 24 hour  Intake 1333.71 ml  Output 1125 ml  Net 208.71 ml   Filed Weights   07/11/19 1120  Weight: 72.6 kg    ROS: Review of Systems  Constitutional: Negative for chills and fever.  Eyes: Negative for blurred vision.  Respiratory: Negative for cough and shortness of breath.   Cardiovascular: Negative for chest pain.  Gastrointestinal: Negative for abdominal pain, constipation, diarrhea, nausea and vomiting.  Genitourinary: Negative for dysuria.  Musculoskeletal: Positive for back pain. Negative for joint pain.  Neurological: Negative for dizziness and headaches.   Exam: Physical Exam  Constitutional: He is oriented to person, place, and time.  HENT:  Nose: No mucosal edema.  Mouth/Throat: No oropharyngeal exudate or posterior oropharyngeal edema.  Eyes: Pupils are equal, round, and reactive to light. Conjunctivae, EOM and lids are normal.  Neck: No JVD present. Carotid bruit is not present. No edema present. No thyroid mass and no thyromegaly present.  Cardiovascular: S1 normal and S2 normal. Exam reveals no gallop.  No murmur heard. Pulses:      Dorsalis pedis pulses are 2+ on the right side and 2+ on the left side.  Respiratory: No respiratory distress. He has no wheezes. He has no rhonchi. He has no rales.  GI: Soft. Bowel sounds are normal.  There is no abdominal tenderness.  Musculoskeletal:     Right ankle: He exhibits no swelling.     Left ankle: He exhibits no swelling.  Lymphadenopathy:    He has no cervical adenopathy.  Neurological: He is alert and oriented to person, place, and time. No cranial nerve deficit.  Skin: Skin is warm. No rash noted. Nails show no clubbing.  Psychiatric: He has a normal mood and affect.      Data Reviewed: Basic Metabolic Panel: Recent Labs  Lab 07/11/19 1130 07/12/19 0417  NA 131* 130*  K 4.2 4.5  CL 93* 96*  CO2 23 24  GLUCOSE 92 94  BUN 23 25*  CREATININE 1.55* 1.49*  CALCIUM 9.2 8.4*   Liver Function Tests: Recent Labs  Lab 07/12/19 0417  AST 23  ALT 13  ALKPHOS 66  BILITOT 0.9  PROT 6.1*  ALBUMIN 3.1*   CBC: Recent Labs  Lab 07/11/19 1130 07/12/19 0417  WBC 12.6* 9.2  HGB 14.9 12.7*  HCT 43.4 37.7*  MCV 87.9 87.9  PLT 245 189     Recent Results (from the past 240 hour(s))  SARS Coronavirus 2 [facility] order, Performed in [facility] hospital lab) Nasopharyngeal Nasopharyngeal Swab     Status: None   Collection Time: 07/11/19  2:43 PM   Specimen: Nasopharyngeal Swab  Result Value Ref Range Status   SARS Coronavirus 2 NEGATIVE NEGATIVE Final    Comment: (NOTE) If result is NEGATIVE SARS-CoV-2 target  nucleic acids are NOT DETECTED. The SARS-CoV-2 RNA is generally detectable in upper and lower  respiratory specimens during the acute phase of infection. The lowest  concentration of SARS-CoV-2 viral copies this assay can detect is 250  copies / mL. A negative result does not preclude SARS-CoV-2 infection  and should not be used as the sole basis for treatment or other  patient management decisions.  A negative result may occur with  improper specimen collection / handling, submission of specimen other  than nasopharyngeal swab, presence of viral mutation(s) within the  areas targeted by this assay, and inadequate number of viral copies  (<250 copies  / mL). A negative result must be combined with clinical  observations, patient history, and epidemiological information. If result is POSITIVE SARS-CoV-2 target nucleic acids are DETECTED. The SARS-CoV-2 RNA is generally detectable in upper and lower  respiratory specimens dur ing the acute phase of infection.  Positive  results are indicative of active infection with SARS-CoV-2.  Clinical  correlation with patient history and other diagnostic information is  necessary to determine patient infection status.  Positive results do  not rule out bacterial infection or co-infection with other viruses. If result is PRESUMPTIVE POSTIVE SARS-CoV-2 nucleic acids MAY BE PRESENT.   A presumptive positive result was obtained on the submitted specimen  and confirmed on repeat testing.  While 2019 novel coronavirus  (SARS-CoV-2) nucleic acids may be present in the submitted sample  additional confirmatory testing may be necessary for epidemiological  and / or clinical management purposes  to differentiate between  SARS-CoV-2 and other Sarbecovirus currently known to infect humans.  If clinically indicated additional testing with an alternate test  methodology [id number]) is advised. The SARS-CoV-2 RNA is generally  detectable in upper and lower respiratory sp ecimens during the acute  phase of infection. The expected result is Negative. Fact Sheet for Patients:  StrictlyIdeas.no Fact Sheet for Healthcare Providers: BankingDealers.co.za This test is not yet approved or cleared by the [place] FDA and has been authorized for detection and/or diagnosis of SARS-CoV-2 by FDA under an Emergency Use Authorization (EUA).  This EUA will remain in effect (meaning this test can be used) for the duration of the COVID-19 declaration under Section 564(b)(1) of the Act, 21 U.S.C. section 360bbb-3(b)(1), unless the authorization is terminated or revoked  sooner. Performed at [facility], [address], [place], [place] [address]      Studies: Ct Abdomen Wo Contrast  Result Date: 07/12/2019 CLINICAL DATA:  Right hydronephrosis. EXAM: CT ABDOMEN WITHOUT CONTRAST TECHNIQUE: Multidetector CT imaging of the abdomen was performed following the standard protocol without IV contrast. COMPARISON:  Prior recent CT. FINDINGS: Limited CT was obtained in order to determine access for right percutaneous nephrostomy. Right hydronephrosis again identified. Reference is made to percutaneous nephrostomy report. IMPRESSION: Limited CT was obtained in order to determine access for right percutaneous nephrostomy. Reference is made to percutaneous nephrostomy report. Electronically Signed   By: Marcello Moores  Register   On: 07/12/2019 12:20   Ct Renal Stone Study  Result Date: 07/11/2019 CLINICAL DATA:  RIGHT flank pain and hematuria for 1 week, question stone disease; history of bladder tumor post TURBT in 2015 EXAM: CT ABDOMEN AND PELVIS WITHOUT CONTRAST TECHNIQUE: Multidetector CT imaging of the abdomen and pelvis was performed following the standard protocol without IV contrast. Sagittal and coronal MPR images reconstructed from axial data set. No oral contrast administered for this indication. COMPARISON:  02/23/2014 FINDINGS: Lower chest: Subpleural nodule at  lingula 6 mm diameter image 1, new. Lung bases emphysematous. Additional nodule versus scar posteromedial LEFT lung base 5 mm diameter image 7. Additional nodularity at posterior sulcus of RIGHT lower lobe up to 8 mm diameter. 12 mm lingular nodule image 3, subpleural. Hepatobiliary: Gallbladder and liver grossly normal appearance Pancreas: Normal appearance Spleen: Normal appearance Adrenals/Urinary Tract: Adrenal glands and LEFT kidney normal appearance. RIGHT hydronephrosis and hydroureter secondary to distal ureteral obstruction question ureteral tumor versus bladder tumor. Several bladder diverticular  noted. Tiny dependent calculus within a LEFT lateral bladder diverticulum. Soft tissue mass within bladder consistent with neoplasm, 9.6 x 9.2 x 7.0 cm. Stomach/Bowel: Scattered colonic diverticulosis. Stomach unremarkable. Appendix not visualized. No bowel dilatation or evidence of obstruction, see below. Vascular/Lymphatic: Atherosclerotic calcifications aorta and iliac arteries without aneurysm. Reproductive: Enlarged LEFT external iliac lymph node 19 mm short axis image 67. Upper normal sized RIGHT cardiophrenic angle node 9 mm act short axis. 10 mm node anterior to aorta image 23. Additional upper normal sized aortocaval, para-aortic, and pelvic nodes. Other: Extensive abnormal tumor deposits throughout the abdomen consistent with omental caking and peritoneal carcinomatosis. Largest of these masses measures 16.3 x 6.6 cm. Additional LEFT abdominal mass measures 12.7 x 4.9 cm. Tumor mass invading anterior abdominal wall at medial RIGHT rectus abdominus muscle and umbilicus, measuring 4.1 x 2.9 x 4.8 cm. Small amount of free fluid in the anterior pelvis. No free air. Scattered intra-abdominal collaterals in the anterior abdomen. Musculoskeletal: No acute osseous lesions. 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 Pelvic and retroperitoneal adenopathy. 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 Small amount of ascites. Bibasilar pulmonary nodules question metastases. RIGHT hydronephrosis and hydroureter secondary to distal ureteral obstruction likely by tumor. Findings called to Dr. Corky Downs on 07/11/2019 at 1349 hours. Electronically Signed   By: Lavonia Dana M.D.   On: 07/11/2019 13:51   Ct Image Guided Fluid Drain By Catheter  Result Date: 07/12/2019 INDICATION: Right hydronephrosis secondary to bladder mass. EXAM: Right percutaneous nephrostomy.  MEDICATIONS: The patient is currently admitted to the hospital. 1 g of Ancef administered. The antibiotics were administered within an appropriate time frame prior to the initiation of the procedure. ANESTHESIA/SEDATION: Fentanyl 2.0 mcg IV; Versed 25 mg IV Moderate Sedation Time:  Approximately 30 minutes The patient was continuously monitored during the procedure by the interventional radiology nurse under my direct supervision. COMPLICATIONS: None PROCEDURE: Informed written consent was obtained from the patient after a thorough discussion of the procedural risks, benefits and alternatives. All questions were addressed. Maximal Sterile Barrier Technique was utilized including caps, mask, sterile gowns, sterile gloves, sterile drape, hand hygiene and skin antiseptic. A timeout was performed prior to the initiation of the procedure. Following sterile preparation patient and CT of the patient in prone position, local anesthesia was administered 1% lidocaine. This followed by placement 18 gauge needle into a posterior right renal calyx. This followed by placement of an Amplatz extra stiff wire. This is followed by placement of an 8 French percutaneous drainage catheter. Clear urine obtained. Percutaneous nephrostomy catheter was sutured in place and placed to bag drainage. IMPRESSION: Successful right percutaneous nephrostomy. Electronically Signed   By: Marcello Moores  Register   On: 07/12/2019 12:18    Scheduled Meds: . influenza vaccine adjuvanted  0.5 mL Intramuscular Tomorrow-1000  . metoprolol tartrate  25 mg Oral BID  . pantoprazole  40 mg Oral Daily  .  senna-docusate  1 tablet Oral BID  . sodium chloride flush  5 mL Intracatheter Q8H   Continuous Infusions: . sodium chloride 75 mL/hr at 07/12/19 1300    Assessment/Plan:  1. 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  Appreciate oncology consultation.  Oncology wanted a tissue biopsy.  Spoke with urology and they did not have any plans for  cystoscopy.  Will order CT-guided biopsy by interventional radiology for tomorrow morning.  N.p.o. after midnight.  Patient will have follow-up with oncology as outpatient.  Mentioned that any treatment would be palliative in nature trying to slow things down. 2. Hydronephrosis and hydroureter on the right.  Nephrostomy tube placed today by interventional radiology. 3. Acute kidney injury.  Gentle IV fluid hydration.  Nephrostomy tube should help.  Code Status:     Code Status Orders  (From admission, onward)         Start     Ordered   07/11/19 1700  Do not attempt resuscitation (DNR)  Continuous    Question Answer Comment  In the event of cardiac or respiratory ARREST Do not call a "code blue"   In the event of cardiac or respiratory ARREST Do not perform Intubation, CPR, defibrillation or ACLS   In the event of cardiac or respiratory ARREST Use medication by any route, position, wound care, and other measures to relive pain and suffering. May use oxygen, suction and manual treatment of airway obstruction as needed for comfort.      07/11/19 1659        Code Status History    This patient has a current code status but no historical code status.   Advance Care Planning Activity     Family Communication: Spoke with the patient's Sister Mariann Laster Disposition Plan: Potential disposition after biopsy tomorrow versus Thursday.  Consultants:  Oncology  Urology  Interventional radiology  Procedures:  Right nephrostomy tube  Time spent: 30 minutes in coordination of care speaking with urology and interventional radiology.  Also trying to reach oncology.  Calisha Tindel [employer]

## 2019-07-12 NOTE — Progress Notes (Signed)
Hematology/Oncology Progress Note [facility] Telephone:([phone] [phone]  Patient Care Team: Patient, No Pcp Per as PCP - General (General Practice)   Name of the patient: Johnny Martin  [id number]  1951-01-15  Date of visit: 07/12/19   INTERVAL HISTORY-  Patient was seen at bedside. S/p right percutaneous nephrostomy. Reports pain is controlled. Abdomen still sore.  Feeling better.      Review of systems- Review of Systems  Constitutional: Positive for appetite change, fatigue and unexpected weight change.  HENT:         Firm Neck mass   Respiratory: Negative for cough and shortness of breath.   Cardiovascular: Negative for chest pain and leg swelling.  Gastrointestinal: Positive for abdominal pain.  Genitourinary: Positive for hematuria.   Musculoskeletal: Negative for arthralgias.  Skin: Negative for itching.  Hematological: Negative for adenopathy.  Psychiatric/Behavioral: Negative for confusion. The patient is not nervous/anxious.     No Known Allergies  Patient Active Problem List   Diagnosis Date Noted   Bladder mass 07/11/2019   Hydronephrosis 07/11/2019   Gross hematuria    Peritoneal carcinomatosis (HCC)    Mass of left side of neck    AKI (acute kidney injury) ([place])      Past Medical History:  Diagnosis Date   Cancer [facility])    Bladder cancer     Past Surgical History:  Procedure Laterality Date   BLADDER SURGERY      Social History   Socioeconomic History   Marital status: Divorced    Spouse name: Not on file   Number of children: Not on file   Years of education: Not on file   Highest education level: Not on file  Occupational History   Not on file  Social Needs   Financial resource strain: Not on file   Food insecurity    Worry: Not on file    Inability: Not on file   Transportation needs    Medical: Not on file    Non-medical: Not on file  Tobacco Use   Smoking status:  Current Every Day Smoker    Packs/day: 0.50   Smokeless tobacco: Never Used  Substance and Sexual Activity   Alcohol use: Never    Frequency: Never   Drug use: Never   Sexual activity: Not on file  Lifestyle   Physical activity    Days per week: Not on file    Minutes per session: Not on file   Stress: Not on file  Relationships   Social connections    Talks on phone: Not on file    Gets together: Not on file    Attends religious service: Not on file    Active member of club or organization: Not on file    Attends meetings of clubs or organizations: Not on file    Relationship status: Not on file   Intimate partner violence    Fear of current or ex partner: Not on file    Emotionally abused: Not on file    Physically abused: Not on file    Forced sexual activity: Not on file  Other Topics Concern   Not on file  Social History Narrative   Lives at home with daughter and son in [employer].     Family History  Problem Relation Age of Onset   Cervical cancer Sister      Current Facility-Administered Medications:    0.9 %  sodium chloride infusion, , Intravenous, Continuous, Loletha Grayer, MD,  Last Rate: 30 mL/hr at 07/12/19 1600   docusate sodium (COLACE) capsule 100 mg, 100 mg, Oral, BID PRN, Vaughan Basta, MD   influenza vaccine adjuvanted (FLUAD) injection 0.5 mL, 0.5 mL, Intramuscular, Tomorrow-1000, Vaughan Basta, MD   metoprolol tartrate (LOPRESSOR) tablet 25 mg, 25 mg, Oral, BID, Vaughan Basta, MD, 25 mg at 07/12/19 0939   oxyCODONE-acetaminophen (PERCOCET/ROXICET) 5-325 MG per tablet 1 tablet, 1 tablet, Oral, Q6H PRN, Vaughan Basta, MD, 1 tablet at 07/12/19 1255   pantoprazole (PROTONIX) EC tablet 40 mg, 40 mg, Oral, Daily, Vaughan Basta, MD, 40 mg at 07/12/19 [id number]   senna-docusate (Senokot-S) tablet 1 tablet, 1 tablet, Oral, BID, Vaughan Basta, MD, 1 tablet at 07/12/19 0939   sodium chloride flush  (NS) 0.9 % injection 5 mL, 5 mL, Intracatheter, Q8H, Register, Anola Gurney., MD   Physical exam:  Vitals:   07/12/19 1145 07/12/19 1200 07/12/19 1215 07/12/19 1517  BP: 109/68 132/76 134/72 134/67  Pulse: 81 79 77 84  Resp: (!) 23 (!) 25 (!) 24 18  Temp:    98.3 F (36.8 C)  TempSrc:      SpO2: 98%  95% 94%  Weight:      Height:       Physical Exam  Constitutional: He is oriented to person, place, and time. No distress.  HENT:  Head: Normocephalic and atraumatic.  Mouth/Throat: No oropharyngeal exudate.  Eyes: No scleral icterus.  Neck: Normal range of motion.  Palpable left neck mass, firm.   Cardiovascular: Normal rate and regular rhythm.  Pulmonary/Chest: Effort normal. No respiratory distress.  Decreased breath sounds.   Abdominal: Soft. He exhibits no distension.  s/p percutaneous nephrostomy on the right side. Nephrostomy bag with bloody urine  Musculoskeletal: Normal range of motion.  Neurological: He is alert and oriented to person, place, and time.  Skin: Skin is warm.  Psychiatric: Affect normal.       CMP Latest Ref Rng & Units 07/12/2019  Glucose 70 - 99 mg/dL 94  BUN 8 - 23 mg/dL 25(H)  Creatinine 0.61 - 1.24 mg/dL 1.49(H)  Sodium 135 - 145 mmol/L 130(L)  Potassium 3.5 - 5.1 mmol/L 4.5  Chloride 98 - 111 mmol/L 96(L)  CO2 22 - 32 mmol/L 24  Calcium 8.9 - 10.3 mg/dL 8.4(L)  Total Protein 6.5 - 8.1 g/dL 6.1(L)  Total Bilirubin 0.3 - 1.2 mg/dL 0.9  Alkaline Phos 38 - 126 U/L 66  AST 15 - 41 U/L 23  ALT 0 - 44 U/L 13   CBC Latest Ref Rng & Units 07/12/2019  WBC 4.0 - 10.5 K/uL 9.2  Hemoglobin 13.0 - 17.0 g/dL 12.7(L)  Hematocrit 39.0 - 52.0 % 37.7(L)  Platelets 150 - 400 K/uL 189    RADIOGRAPHIC STUDIES: I have personally reviewed the radiological images as listed and agreed with the findings in the report. Ct Abdomen Wo Contrast  Result Date: 07/12/2019 CLINICAL DATA:  Right hydronephrosis. EXAM: CT ABDOMEN WITHOUT CONTRAST TECHNIQUE:  Multidetector CT imaging of the abdomen was performed following the standard protocol without IV contrast. COMPARISON:  Prior recent CT. FINDINGS: Limited CT was obtained in order to determine access for right percutaneous nephrostomy. Right hydronephrosis again identified. Reference is made to percutaneous nephrostomy report. IMPRESSION: Limited CT was obtained in order to determine access for right percutaneous nephrostomy. Reference is made to percutaneous nephrostomy report. Electronically Signed   By: Marcello Moores  Register   On: 07/12/2019 12:20   Ct Renal Stone Study  Result Date: 07/11/2019 CLINICAL DATA:  RIGHT flank pain and hematuria for 1 week, question stone disease; history of bladder tumor post TURBT in 2015 EXAM: CT ABDOMEN AND PELVIS WITHOUT CONTRAST TECHNIQUE: Multidetector CT imaging of the abdomen and pelvis was performed following the standard protocol without IV contrast. Sagittal and coronal MPR images reconstructed from axial data set. No oral contrast administered for this indication. COMPARISON:  02/23/2014 FINDINGS: Lower chest: Subpleural nodule at lingula 6 mm diameter image 1, new. Lung bases emphysematous. Additional nodule versus scar posteromedial LEFT lung base 5 mm diameter image 7. Additional nodularity at posterior sulcus of RIGHT lower lobe up to 8 mm diameter. 12 mm lingular nodule image 3, subpleural. Hepatobiliary: Gallbladder and liver grossly normal appearance Pancreas: Normal appearance Spleen: Normal appearance Adrenals/Urinary Tract: Adrenal glands and LEFT kidney normal appearance. RIGHT hydronephrosis and hydroureter secondary to distal ureteral obstruction question ureteral tumor versus bladder tumor. Several bladder diverticular noted. Tiny dependent calculus within a LEFT lateral bladder diverticulum. Soft tissue mass within bladder consistent with neoplasm, 9.6 x 9.2 x 7.0 cm. Stomach/Bowel: Scattered colonic diverticulosis. Stomach unremarkable. Appendix not  visualized. No bowel dilatation or evidence of obstruction, see below. Vascular/Lymphatic: Atherosclerotic calcifications aorta and iliac arteries without aneurysm. Reproductive: Enlarged LEFT external iliac lymph node 19 mm short axis image 67. Upper normal sized RIGHT cardiophrenic angle node 9 mm act short axis. 10 mm node anterior to aorta image 23. Additional upper normal sized aortocaval, para-aortic, and pelvic nodes. Other: Extensive abnormal tumor deposits throughout the abdomen consistent with omental caking and peritoneal carcinomatosis. Largest of these masses measures 16.3 x 6.6 cm. Additional LEFT abdominal mass measures 12.7 x 4.9 cm. Tumor mass invading anterior abdominal wall at medial RIGHT rectus abdominus muscle and umbilicus, measuring 4.1 x 2.9 x 4.8 cm. Small amount of free fluid in the anterior pelvis. No free air. Scattered intra-abdominal collaterals in the anterior abdomen. Musculoskeletal: No acute osseous lesions. 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 Pelvic and retroperitoneal adenopathy. 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 Small amount of ascites. Bibasilar pulmonary nodules question metastases. RIGHT hydronephrosis and hydroureter secondary to distal ureteral obstruction likely by tumor. Findings called to Dr. Corky Downs on 07/11/2019 at 1349 hours. Electronically Signed   By: Lavonia Dana M.D.   On: 07/11/2019 13:51   Ct Image Guided Fluid Drain By Catheter  Result Date: 07/12/2019 INDICATION: Right hydronephrosis secondary to bladder mass. EXAM: Right percutaneous nephrostomy. MEDICATIONS: The patient is currently admitted to the hospital. 1 g of Ancef administered. The antibiotics were administered within an appropriate time frame prior to the initiation of the procedure. ANESTHESIA/SEDATION: Fentanyl 2.0 mcg IV;  Versed 25 mg IV Moderate Sedation Time:  Approximately 30 minutes The patient was continuously monitored during the procedure by the interventional radiology nurse under my direct supervision. COMPLICATIONS: None PROCEDURE: Informed written consent was obtained from the patient after a thorough discussion of the procedural risks, benefits and alternatives. All questions were addressed. Maximal Sterile Barrier Technique was utilized including caps, mask, sterile gowns, sterile gloves, sterile drape, hand hygiene and skin antiseptic. A timeout was performed prior to the initiation of the procedure. Following sterile preparation patient and CT of the patient in prone position, local anesthesia was administered 1% lidocaine. This followed by placement 18 gauge needle into a posterior right renal calyx. This followed by placement of an Amplatz extra stiff wire. This is followed by placement of  an 8 [place] percutaneous drainage catheter. Clear urine obtained. Percutaneous nephrostomy catheter was sutured in place and placed to bag drainage. IMPRESSION: Successful right percutaneous nephrostomy. Electronically Signed   By: Marcello Moores  Register   On: 07/12/2019 12:18    Assessment and plan-  Patient is a 68 y.o. male with a history of noninvasive bladder cancer status post resection present for evaluation of flank pain and hematuria.  CT findings concerning for widely metastatic bladder cancer.  #Bladder mass, with extensive peritoneal carcinomatosis, pelvic and retroperitoneal adenopathy, right hydronephrosis Likely metastatic bladder cancer. Recommend obtaining tissue for biopsy.  Recommend CT-guided biopsy of the omentum. Discussed with patient that systemic chemotherapy will be recommended if metastatic bladder cancer is confirmed.  #AKI /post renal obstruction due to tumor invasion of ureter, Status post percutaneous nephrostomy tube placement. Creatinine is trending down.  Continue IV fluid.  #Left neck  mass, not typical for metastatic disease.  Concerning for a second primary, i.e., head and neck cancer. Will obtain PET scan outpatient for staging.  Plan outpatient biopsy the left neck mass.  # Hematuria, hemoglobin trending down slightly.  Continue to monitor. #Hyponatremia, sodium is 130.  Questionable SIADH. #GERD, on PPI I called patient's daughter [name] ([id number]) and updated her.  Discussed with Dr. Leslye Peer via secure chat.   Thank you for allowing me to participate in the care of this patient.  We spent sufficient time to discuss many aspect of care, questions were answered to patient and her daughter's satisfaction. Total face to face encounter time for this patient visit was 35 min. >50% of the time was  spent in counseling and coordination of care.   Earlie Server, MD, PhD Hematology Oncology [facility] Pager- [phone] 07/12/2019

## 2019-07-12 NOTE — Progress Notes (Signed)
Report called  To Christina RN/care nurse on 1C with questions answered regarding procedure.

## 2019-07-12 NOTE — Progress Notes (Signed)
Urology Inpatient Progress Note  Subjective: Johnny Martin is a 68 y.o. male who presented to the ED yesterday with complaints of right flank pain and intermittent gross hematuria.  CT with >9 cm bladder mass, pelvic and retroperitoneal adenopathy, multiple intra-abdominal masses consistent with peritoneal carcinomatosis, and bibasilar pulmonary nodules possibly representing metastasis.  Also with right hydroureteronephrosis secondary to distal right ureteral compression likely due to bladder tumor.  Plans for IR to place right PCN today.  Two UAs drawn since admission, likely contaminated. Urine culture pending.  Creatinine 1.49 today, WBCs 9.2. On cefazolin as below.  Patient is afebrile with intermittent tachypnea and variable BP between 100s/50s and 140s/70s.  Patient reports gross hematuria as recently as this morning. He reports pain is limited to his right flank.  Anti-infectives: Anti-infectives (From admission, onward)   Start     Dose/Rate Route Frequency Ordered Stop   07/12/19 1145  ceFAZolin (ANCEF) IVPB 1 g/50 mL premix     1 g 100 mL/hr over 30 Minutes Intravenous  Once 07/12/19 1136       Current Facility-Administered Medications  Medication Dose Route Frequency Provider Last Rate Last Dose  . 0.9 %  sodium chloride infusion   Intravenous Continuous Vaughan Basta, MD 75 mL/hr at 07/12/19 0538    . ceFAZolin (ANCEF) IVPB 1 g/50 mL premix  1 g Intravenous Once Register, Anola Gurney., MD 100 mL/hr at 07/12/19 1137 1 g at 07/12/19 1137  . docusate sodium (COLACE) capsule 100 mg  100 mg Oral BID PRN Vaughan Basta, MD      . fentaNYL (SUBLIMAZE) 100 MCG/2ML injection           . fentaNYL (SUBLIMAZE) injection   Intravenous PRN Register, Anola Gurney., MD   25 mcg at 07/12/19 1126  . influenza vaccine adjuvanted (FLUAD) injection 0.5 mL  0.5 mL Intramuscular Tomorrow-1000 Vaughan Basta, MD      . metoprolol tartrate (LOPRESSOR) tablet 25 mg  25 mg Oral  BID Vaughan Basta, MD   25 mg at 07/12/19 0939  . midazolam (VERSED) 2 MG/2ML injection           . midazolam (VERSED) injection   Intravenous PRN Register, Anola Gurney., MD   1 mg at 07/12/19 1126  . nicotine (NICODERM CQ - dosed in mg/24 hours) patch 21 mg  21 mg Transdermal Once [name], MD   21 mg at 07/11/19 1400  . oxyCODONE-acetaminophen (PERCOCET/ROXICET) 5-325 MG per tablet 1 tablet  1 tablet Oral Q6H PRN Vaughan Basta, MD   1 tablet at 07/12/19 0517  . pantoprazole (PROTONIX) EC tablet 40 mg  40 mg Oral Daily Vaughan Basta, MD   40 mg at 07/12/19 0939  . senna-docusate (Senokot-S) tablet 1 tablet  1 tablet Oral BID Vaughan Basta, MD   1 tablet at 07/12/19 0939   Objective: Vital signs in last 24 hours: Temp:  [97.8 F (36.6 C)-98.8 F (37.1 C)] 98.6 F (37 C) (09/29 0404) Pulse Rate:  [78-101] 81 (09/29 1145) Resp:  [16-23] 23 (09/29 1145) BP: (108-180)/(54-95) 109/68 (09/29 1145) SpO2:  [96 %-99 %] 98 % (09/29 1145)  Intake/Output from previous day: 09/28 0701 - 09/29 0700 In: 854.7 [I.V.:854.7] Out: 500 [Urine:500] Intake/Output this shift: Total I/O In: -  Out: 200 [Urine:200]  Physical Exam Vitals signs and nursing note reviewed.  Constitutional:      General: He is not in acute distress.    Appearance: He is underweight. He is not ill-appearing, toxic-appearing or  diaphoretic.  HENT:     Head: Normocephalic and atraumatic.  Pulmonary:     Effort: Pulmonary effort is normal. No respiratory distress.  Abdominal:     General: Abdomen is flat. There is no distension.     Palpations: Abdomen is soft. There is mass (Midline lower abdomen).     Tenderness: There is no abdominal tenderness. There is no right CVA tenderness, left CVA tenderness, guarding or rebound.     Hernia: No hernia is present.  Lymphadenopathy:     Comments: Left neck mass, possibly consistent with enlarged lymph node. Mass is approximately 6cm in length,  fixed, nontender, and firm.  Skin:    General: Skin is warm and dry.  Neurological:     Mental Status: He is alert and oriented to person, place, and time.  Psychiatric:        Mood and Affect: Mood normal.        Behavior: Behavior normal.    Lab Results:  Recent Labs    07/11/19 1130 07/12/19 0417  WBC 12.6* 9.2  HGB 14.9 12.7*  HCT 43.4 37.7*  PLT 245 189   BMET Recent Labs    07/11/19 1130 07/12/19 0417  NA 131* 130*  K 4.2 4.5  CL 93* 96*  CO2 23 24  GLUCOSE 92 94  BUN 23 25*  CREATININE 1.55* 1.49*  CALCIUM 9.2 8.4*   PT/INR Recent Labs    07/12/19 0417  LABPROT 12.6  INR 1.0   Studies/Results: Ct Renal Stone Study  Result Date: 07/11/2019 CLINICAL DATA:  RIGHT flank pain and hematuria for 1 week, question stone disease; history of bladder tumor post TURBT in 2015 EXAM: CT ABDOMEN AND PELVIS WITHOUT CONTRAST TECHNIQUE: Multidetector CT imaging of the abdomen and pelvis was performed following the standard protocol without IV contrast. Sagittal and coronal MPR images reconstructed from axial data set. No oral contrast administered for this indication. COMPARISON:  02/23/2014 FINDINGS: Lower chest: Subpleural nodule at lingula 6 mm diameter image 1, new. Lung bases emphysematous. Additional nodule versus scar posteromedial LEFT lung base 5 mm diameter image 7. Additional nodularity at posterior sulcus of RIGHT lower lobe up to 8 mm diameter. 12 mm lingular nodule image 3, subpleural. Hepatobiliary: Gallbladder and liver grossly normal appearance Pancreas: Normal appearance Spleen: Normal appearance Adrenals/Urinary Tract: Adrenal glands and LEFT kidney normal appearance. RIGHT hydronephrosis and hydroureter secondary to distal ureteral obstruction question ureteral tumor versus bladder tumor. Several bladder diverticular noted. Tiny dependent calculus within a LEFT lateral bladder diverticulum. Soft tissue mass within bladder consistent with neoplasm, 9.6 x 9.2 x 7.0  cm. Stomach/Bowel: Scattered colonic diverticulosis. Stomach unremarkable. Appendix not visualized. No bowel dilatation or evidence of obstruction, see below. Vascular/Lymphatic: Atherosclerotic calcifications aorta and iliac arteries without aneurysm. Reproductive: Enlarged LEFT external iliac lymph node 19 mm short axis image 67. Upper normal sized RIGHT cardiophrenic angle node 9 mm act short axis. 10 mm node anterior to aorta image 23. Additional upper normal sized aortocaval, para-aortic, and pelvic nodes. Other: Extensive abnormal tumor deposits throughout the abdomen consistent with omental caking and peritoneal carcinomatosis. Largest of these masses measures 16.3 x 6.6 cm. Additional LEFT abdominal mass measures 12.7 x 4.9 cm. Tumor mass invading anterior abdominal wall at medial RIGHT rectus abdominus muscle and umbilicus, measuring 4.1 x 2.9 x 4.8 cm. Small amount of free fluid in the anterior pelvis. No free air. Scattered intra-abdominal collaterals in the anterior abdomen. Musculoskeletal: No acute osseous lesions. IMPRESSION: Large bladder neoplasm approximately  9.6 x 9.2 x 7.0 cm. Bladder diverticula. Pelvic and retroperitoneal adenopathy. 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 Small amount of ascites. Bibasilar pulmonary nodules question metastases. RIGHT hydronephrosis and hydroureter secondary to distal ureteral obstruction likely by tumor. Findings called to Dr. Corky Downs on 07/11/2019 at 1349 hours. Electronically Signed   By: Lavonia Dana M.D.   On: 07/11/2019 13:51   I personally reviewed the imaging above and note right hydroureteronephrosis.  Assessment & Plan: Patient with right hydronephrosis in the setting of likely metastatic bladder cancer causing distal right ureteral obstruction. Right PCN placement planned for today. Appreciate oncology input in further evaluating and  staging the patient's cancer. I do not believe cystoscopy is necessary at this point given patient's history of known bladder cancer and apparent recurrence per CT as well as likely unresectable status of his tumor. For biopsy, recommend oncology-suggested CT-guided omentum biopsy.   Recommend daily BMP to trend creatinine following right renal decompression. Awaiting urine culture results.  Debroah Loop, PA-C 07/12/2019

## 2019-07-12 NOTE — Progress Notes (Signed)
Pt in for PCN for obstructed Right ureter due to bladder mass.VSS HEENT clear,chest clear,abd benign.Labs ok.Consent obtained.

## 2019-07-12 NOTE — Progress Notes (Signed)
Pt s/p Right pcn without complicatio.VSS.PCN patent to bag drainage.F/U with his MD.Thankyou for consult.

## 2019-07-13 ENCOUNTER — Inpatient Hospital Stay: Payer: Medicare Other

## 2019-07-13 ENCOUNTER — Telehealth: Payer: Self-pay | Admitting: Physician Assistant

## 2019-07-13 DIAGNOSIS — K5903 Drug induced constipation: Secondary | ICD-10-CM

## 2019-07-13 LAB — BASIC METABOLIC PANEL
Anion gap: 10 (ref 5–15)
BUN: 24 mg/dL — ABNORMAL HIGH (ref 8–23)
CO2: 24 mmol/L (ref 22–32)
Calcium: 8.5 mg/dL — ABNORMAL LOW (ref 8.9–10.3)
Chloride: 101 mmol/L (ref 98–111)
Creatinine, Ser: 1.41 mg/dL — ABNORMAL HIGH (ref 0.61–1.24)
GFR calc Af Amer: 59 mL/min — ABNORMAL LOW (ref >60)
GFR calc non Af Amer: 51 mL/min — ABNORMAL LOW (ref >60)
Glucose, Bld: 100 mg/dL — ABNORMAL HIGH (ref 70–99)
Potassium: 4 mmol/L (ref 3.5–5.1)
Sodium: 135 mmol/L (ref 135–145)

## 2019-07-13 LAB — URINE CULTURE: Culture: NO GROWTH

## 2019-07-13 LAB — HIV ANTIBODY (ROUTINE TESTING W REFLEX): HIV Screen 4th Generation wRfx: NONREACTIVE — AB

## 2019-07-13 MED ORDER — FENTANYL CITRATE (PF) 100 MCG/2ML IJ SOLN
INTRAMUSCULAR | Status: AC | PRN
  Administered 2019-07-13: 50 ug via INTRAVENOUS

## 2019-07-13 MED ORDER — OXYCODONE-ACETAMINOPHEN 5-325 MG PO TABS
ORAL_TABLET | ORAL | Status: AC
  Administered 2019-07-13: 12:00:00
  Filled 2019-07-13: qty 1

## 2019-07-13 MED ORDER — MIDAZOLAM HCL 5 MG/5ML IJ SOLN
INTRAMUSCULAR | Status: AC
  Administered 2019-07-13: 11:00:00 1 mg via INTRAVENOUS
  Filled 2019-07-13: qty 5

## 2019-07-13 MED ORDER — FENTANYL CITRATE (PF) 100 MCG/2ML IJ SOLN
INTRAMUSCULAR | Status: AC
  Administered 2019-07-13: 11:00:00 via INTRAVENOUS
  Filled 2019-07-13: qty 4

## 2019-07-13 MED ORDER — OXYCODONE-ACETAMINOPHEN 5-325 MG PO TABS
1.0000 | ORAL_TABLET | ORAL | Status: DC | PRN
  Administered 2019-07-13: 1 via ORAL
  Administered 2019-07-13: 2 via ORAL
  Administered 2019-07-14: 09:00:00 1 via ORAL
  Filled 2019-07-13: qty 1
  Filled 2019-07-13: qty 2
  Filled 2019-07-13: qty 1

## 2019-07-13 MED ORDER — MIDAZOLAM HCL 2 MG/2ML IJ SOLN
INTRAMUSCULAR | Status: AC | PRN
  Administered 2019-07-13 (×2): 1 mg via INTRAVENOUS

## 2019-07-13 MED ORDER — SENNOSIDES-DOCUSATE SODIUM 8.6-50 MG PO TABS
2.0000 | ORAL_TABLET | Freq: Every day | ORAL | Status: DC
  Administered 2019-07-13 – 2019-07-14 (×2): 2 via ORAL
  Filled 2019-07-13 (×2): qty 2

## 2019-07-13 MED ORDER — POLYETHYLENE GLYCOL 3350 17 G PO PACK
17.0000 g | PACK | Freq: Every day | ORAL | Status: DC | PRN
  Administered 2019-07-13: 17 g via ORAL
  Filled 2019-07-13: qty 1

## 2019-07-13 NOTE — Consult Note (Signed)
Chief Complaint: History of bladder cancer, now with concern for metastatic recurrence  Referring Physician(s): Tasia Catchings (Oncology)  Patient Status: [name] - In-pt  History of Present Illness: Johnny Martin is a 68 y.o. male with past medical history significant for noninvasive bladder cancer, post resection who underwent CT scan of the abdomen and pelvis performed 07/11/2019 for evaluation of flank pain and hematuria demonstrating findings worrisome for advanced metastatic disease.  Patient underwent image guided placement of a right-sided percutaneous nephrostomy catheter on 07/11/2019 and returns today for CT-guided biopsy of dominant omental mass for tissue diagnostic purposes.  Patient is currently without complaint.  Specifically, he denies pain associated with recently placed right-sided nephrostomy catheter.  No chest pain, shortness of breath, fever or chills.  Past Medical History:  Diagnosis Date   Cancer [facility])    Bladder cancer    Past Surgical History:  Procedure Laterality Date   BLADDER SURGERY      Allergies: Patient has no known allergies.  Medications: Prior to Admission medications   Medication Sig Start Date End Date Taking? Authorizing Provider  acyclovir (ZOVIRAX) 400 MG tablet Take 1 tablet (400 mg total) by mouth 5 (five) times daily. Patient not taking: Reported on 07/11/2019 01/12/16   Sable Feil, PA-C     Family History  Problem Relation Age of Onset   Cervical cancer Sister     Social History   Socioeconomic History   Marital status: Divorced    Spouse name: Not on file   Number of children: Not on file   Years of education: Not on file   Highest education level: Not on file  Occupational History   Not on file  Social Needs   Financial resource strain: Not on file   Food insecurity    Worry: Not on file    Inability: Not on file   Transportation needs    Medical: Not on file    Non-medical: Not on file  Tobacco Use    Smoking status: Current Every Day Smoker    Packs/day: 0.50   Smokeless tobacco: Never Used  Substance and Sexual Activity   Alcohol use: Never    Frequency: Never   Drug use: Never   Sexual activity: Not on file  Lifestyle   Physical activity    Days per week: Not on file    Minutes per session: Not on file   Stress: Not on file  Relationships   Social connections    Talks on phone: Not on file    Gets together: Not on file    Attends religious service: Not on file    Active member of club or organization: Not on file    Attends meetings of clubs or organizations: Not on file    Relationship status: Not on file  Other Topics Concern   Not on file  Social History Narrative   Lives at home with daughter and son in [place].    ECOG Status: 1 - Symptomatic but completely ambulatory  Review of Systems: A 12 point ROS discussed and pertinent positives are indicated in the HPI above.  All other systems are negative.  Review of Systems  Constitutional: Negative.   Cardiovascular: Negative.   Genitourinary: Positive for flank pain and hematuria.    Vital Signs: BP (!) 149/73 (BP Location: Right Arm)    Pulse 88    Temp 98.3 F (36.8 C) (Oral)    Resp 16    Ht 6\' 1"  (1.854 m)  Wt 72.6 kg    SpO2 94%    BMI 21.11 kg/m   Physical Exam Vitals signs and nursing note reviewed.  Constitutional:      Appearance: Normal appearance. He is normal weight.  Neurological:     Mental Status: He is alert.  Psychiatric:        Mood and Affect: Mood normal.        Behavior: Behavior normal.        Thought Content: Thought content normal.        Judgment: Judgment normal.     Imaging: Ct Abdomen Wo Contrast  Result Date: 07/12/2019 CLINICAL DATA:  Right hydronephrosis. EXAM: CT ABDOMEN WITHOUT CONTRAST TECHNIQUE: Multidetector CT imaging of the abdomen was performed following the standard protocol without IV contrast. COMPARISON:  Prior recent CT. FINDINGS: Limited CT was  obtained in order to determine access for right percutaneous nephrostomy. Right hydronephrosis again identified. Reference is made to percutaneous nephrostomy report. IMPRESSION: Limited CT was obtained in order to determine access for right percutaneous nephrostomy. Reference is made to percutaneous nephrostomy report. Electronically Signed   By: Marcello Moores  Register   On: 07/12/2019 12:20   Ct Renal Stone Study  Result Date: 07/11/2019 CLINICAL DATA:  RIGHT flank pain and hematuria for 1 week, question stone disease; history of bladder tumor post TURBT in 2015 EXAM: CT ABDOMEN AND PELVIS WITHOUT CONTRAST TECHNIQUE: Multidetector CT imaging of the abdomen and pelvis was performed following the standard protocol without IV contrast. Sagittal and coronal MPR images reconstructed from axial data set. No oral contrast administered for this indication. COMPARISON:  02/23/2014 FINDINGS: Lower chest: Subpleural nodule at lingula 6 mm diameter image 1, new. Lung bases emphysematous. Additional nodule versus scar posteromedial LEFT lung base 5 mm diameter image 7. Additional nodularity at posterior sulcus of RIGHT lower lobe up to 8 mm diameter. 12 mm lingular nodule image 3, subpleural. Hepatobiliary: Gallbladder and liver grossly normal appearance Pancreas: Normal appearance Spleen: Normal appearance Adrenals/Urinary Tract: Adrenal glands and LEFT kidney normal appearance. RIGHT hydronephrosis and hydroureter secondary to distal ureteral obstruction question ureteral tumor versus bladder tumor. Several bladder diverticular noted. Tiny dependent calculus within a LEFT lateral bladder diverticulum. Soft tissue mass within bladder consistent with neoplasm, 9.6 x 9.2 x 7.0 cm. Stomach/Bowel: Scattered colonic diverticulosis. Stomach unremarkable. Appendix not visualized. No bowel dilatation or evidence of obstruction, see below. Vascular/Lymphatic: Atherosclerotic calcifications aorta and iliac arteries without aneurysm.  Reproductive: Enlarged LEFT external iliac lymph node 19 mm short axis image 67. Upper normal sized RIGHT cardiophrenic angle node 9 mm act short axis. 10 mm node anterior to aorta image 23. Additional upper normal sized aortocaval, para-aortic, and pelvic nodes. Other: Extensive abnormal tumor deposits throughout the abdomen consistent with omental caking and peritoneal carcinomatosis. Largest of these masses measures 16.3 x 6.6 cm. Additional LEFT abdominal mass measures 12.7 x 4.9 cm. Tumor mass invading anterior abdominal wall at medial RIGHT rectus abdominus muscle and umbilicus, measuring 4.1 x 2.9 x 4.8 cm. Small amount of free fluid in the anterior pelvis. No free air. Scattered intra-abdominal collaterals in the anterior abdomen. Musculoskeletal: No acute osseous lesions. 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 Pelvic and retroperitoneal adenopathy. 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 Small amount of ascites. Bibasilar pulmonary nodules question metastases. RIGHT hydronephrosis and hydroureter secondary to distal ureteral obstruction likely by tumor. Findings called to Dr.  Kinner on 07/11/2019 at 1349 hours. Electronically Signed   By: Lavonia Dana M.D.   On: 07/11/2019 13:51   Ct Image Guided Fluid Drain By Catheter  Result Date: 07/12/2019 INDICATION: Right hydronephrosis secondary to bladder mass. EXAM: Right percutaneous nephrostomy. MEDICATIONS: The patient is currently admitted to the hospital. 1 g of Ancef administered. The antibiotics were administered within an appropriate time frame prior to the initiation of the procedure. ANESTHESIA/SEDATION: Fentanyl 2.0 mcg IV; Versed 25 mg IV Moderate Sedation Time:  Approximately 30 minutes The patient was continuously monitored during the procedure by the interventional radiology nurse  under my direct supervision. COMPLICATIONS: None PROCEDURE: Informed written consent was obtained from the patient after a thorough discussion of the procedural risks, benefits and alternatives. All questions were addressed. Maximal Sterile Barrier Technique was utilized including caps, mask, sterile gowns, sterile gloves, sterile drape, hand hygiene and skin antiseptic. A timeout was performed prior to the initiation of the procedure. Following sterile preparation patient and CT of the patient in prone position, local anesthesia was administered 1% lidocaine. This followed by placement 18 gauge needle into a posterior right renal calyx. This followed by placement of an Amplatz extra stiff wire. This is followed by placement of an 8 French percutaneous drainage catheter. Clear urine obtained. Percutaneous nephrostomy catheter was sutured in place and placed to bag drainage. IMPRESSION: Successful right percutaneous nephrostomy. Electronically Signed   By: Marcello Moores  Register   On: 07/12/2019 12:18    Labs:  CBC: Recent Labs    07/11/19 1130 07/12/19 0417  WBC 12.6* 9.2  HGB 14.9 12.7*  HCT 43.4 37.7*  PLT 245 189    COAGS: Recent Labs    07/12/19 0417  INR 1.0  APTT 29    BMP: Recent Labs    07/11/19 1130 07/12/19 0417 07/13/19 0746  NA 131* 130* 135  K 4.2 4.5 4.0  CL 93* 96* 101  CO2 23 24 24   GLUCOSE 92 94 100*  BUN 23 25* 24*  CALCIUM 9.2 8.4* 8.5*  CREATININE 1.55* 1.49* 1.41*  GFRNONAA 46* 48* 51*  GFRAA 53* 55* 59*    LIVER FUNCTION TESTS: Recent Labs    07/12/19 0417  BILITOT 0.9  AST 23  ALT 13  ALKPHOS 66  PROT 6.1*  ALBUMIN 3.1*    TUMOR MARKERS: No results for input(s): AFPTM, CEA, CA199, CHROMGRNA in the last 8760 hours.  Assessment and Plan:   Johnny Martin is a 68 y.o. male with past medical history significant for noninvasive bladder cancer, post resection who underwent CT scan of the abdomen and pelvis performed 07/11/2019 for evaluation of  flank pain and hematuria demonstrated findings worrisome for advanced metastatic disease.  Patient underwent image guided placement of a right-sided percutaneous nephrostomy catheter on 07/11/2019 and returns today for CT-guided biopsy of dominant omental mass for tissue diagnostic purposes.  Risks and benefits of CT-guided omental mass biopsy was discussed with the patient and/or patient's family including, but not limited to bleeding, infection, damage to adjacent structures or low yield requiring additional tests.  All of the questions were answered and there is agreement to proceed.  Consent signed and in chart.   Thank you for this interesting consult.  I greatly enjoyed meeting TERRIE KOLLIN and look forward to participating in their care.  A copy of this report was sent to the requesting provider on this date.  Electronically Signed: Sandi Mariscal, MD 07/13/2019, 10:05 AM   I spent a total of  20 Minutes in face to face in clinical consultation, greater than 50% of which was counseling/coordinating care for CT-guided omental mass biopsy

## 2019-07-13 NOTE — Progress Notes (Signed)
Hematology/Oncology Progress Note [facility] [phone] [phone]  Patient Care Team: Patient, No Pcp Per as PCP - General (General Practice)   Name of the patient: Johnny Martin  [id number]  October 24, 1950  Date of visit: 07/13/19   INTERVAL HISTORY-  Status post CT-guided omental biopsy today. Feeling well.  Pain is well controlled. Complains constipation. Denies any shortness of breath    Review of systems- Review of Systems  Constitutional: Positive for appetite change, fatigue and unexpected weight change.  HENT:         Firm Neck mass   Respiratory: Negative for cough and shortness of breath.   Cardiovascular: Negative for chest pain and leg swelling.  Gastrointestinal: Positive for abdominal pain and constipation.  Genitourinary: Positive for hematuria.   Musculoskeletal: Negative for arthralgias.  Skin: Negative for itching.  Hematological: Negative for adenopathy.  Psychiatric/Behavioral: Negative for confusion. The patient is not nervous/anxious.     No Known Allergies  Patient Active Problem List   Diagnosis Date Noted   Bladder mass 07/11/2019   Hydronephrosis due to obstruction of ureter 07/11/2019   Gross hematuria    Peritoneal carcinomatosis (HCC)    Mass of left side of neck    AKI (acute kidney injury) ([place])      Past Medical History:  Diagnosis Date   Cancer [facility])    Bladder cancer     Past Surgical History:  Procedure Laterality Date   BLADDER SURGERY      Social History   Socioeconomic History   Marital status: Divorced    Spouse name: Not on file   Number of children: Not on file   Years of education: Not on file   Highest education level: Not on file  Occupational History   Not on file  Social Needs   Financial resource strain: Not on file   Food insecurity    Worry: Not on file    Inability: Not on file   Transportation needs    Medical: Not on file   Non-medical: Not on file  Tobacco Use   Smoking status: Current Every Day Smoker    Packs/day: 0.50   Smokeless tobacco: Never Used  Substance and Sexual Activity   Alcohol use: Never    Frequency: Never   Drug use: Never   Sexual activity: Not on file  Lifestyle   Physical activity    Days per week: Not on file    Minutes per session: Not on file   Stress: Not on file  Relationships   Social connections    Talks on phone: Not on file    Gets together: Not on file    Attends religious service: Not on file    Active member of club or organization: Not on file    Attends meetings of clubs or organizations: Not on file    Relationship status: Not on file   Intimate partner violence    Fear of current or ex partner: Not on file    Emotionally abused: Not on file    Physically abused: Not on file    Forced sexual activity: Not on file  Other Topics Concern   Not on file  Social History Narrative   Lives at home with daughter and son in [employer].     Family History  Problem Relation Age of Onset   Cervical cancer Sister      Current Facility-Administered Medications:    docusate sodium (COLACE) capsule 100 mg, 100 mg,  Oral, BID PRN, Vaughan Basta, MD   influenza vaccine adjuvanted (FLUAD) injection 0.5 mL, 0.5 mL, Intramuscular, Tomorrow-1000, Vaughan Basta, MD   metoprolol tartrate (LOPRESSOR) tablet 25 mg, 25 mg, Oral, BID, Vaughan Basta, MD, 25 mg at 07/13/19 1226   nicotine (NICODERM CQ - dosed in mg/24 hours) patch 21 mg, 21 mg, Transdermal, Daily, Wieting, Richard, MD, 21 mg at 07/12/19 1951   oxyCODONE-acetaminophen (PERCOCET/ROXICET) 5-325 MG per tablet 1-2 tablet, 1-2 tablet, Oral, Q4H PRN, Loletha Grayer, MD, 2 tablet at 07/13/19 1621   pantoprazole (PROTONIX) EC tablet 40 mg, 40 mg, Oral, Daily, Vaughan Basta, MD, 40 mg at 07/13/19 1226   senna-docusate (Senokot-S) tablet 1 tablet, 1 tablet, Oral, BID, Vaughan Basta, MD, 1 tablet at 07/13/19 1226   sodium chloride flush (NS) 0.9 % injection 5 mL, 5 mL, Intracatheter, Q8H, Register, Anola Gurney., MD, 5 mL at 07/13/19 1227   Physical exam:  Vitals:   07/13/19 1626 07/13/19 1727 07/13/19 1748 07/13/19 1911  BP: (!) 151/82 (!) 151/78  (!) 147/83  Pulse: 84 87  79  Resp: 18 18  16   Temp: 97.8 F (36.6 C)  97.9 F (36.6 C) 97.6 F (36.4 C)  TempSrc: Oral  Oral Oral  SpO2: 98% 93%  94%  Weight:      Height:       Physical Exam  Constitutional: He is oriented to person, place, and time. No distress.  HENT:  Head: Normocephalic and atraumatic.  Nose: Nose normal.  Mouth/Throat: Oropharynx is clear and moist. No oropharyngeal exudate.  Eyes: Pupils are equal, round, and reactive to light. EOM are normal. No scleral icterus.  Neck: Normal range of motion. Neck supple.  Palpable left neck mass, firm.   Cardiovascular: Normal rate and regular rhythm.  No murmur heard. Pulmonary/Chest: Effort normal. No respiratory distress. He has no rales. He exhibits no tenderness.  Decreased breath sounds.   Abdominal: Soft. He exhibits no distension. There is no abdominal tenderness.  s/p percutaneous nephrostomy on the right side. Nephrostomy bag with bloody urine  Musculoskeletal: Normal range of motion.        General: No edema.  Neurological: He is alert and oriented to person, place, and time.  Skin: Skin is warm and dry. He is not diaphoretic. No erythema.  Psychiatric: Affect normal.       CMP Latest Ref Rng & Units 07/13/2019  Glucose 70 - 99 mg/dL 100(H)  BUN 8 - 23 mg/dL 24(H)  Creatinine 0.61 - 1.24 mg/dL 1.41(H)  Sodium 135 - 145 mmol/L 135  Potassium 3.5 - 5.1 mmol/L 4.0  Chloride 98 - 111 mmol/L 101  CO2 22 - 32 mmol/L 24  Calcium 8.9 - 10.3 mg/dL 8.5(L)  Total Protein 6.5 - 8.1 g/dL -  Total Bilirubin 0.3 - 1.2 mg/dL -  Alkaline Phos 38 - 126 U/L -  AST 15 - 41 U/L -  ALT 0 - 44 U/L -   CBC Latest Ref Rng & Units  07/12/2019  WBC 4.0 - 10.5 K/uL 9.2  Hemoglobin 13.0 - 17.0 g/dL 12.7(L)  Hematocrit 39.0 - 52.0 % 37.7(L)  Platelets 150 - 400 K/uL 189    RADIOGRAPHIC STUDIES: I have personally reviewed the radiological images as listed and agreed with the findings in the report. Ct Abdomen Wo Contrast  Result Date: 07/12/2019 CLINICAL DATA:  Right hydronephrosis. EXAM: CT ABDOMEN WITHOUT CONTRAST TECHNIQUE: Multidetector CT imaging of the abdomen was performed following the standard protocol without IV contrast. COMPARISON:  Prior recent CT. FINDINGS: Limited CT was obtained in order to determine access for right percutaneous nephrostomy. Right hydronephrosis again identified. Reference is made to percutaneous nephrostomy report. IMPRESSION: Limited CT was obtained in order to determine access for right percutaneous nephrostomy. Reference is made to percutaneous nephrostomy report. Electronically Signed   By: Marcello Moores  Register   On: 07/12/2019 12:20   Ct Guided Needle Placement  Result Date: 07/13/2019 INDICATION: History of bladder cancer, now with peritoneal masses worrisome for metastatic disease. Please perform CT-guided biopsy for tissue diagnostic purposes. EXAM: CT GUIDANCE NEEDLE PLACEMENT COMPARISON:  CT abdomen and pelvis - 07/11/2019 MEDICATIONS: None. ANESTHESIA/SEDATION: Fentanyl 100 mcg IV; Versed 2 mg IV Sedation time: 10 minutes; The patient was continuously monitored during the procedure by the interventional radiology nurse under my direct supervision. CONTRAST:  None. COMPLICATIONS: None immediate. PROCEDURE: Informed consent was obtained from the patient following an explanation of the procedure, risks, benefits and alternatives. A time out was performed prior to the initiation of the procedure. The patient was positioned supine on the CT table and a limited CT was performed for procedural planning demonstrating unchanged size and appearance of the infiltrative mass involving the left pericolic  gutter with dominant component measuring at least 12.3 x 6.2 cm (image 26, series 2). The procedure was planned. The operative site was prepped and draped in the usual sterile fashion. Appropriate trajectory was confirmed with a 22 gauge spinal needle after the adjacent tissues were anesthetized with 1% Lidocaine with epinephrine. Under intermittent CT guidance, a 17 gauge coaxial needle was advanced into the peripheral aspect of the mass. Appropriate positioning was confirmed and 5 core needle biopsy samples were obtained with an 18 gauge core needle biopsy device. The co-axial needle was removed following the administration of a Gel-Foam slurry and superficial hemostasis was achieved with manual compression. A limited postprocedural CT was negative for hemorrhage or additional complication. A dressing was placed. The patient tolerated the procedure well without immediate postprocedural complication. IMPRESSION: Technically successful CT guided core needle biopsy of infiltrative mass within the left pericolic gutter. Electronically Signed   By: Sandi Mariscal M.D.   On: 07/13/2019 11:35   Ct Renal Stone Study  Result Date: 07/11/2019 CLINICAL DATA:  RIGHT flank pain and hematuria for 1 week, question stone disease; history of bladder tumor post TURBT in 2015 EXAM: CT ABDOMEN AND PELVIS WITHOUT CONTRAST TECHNIQUE: Multidetector CT imaging of the abdomen and pelvis was performed following the standard protocol without IV contrast. Sagittal and coronal MPR images reconstructed from axial data set. No oral contrast administered for this indication. COMPARISON:  02/23/2014 FINDINGS: Lower chest: Subpleural nodule at lingula 6 mm diameter image 1, new. Lung bases emphysematous. Additional nodule versus scar posteromedial LEFT lung base 5 mm diameter image 7. Additional nodularity at posterior sulcus of RIGHT lower lobe up to 8 mm diameter. 12 mm lingular nodule image 3, subpleural. Hepatobiliary: Gallbladder and liver  grossly normal appearance Pancreas: Normal appearance Spleen: Normal appearance Adrenals/Urinary Tract: Adrenal glands and LEFT kidney normal appearance. RIGHT hydronephrosis and hydroureter secondary to distal ureteral obstruction question ureteral tumor versus bladder tumor. Several bladder diverticular noted. Tiny dependent calculus within a LEFT lateral bladder diverticulum. Soft tissue mass within bladder consistent with neoplasm, 9.6 x 9.2 x 7.0 cm. Stomach/Bowel: Scattered colonic diverticulosis. Stomach unremarkable. Appendix not visualized. No bowel dilatation or evidence of obstruction, see below. Vascular/Lymphatic: Atherosclerotic calcifications aorta and iliac arteries without aneurysm. Reproductive: Enlarged LEFT external iliac lymph node 19 mm short  axis image 67. Upper normal sized RIGHT cardiophrenic angle node 9 mm act short axis. 10 mm node anterior to aorta image 23. Additional upper normal sized aortocaval, para-aortic, and pelvic nodes. Other: Extensive abnormal tumor deposits throughout the abdomen consistent with omental caking and peritoneal carcinomatosis. Largest of these masses measures 16.3 x 6.6 cm. Additional LEFT abdominal mass measures 12.7 x 4.9 cm. Tumor mass invading anterior abdominal wall at medial RIGHT rectus abdominus muscle and umbilicus, measuring 4.1 x 2.9 x 4.8 cm. Small amount of free fluid in the anterior pelvis. No free air. Scattered intra-abdominal collaterals in the anterior abdomen. Musculoskeletal: No acute osseous lesions. 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 Pelvic and retroperitoneal adenopathy. 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 Small amount of ascites. Bibasilar pulmonary nodules question metastases. RIGHT hydronephrosis and hydroureter secondary to distal ureteral obstruction likely by  tumor. Findings called to Dr. Corky Downs on 07/11/2019 at 1349 hours. Electronically Signed   By: Lavonia Dana M.D.   On: 07/11/2019 13:51   Ct Image Guided Fluid Drain By Catheter  Result Date: 07/12/2019 INDICATION: Right hydronephrosis secondary to bladder mass. EXAM: Right percutaneous nephrostomy. MEDICATIONS: The patient is currently admitted to the hospital. 1 g of Ancef administered. The antibiotics were administered within an appropriate time frame prior to the initiation of the procedure. ANESTHESIA/SEDATION: Fentanyl 2.0 mcg IV; Versed 25 mg IV Moderate Sedation Time:  Approximately 30 minutes The patient was continuously monitored during the procedure by the interventional radiology nurse under my direct supervision. COMPLICATIONS: None PROCEDURE: Informed written consent was obtained from the patient after a thorough discussion of the procedural risks, benefits and alternatives. All questions were addressed. Maximal Sterile Barrier Technique was utilized including caps, mask, sterile gowns, sterile gloves, sterile drape, hand hygiene and skin antiseptic. A timeout was performed prior to the initiation of the procedure. Following sterile preparation patient and CT of the patient in prone position, local anesthesia was administered 1% lidocaine. This followed by placement 18 gauge needle into a posterior right renal calyx. This followed by placement of an Amplatz extra stiff wire. This is followed by placement of an 8 French percutaneous drainage catheter. Clear urine obtained. Percutaneous nephrostomy catheter was sutured in place and placed to bag drainage. IMPRESSION: Successful right percutaneous nephrostomy. Electronically Signed   By: Marcello Moores  Register   On: 07/12/2019 12:18    Assessment and plan-  Patient is a 68 y.o. male with a history of noninvasive bladder cancer status post resection present for evaluation of flank pain and hematuria.  CT findings concerning for widely metastatic bladder  cancer.  #Bladder mass, with extensive peritoneal carcinomatosis, pelvic and retroperitoneal adenopathy, right hydronephrosis Likely metastatic bladder cancer. Status post CT-guided biopsy of the omentum. Discussed with patient that systemic chemotherapy will be recommended if metastatic bladder cancer is confirmed.  #AKI /post renal obstruction due to tumor invasion of ureter, Status post percutaneous nephrostomy tube placement. Continue Percocet / 325 mg 1 to 2 tablets every 4 hours as needed.  #Left neck mass, not typical for metastatic disease.  Concerning for a second primary, i.e., head and neck cancer. Plan to obtain outpatient PET scan for staging.  Plan outpatient ultrasound-guided biopsy the left neck mass.  # Hematuria, hemoglobin trending down slightly.  Continue to monitor. #Hyponatremia, sodium is 130.  Questionable SIADH. #Constipation, likely secondary to narcotic use.  Recommend continue Senokot 2  tablets daily, MiraLAX daily PRN From oncology aspect, patient may go home tomorrow with outpatient follow-up.  Please provide him a few days supply of pain medication and I will continue titrating according to his pain level.   Earlie Server, MD, PhD Hematology Oncology [facility] at [facility] Pager- [phone] 07/13/2019

## 2019-07-13 NOTE — Progress Notes (Signed)
Urology Inpatient Progress Note  Subjective: Johnny Martin is a 68 y.o. male admitted on 07/11/2019 with right hydroureteronephrosis secondary to distal ureteral compression from presumed metastatic bladder cancer.  Right PCN placed yesterday; omental biopsy planned for today.  Creatinine 1.41 today (1.49 yesterday). Patient is afebrile and hypertensive to the 150s/90s. Urine culture negative.  Right PCN in place draining clear, pink-tinged urine.  Patient reports improved right flank pain since PCN placement.  He does note some constipation and states he has not had a bowel movement since admission.  Anti-infectives: Anti-infectives (From admission, onward)   Start     Dose/Rate Route Frequency Ordered Stop   07/12/19 1145  ceFAZolin (ANCEF) IVPB 1 g/50 mL premix     1 g 100 mL/hr over 30 Minutes Intravenous  Once 07/12/19 1136 07/12/19 1207      Current Facility-Administered Medications  Medication Dose Route Frequency Provider Last Rate Last Dose  . 0.9 %  sodium chloride infusion   Intravenous Continuous Loletha Grayer, MD 30 mL/hr at 07/13/19 0600    . docusate sodium (COLACE) capsule 100 mg  100 mg Oral BID PRN Vaughan Basta, MD      . fentaNYL (SUBLIMAZE) 100 MCG/2ML injection           . influenza vaccine adjuvanted (FLUAD) injection 0.5 mL  0.5 mL Intramuscular Tomorrow-1000 Vaughan Basta, MD      . metoprolol tartrate (LOPRESSOR) tablet 25 mg  25 mg Oral BID Vaughan Basta, MD   25 mg at 07/12/19 2118  . midazolam (VERSED) 5 MG/5ML injection           . nicotine (NICODERM CQ - dosed in mg/24 hours) patch 21 mg  21 mg Transdermal Daily Loletha Grayer, MD   21 mg at 07/12/19 1951  . oxyCODONE-acetaminophen (PERCOCET/ROXICET) 5-325 MG per tablet 1 tablet  1 tablet Oral Q6H PRN Vaughan Basta, MD   1 tablet at 07/13/19 0303  . pantoprazole (PROTONIX) EC tablet 40 mg  40 mg Oral Daily Vaughan Basta, MD   40 mg at 07/12/19 0939  .  senna-docusate (Senokot-S) tablet 1 tablet  1 tablet Oral BID Vaughan Basta, MD   1 tablet at 07/12/19 2118  . sodium chloride flush (NS) 0.9 % injection 5 mL  5 mL Intracatheter Q8H Register, Anola Gurney., MD   5 mL at 07/13/19 0634   Objective: Vital signs in last 24 hours: Temp:  [97.7 F (36.5 C)-98.3 F (36.8 C)] 98.3 F (36.8 C) (09/30 0915) Pulse Rate:  [77-96] 89 (09/30 1036) Resp:  [16-25] 20 (09/30 1036) BP: (108-151)/(54-91) 151/91 (09/30 1036) SpO2:  [92 %-99 %] 98 % (09/30 1036)  Intake/Output from previous day: 09/29 0701 - 09/30 0700 In: 1427.9 [P.O.:360; I.V.:1067.9] Out: 2625 [Urine:2625] Intake/Output this shift: No intake/output data recorded.  Physical Exam Vitals signs and nursing note reviewed.  Constitutional:      General: He is not in acute distress.    Appearance: He is underweight. He is not ill-appearing, toxic-appearing or diaphoretic.  Pulmonary:     Effort: No respiratory distress.  Abdominal:     General: Abdomen is flat. There is no distension.     Palpations: There is mass.     Tenderness: There is no abdominal tenderness. There is no right CVA tenderness, left CVA tenderness, guarding or rebound.  Skin:    General: Skin is warm and dry.  Neurological:     Mental Status: He is alert and oriented to person, place, and time.  Psychiatric:        Mood and Affect: Mood normal.        Behavior: Behavior normal.    Lab Results:  Recent Labs    07/11/19 1130 07/12/19 0417  WBC 12.6* 9.2  HGB 14.9 12.7*  HCT 43.4 37.7*  PLT 245 189   BMET Recent Labs    07/12/19 0417 07/13/19 0746  NA 130* 135  K 4.5 4.0  CL 96* 101  CO2 24 24  GLUCOSE 94 100*  BUN 25* 24*  CREATININE 1.49* 1.41*  CALCIUM 8.4* 8.5*   PT/INR Recent Labs    07/12/19 0417  LABPROT 12.6  INR 1.0   Assessment & Plan: Patient clinically stable with improvement in right flank pain following right PCN placement for management of acute  hydroureteronephrosis secondary to distal ureteral compression from presumed metastatic bladder cancer.  Omental biopsy scheduled for today.  Based on results, will defer to oncology for further management.  No indication of left ureteral obstruction at this time.  Recommend outpatient renal ultrasound to evaluate for resolution of right hydronephrosis.  Okay to discharge from urologic perspective.  Debroah Loop, PA-C 07/13/2019

## 2019-07-13 NOTE — Progress Notes (Addendum)
Patient ID: Johnny Martin, male   DOB: 01/16/1951, 68 y.o.   MRN: [id number]  [employer] PROGRESS NOTE  ROCKWELL ZELDIN [id number] DOB: 01-04-1951 DOA: 07/11/2019 PCP: Patient, No Pcp Per  HPI/Subjective: Patient seen after interventional radiology biopsy today.  Patient feeling okay but wanted to be watched a little bit further in the hospital.  Objective: Vitals:   07/13/19 1115 07/13/19 1130  BP: 124/71 (!) 155/77  Pulse: 80 83  Resp: 20 (!) 23  Temp:    SpO2: 91% 94%    Intake/Output Summary (Last 24 hours) at 07/13/2019 1315 Last data filed at 07/13/2019 0600 Gross per 24 hour  Intake 948.89 ml  Output 2000 ml  Net -1051.11 ml   Filed Weights   07/11/19 1120  Weight: 72.6 kg    ROS: Review of Systems  Constitutional: Negative for chills and fever.  Eyes: Negative for blurred vision.  Respiratory: Negative for cough and shortness of breath.   Cardiovascular: Negative for chest pain.  Gastrointestinal: Negative for abdominal pain, constipation, diarrhea, nausea and vomiting.  Genitourinary: Negative for dysuria.  Musculoskeletal: Positive for back pain. Negative for joint pain.  Neurological: Negative for dizziness and headaches.   Exam: Physical Exam  Constitutional: He is oriented to person, place, and time.  HENT:  Nose: No mucosal edema.  Mouth/Throat: No oropharyngeal exudate or posterior oropharyngeal edema.  Eyes: Pupils are equal, round, and reactive to light. Conjunctivae, EOM and lids are normal.  Neck: No JVD present. Carotid bruit is not present. No edema present. No thyroid mass and no thyromegaly present.  Cardiovascular: S1 normal and S2 normal. Exam reveals no gallop.  No murmur heard. Pulses:      Dorsalis pedis pulses are 2+ on the right side and 2+ on the left side.  Respiratory: No respiratory distress. He has no wheezes. He has no rhonchi. He has no rales.  GI: Soft. Bowel sounds are normal. There is no abdominal tenderness.   Musculoskeletal:     Right ankle: He exhibits no swelling.     Left ankle: He exhibits no swelling.  Lymphadenopathy:    He has no cervical adenopathy.  Neurological: He is alert and oriented to person, place, and time. No cranial nerve deficit.  Skin: Skin is warm. No rash noted. Nails show no clubbing.  Psychiatric: He has a normal mood and affect.      Data Reviewed: Basic Metabolic Panel: Recent Labs  Lab 07/11/19 1130 07/12/19 0417 07/13/19 0746  NA 131* 130* 135  K 4.2 4.5 4.0  CL 93* 96* 101  CO2 23 24 24   GLUCOSE 92 94 100*  BUN 23 25* 24*  CREATININE 1.55* 1.49* 1.41*  CALCIUM 9.2 8.4* 8.5*   Liver Function Tests: Recent Labs  Lab 07/12/19 0417  AST 23  ALT 13  ALKPHOS 66  BILITOT 0.9  PROT 6.1*  ALBUMIN 3.1*   CBC: Recent Labs  Lab 07/11/19 1130 07/12/19 0417  WBC 12.6* 9.2  HGB 14.9 12.7*  HCT 43.4 37.7*  MCV 87.9 87.9  PLT 245 189     Recent Results (from the past 240 hour(s))  SARS Coronavirus 2 [facility] order, Performed in [facility] hospital lab) Nasopharyngeal Nasopharyngeal Swab     Status: None   Collection Time: 07/11/19  2:43 PM   Specimen: Nasopharyngeal Swab  Result Value Ref Range Status   SARS Coronavirus 2 NEGATIVE NEGATIVE Final    Comment: (NOTE) If result is NEGATIVE SARS-CoV-2 target nucleic acids are  NOT DETECTED. The SARS-CoV-2 RNA is generally detectable in upper and lower  respiratory specimens during the acute phase of infection. The lowest  concentration of SARS-CoV-2 viral copies this assay can detect is 250  copies / mL. A negative result does not preclude SARS-CoV-2 infection  and should not be used as the sole basis for treatment or other  patient management decisions.  A negative result may occur with  improper specimen collection / handling, submission of specimen other  than nasopharyngeal swab, presence of viral mutation(s) within the  areas targeted by this assay, and inadequate number of viral copies   (<250 copies / mL). A negative result must be combined with clinical  observations, patient history, and epidemiological information. If result is POSITIVE SARS-CoV-2 target nucleic acids are DETECTED. The SARS-CoV-2 RNA is generally detectable in upper and lower  respiratory specimens dur ing the acute phase of infection.  Positive  results are indicative of active infection with SARS-CoV-2.  Clinical  correlation with patient history and other diagnostic information is  necessary to determine patient infection status.  Positive results do  not rule out bacterial infection or co-infection with other viruses. If result is PRESUMPTIVE POSTIVE SARS-CoV-2 nucleic acids MAY BE PRESENT.   A presumptive positive result was obtained on the submitted specimen  and confirmed on repeat testing.  While 2019 novel coronavirus  (SARS-CoV-2) nucleic acids may be present in the submitted sample  additional confirmatory testing may be necessary for epidemiological  and / or clinical management purposes  to differentiate between  SARS-CoV-2 and other Sarbecovirus currently known to infect humans.  If clinically indicated additional testing with an alternate test  methodology [phone]) is advised. The SARS-CoV-2 RNA is generally  detectable in upper and lower respiratory sp ecimens during the acute  phase of infection. The expected result is Negative. Fact Sheet for Patients:  StrictlyIdeas.no Fact Sheet for Healthcare Providers: BankingDealers.co.za This test is not yet approved or cleared by the [place] FDA and has been authorized for detection and/or diagnosis of SARS-CoV-2 by FDA under an Emergency Use Authorization (EUA).  This EUA will remain in effect (meaning this test can be used) for the duration of the COVID-19 declaration under Section 564(b)(1) of the Act, 21 U.S.C. section 360bbb-3(b)(1), unless the authorization is terminated  or revoked sooner. Performed at [facility], [address] [place], [place] [address]   Urine culture     Status: None   Collection Time: 07/12/19 11:57 AM   Specimen: Urine, Catheterized  Result Value Ref Range Status   Specimen Description   Final    URINE, CATHETERIZED Performed at [facility], [place] [address] [place], [place] [address]    Special Requests   Final    NONE Performed at [facility], [address] [place], [place] [address]    Culture   Final    NO GROWTH Performed at [facility], [place] [address], [place], [place] [address]    Report Status 07/13/2019 FINAL  Final     Studies: Ct Abdomen Wo Contrast  Result Date: 07/12/2019 CLINICAL DATA:  Right hydronephrosis. EXAM: CT ABDOMEN WITHOUT CONTRAST TECHNIQUE: Multidetector CT imaging of the abdomen was performed following the standard protocol without IV contrast. COMPARISON:  Prior recent CT. FINDINGS: Limited CT was obtained in order to determine access for right percutaneous nephrostomy. Right hydronephrosis again identified. Reference is made to percutaneous nephrostomy report. IMPRESSION: Limited CT was obtained in order to determine access  for right percutaneous nephrostomy. Reference is made to percutaneous nephrostomy report. Electronically Signed   By: Marcello Moores  Register   On: 07/12/2019 12:20   Ct Guided Needle Placement  Result Date: 07/13/2019 INDICATION: History of bladder cancer, now with peritoneal masses worrisome for metastatic disease. Please perform CT-guided biopsy for tissue diagnostic purposes. EXAM: CT GUIDANCE NEEDLE PLACEMENT COMPARISON:  CT abdomen and pelvis - 07/11/2019 MEDICATIONS: None. ANESTHESIA/SEDATION: Fentanyl 100 mcg IV; Versed 2 mg IV Sedation time: 10 minutes; The patient was continuously monitored during the procedure by the interventional radiology nurse under my direct supervision. CONTRAST:  None. COMPLICATIONS: None immediate. PROCEDURE:  Informed consent was obtained from the patient following an explanation of the procedure, risks, benefits and alternatives. A time out was performed prior to the initiation of the procedure. The patient was positioned supine on the CT table and a limited CT was performed for procedural planning demonstrating unchanged size and appearance of the infiltrative mass involving the left pericolic gutter with dominant component measuring at least 12.3 x 6.2 cm (image 26, series 2). The procedure was planned. The operative site was prepped and draped in the usual sterile fashion. Appropriate trajectory was confirmed with a 22 gauge spinal needle after the adjacent tissues were anesthetized with 1% Lidocaine with epinephrine. Under intermittent CT guidance, a 17 gauge coaxial needle was advanced into the peripheral aspect of the mass. Appropriate positioning was confirmed and 5 core needle biopsy samples were obtained with an 18 gauge core needle biopsy device. The co-axial needle was removed following the administration of a Gel-Foam slurry and superficial hemostasis was achieved with manual compression. A limited postprocedural CT was negative for hemorrhage or additional complication. A dressing was placed. The patient tolerated the procedure well without immediate postprocedural complication. IMPRESSION: Technically successful CT guided core needle biopsy of infiltrative mass within the left pericolic gutter. Electronically Signed   By: Sandi Mariscal M.D.   On: 07/13/2019 11:35   Ct Renal Stone Study  Result Date: 07/11/2019 CLINICAL DATA:  RIGHT flank pain and hematuria for 1 week, question stone disease; history of bladder tumor post TURBT in 2015 EXAM: CT ABDOMEN AND PELVIS WITHOUT CONTRAST TECHNIQUE: Multidetector CT imaging of the abdomen and pelvis was performed following the standard protocol without IV contrast. Sagittal and coronal MPR images reconstructed from axial data set. No oral contrast administered for  this indication. COMPARISON:  02/23/2014 FINDINGS: Lower chest: Subpleural nodule at lingula 6 mm diameter image 1, new. Lung bases emphysematous. Additional nodule versus scar posteromedial LEFT lung base 5 mm diameter image 7. Additional nodularity at posterior sulcus of RIGHT lower lobe up to 8 mm diameter. 12 mm lingular nodule image 3, subpleural. Hepatobiliary: Gallbladder and liver grossly normal appearance Pancreas: Normal appearance Spleen: Normal appearance Adrenals/Urinary Tract: Adrenal glands and LEFT kidney normal appearance. RIGHT hydronephrosis and hydroureter secondary to distal ureteral obstruction question ureteral tumor versus bladder tumor. Several bladder diverticular noted. Tiny dependent calculus within a LEFT lateral bladder diverticulum. Soft tissue mass within bladder consistent with neoplasm, 9.6 x 9.2 x 7.0 cm. Stomach/Bowel: Scattered colonic diverticulosis. Stomach unremarkable. Appendix not visualized. No bowel dilatation or evidence of obstruction, see below. Vascular/Lymphatic: Atherosclerotic calcifications aorta and iliac arteries without aneurysm. Reproductive: Enlarged LEFT external iliac lymph node 19 mm short axis image 67. Upper normal sized RIGHT cardiophrenic angle node 9 mm act short axis. 10 mm node anterior to aorta image 23. Additional upper normal sized aortocaval, para-aortic, and pelvic nodes. Other: Extensive abnormal tumor deposits throughout  the abdomen consistent with omental caking and peritoneal carcinomatosis. Largest of these masses measures 16.3 x 6.6 cm. Additional LEFT abdominal mass measures 12.7 x 4.9 cm. Tumor mass invading anterior abdominal wall at medial RIGHT rectus abdominus muscle and umbilicus, measuring 4.1 x 2.9 x 4.8 cm. Small amount of free fluid in the anterior pelvis. No free air. Scattered intra-abdominal collaterals in the anterior abdomen. Musculoskeletal: No acute osseous lesions. IMPRESSION: Large bladder neoplasm approximately 9.6 x  9.2 x 7.0 cm. Bladder diverticula. Pelvic and retroperitoneal adenopathy. 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 Small amount of ascites. Bibasilar pulmonary nodules question metastases. RIGHT hydronephrosis and hydroureter secondary to distal ureteral obstruction likely by tumor. Findings called to Dr. Corky Downs on 07/11/2019 at 1349 hours. Electronically Signed   By: Lavonia Dana M.D.   On: 07/11/2019 13:51   Ct Image Guided Fluid Drain By Catheter  Result Date: 07/12/2019 INDICATION: Right hydronephrosis secondary to bladder mass. EXAM: Right percutaneous nephrostomy. MEDICATIONS: The patient is currently admitted to the hospital. 1 g of Ancef administered. The antibiotics were administered within an appropriate time frame prior to the initiation of the procedure. ANESTHESIA/SEDATION: Fentanyl 2.0 mcg IV; Versed 25 mg IV Moderate Sedation Time:  Approximately 30 minutes The patient was continuously monitored during the procedure by the interventional radiology nurse under my direct supervision. COMPLICATIONS: None PROCEDURE: Informed written consent was obtained from the patient after a thorough discussion of the procedural risks, benefits and alternatives. All questions were addressed. Maximal Sterile Barrier Technique was utilized including caps, mask, sterile gowns, sterile gloves, sterile drape, hand hygiene and skin antiseptic. A timeout was performed prior to the initiation of the procedure. Following sterile preparation patient and CT of the patient in prone position, local anesthesia was administered 1% lidocaine. This followed by placement 18 gauge needle into a posterior right renal calyx. This followed by placement of an Amplatz extra stiff wire. This is followed by placement of an 8 French percutaneous drainage catheter. Clear urine obtained. Percutaneous nephrostomy catheter was sutured  in place and placed to bag drainage. IMPRESSION: Successful right percutaneous nephrostomy. Electronically Signed   By: Marcello Moores  Register   On: 07/12/2019 12:18    Scheduled Meds: . influenza vaccine adjuvanted  0.5 mL Intramuscular Tomorrow-1000  . metoprolol tartrate  25 mg Oral BID  . nicotine  21 mg Transdermal Daily  . pantoprazole  40 mg Oral Daily  . senna-docusate  1 tablet Oral BID  . sodium chloride flush  5 mL Intracatheter Q8H   Continuous Infusions: . sodium chloride 30 mL/hr at 07/13/19 0600    Assessment/Plan:  1. 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  Appreciate oncology and urology consultations.  Patient had a nephrostomy tube placed yesterday.  Interventional radiology biopsy of mass today.  Likely discharge home tomorrow morning.  Explained to patient and daughter at the bedside that any sort of treatment would be palliative in nature trying to slow things down. 2. Hydronephrosis and hydroureter on the right.  Nephrostomy tube placed yesterday by interventional radiology. 3. Acute kidney injury on chronic kidney disease stage III gentle IV fluid hydration. 4. Hyponatremia improved with IV fluids. 5. Hematuria secondary to bladder mass. 6. Tobacco abuse on nicotine patch 7. Left cervical mass.  Patient will need follow-up with Dr. Tasia Catchings oncology as outpatient for the metastatic carcinomatosis in his left cervical mass.  Patient will likely need a PET CT scan as outpatient.  Code Status:     Code Status Orders  (From admission, onward)         Start     Ordered   07/11/19 1700  Do not attempt resuscitation (DNR)  Continuous    Question Answer Comment  In the event of cardiac or respiratory ARREST Do not call a "code blue"   In the event of cardiac or respiratory ARREST Do not perform Intubation, CPR, defibrillation or ACLS   In the event of cardiac or respiratory ARREST Use medication by any route, position, wound care, and other measures to relive  pain and suffering. May use oxygen, suction and manual treatment of airway obstruction as needed for comfort.      07/11/19 1659        Code Status History    This patient has a current code status but no historical code status.   Advance Care Planning Activity     Family Communication: Spoke with patient's daughter at the bedside Disposition Plan: We will discharge home tomorrow morning  Consultants:  Oncology  Urology  Interventional radiology  Procedures:  Right nephrostomy tube  Biopsy of omental mass  Time spent: 27 minutes  [place]

## 2019-07-13 NOTE — OR Nursing (Signed)
successful time out on arrival to CT, moderate sedation of 2 mg versed and 100 mg fentanyl. While charting in procedural log record disappeared. However record of vital signs on flowsheet. Pt reported intial lower abd pain of 6/10 but reduced to no pain by end of case. Was on 2 liters [place] during procedure but this was discontinued prior to transfer to specials recovery. Report given to Gwenlyn Found RN

## 2019-07-13 NOTE — Telephone Encounter (Signed)
Error

## 2019-07-13 NOTE — Procedures (Signed)
Pre procedural Dx: Omental mass  Post procedural Dx: Same  Technically successful CT guided biopsy of dominant omental mass within the left mid hemiabdomen.   EBL: None.  Complications: None immediate.   Ronny Bacon, MD Pager #: [phone]

## 2019-07-14 ENCOUNTER — Telehealth: Payer: Self-pay | Admitting: *Deleted

## 2019-07-14 LAB — SURGICAL PATHOLOGY

## 2019-07-14 LAB — BASIC METABOLIC PANEL
Anion gap: 10 (ref 5–15)
BUN: 24 mg/dL — ABNORMAL HIGH (ref 8–23)
CO2: 27 mmol/L (ref 22–32)
Calcium: 8.4 mg/dL — ABNORMAL LOW (ref 8.9–10.3)
Chloride: 100 mmol/L (ref 98–111)
Creatinine, Ser: 1.24 mg/dL (ref 0.61–1.24)
GFR calc Af Amer: >60 mL/min (ref >60)
GFR calc non Af Amer: 60 mL/min — ABNORMAL LOW (ref >60)
Glucose, Bld: 108 mg/dL — ABNORMAL HIGH (ref 70–99)
Potassium: 4.2 mmol/L (ref 3.5–5.1)
Sodium: 137 mmol/L (ref 135–145)

## 2019-07-14 MED ORDER — NICOTINE 21 MG/24HR TD PT24: MEDICATED_PATCH | TRANSDERMAL | 0 refills | Status: AC

## 2019-07-14 MED ORDER — PANTOPRAZOLE SODIUM 40 MG PO TBEC: 40.0000 mg | DELAYED_RELEASE_TABLET | Freq: Every day | ORAL | 0 refills | Status: DC

## 2019-07-14 MED ORDER — SENNOSIDES-DOCUSATE SODIUM 8.6-50 MG PO TABS: 2.0000 | ORAL_TABLET | Freq: Every day | ORAL | 0 refills | Status: DC

## 2019-07-14 MED ORDER — POLYETHYLENE GLYCOL 3350 17 G PO PACK: 17.0000 g | PACK | Freq: Every day | ORAL | 0 refills | Status: AC | PRN

## 2019-07-14 MED ORDER — OXYCODONE-ACETAMINOPHEN 5-325 MG PO TABS: 1.0000 | ORAL_TABLET | Freq: Four times a day (QID) | ORAL | 0 refills | Status: DC | PRN

## 2019-07-14 MED ORDER — SODIUM CHLORIDE 0.9% FLUSH: 5.0000 mL | Freq: Three times a day (TID) | INTRAVENOUS | 3 refills | Status: DC

## 2019-07-14 MED ORDER — METOPROLOL TARTRATE 25 MG PO TABS: 25.0000 mg | ORAL_TABLET | Freq: Two times a day (BID) | ORAL | 0 refills | Status: DC

## 2019-07-14 NOTE — Telephone Encounter (Signed)
I called and spoke to [facility].  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 Recommend systemic palliative chemotherapy. [name] informs me that patient desires treatment. Will  refer to surgeon for chemotherapy. Also recommend PET, US guided biopsy of neck mass. She agrees with the plan.   Kristi, as we discussed, please schedule PET, US neck mass biopsy  please also refer to Dr.Pyscoya for medi port placement.   Please schedule him to see me for post hospitalization follow up-lab MD-  also see Palliative care too.   Daughter [name] prefers to be the main contact regarding all his appointment. [phone].  Thank you very much,   Talbert Cage

## 2019-07-14 NOTE — Care Management Important Message (Signed)
Important Message  Patient Details  Name: Johnny Martin MRN: [id number] Date of Birth: 10/29/1950   Medicare Important Message Given:  Yes  Initial Important Message signed today.    [name] 07/14/2019, 11:47 AM

## 2019-07-14 NOTE — Progress Notes (Signed)
Patient discharged home per MD orders. Patient and daughter educated on dressing changes and flushing nephrostomy tube. All discharge instructions given and all questions answered. Prescription given to patient.

## 2019-07-14 NOTE — Telephone Encounter (Signed)
Daughter [name] called asking for Dr Tasia Catchings to return her call to discuss treatment plan so that she can get things scheduled. Please return her call [phone]

## 2019-07-14 NOTE — TOC Transition Note (Signed)
Transition of Care [facility]) - CM/SW Discharge Note   Patient Details  Name: Johnny Martin MRN: [id number] Date of Birth: 04-29-1951  [facility] CM/SW Contact:  Candie Chroman, LCSW Phone Number: 07/14/2019, 8:47 AM   Clinical Narrative:  CSW met with patient. No supports at bedside. CSW introduced role and explained that discharge planning would be discussed. MD placed order for home health nurse. Patient is not interested in that and stated he wants his daughter to get the education to manage his nephrostomy tube. She will be picking him up today and he lives with her. CSW encouraged patient to notify CSW before he leaves if he changes his mind or notify his PCP if he decides he needs a nurse after discharge. Patient expressed understanding. RN said MD already made her aware this morning. No further concerns. Patient has orders to discharge home today. CSW signing off.   Final next level of care: Home/Self Care Barriers to Discharge: Barriers Resolved   Patient Goals and CMS Choice     Choice offered to / list presented to : NA  Discharge Placement                       Discharge Plan and Services     Post Acute Care Choice: NA          DME Arranged: N/A         HH Arranged: NA          Social Determinants of Health (SDOH) Interventions     Readmission Risk Interventions No flowsheet data found.

## 2019-07-14 NOTE — Progress Notes (Signed)
Urology Inpatient Progress Note  Subjective: Johnny Martin is a 68 y.o. male admitted on 07/11/2019 with right hydroureteronephrosis secondary to distal ureteral compression from presumed metastatic bladder cancer.  Omental biopsy yesterday.  Creatinine 1.24 today, down from 1.41 yesterday.  He remains afebrile with hypertension to the 160s over 80s.  Right PCN in place draining clear yellow urine.  Reports continued improvement of right flank pain.  He states he had a bowel movement this morning.  No other complaints.  Anti-infectives: Anti-infectives (From admission, onward)   Start     Dose/Rate Route Frequency Ordered Stop   07/12/19 1145  ceFAZolin (ANCEF) IVPB 1 g/50 mL premix     1 g 100 mL/hr over 30 Minutes Intravenous  Once 07/12/19 1136 07/13/19 1614      Current Facility-Administered Medications  Medication Dose Route Frequency Provider Last Rate Last Dose  . docusate sodium (COLACE) capsule 100 mg  100 mg Oral BID PRN Vaughan Basta, MD      . influenza vaccine adjuvanted (FLUAD) injection 0.5 mL  0.5 mL Intramuscular Tomorrow-1000 Vaughan Basta, MD      . metoprolol tartrate (LOPRESSOR) tablet 25 mg  25 mg Oral BID Vaughan Basta, MD   25 mg at 07/14/19 0912  . nicotine (NICODERM CQ - dosed in mg/24 hours) patch 21 mg  21 mg Transdermal Daily Loletha Grayer, MD   21 mg at 07/13/19 2022  . oxyCODONE-acetaminophen (PERCOCET/ROXICET) 5-325 MG per tablet 1-2 tablet  1-2 tablet Oral Q4H PRN Loletha Grayer, MD   1 tablet at 07/14/19 0912  . pantoprazole (PROTONIX) EC tablet 40 mg  40 mg Oral Daily Vaughan Basta, MD   40 mg at 07/14/19 0912  . polyethylene glycol (MIRALAX / GLYCOLAX) packet 17 g  17 g Oral Daily PRN Earlie Server, MD   17 g at 07/13/19 2129  . senna-docusate (Senokot-S) tablet 2 tablet  2 tablet Oral Daily Earlie Server, MD   2 tablet at 07/14/19 0911  . sodium chloride flush (NS) 0.9 % injection 5 mL  5 mL Intracatheter Q8H Register,  Anola Gurney., MD   5 mL at 07/14/19 0523     Objective: Vital signs in last 24 hours: Temp:  [97.3 F (36.3 C)-98.3 F (36.8 C)] 98.3 F (36.8 C) (10/01 0829) Pulse Rate:  [79-95] 95 (10/01 0829) Resp:  [14-23] 14 (10/01 0829) BP: (140-169)/(77-85) 169/85 (10/01 0829) SpO2:  [93 %-98 %] 98 % (10/01 0829)  Intake/Output from previous day: 09/30 0701 - 10/01 0700 In: 1502.1 [P.O.:960; I.V.:282.1] Out: 800 [Urine:800] Intake/Output this shift: Total I/O In: 600 [P.O.:600] Out: 150 [Urine:150]  Physical Exam Vitals signs and nursing note reviewed.  Constitutional:      General: He is not in acute distress.    Appearance: He is not ill-appearing, toxic-appearing or diaphoretic.  HENT:     Head: Normocephalic and atraumatic.  Pulmonary:     Effort: Pulmonary effort is normal. No respiratory distress.  Skin:    General: Skin is warm and dry.  Neurological:     Mental Status: He is alert and oriented to person, place, and time.  Psychiatric:        Mood and Affect: Mood normal.        Behavior: Behavior normal.    Lab Results:  Recent Labs    07/11/19 1130 07/12/19 0417  WBC 12.6* 9.2  HGB 14.9 12.7*  HCT 43.4 37.7*  PLT 245 189   BMET Recent Labs    07/13/19  0746 07/14/19 0439  NA 135 137  K 4.0 4.2  CL 101 100  CO2 24 27  GLUCOSE 100* 108*  BUN 24* 24*  CREATININE 1.41* 1.24  CALCIUM 8.5* 8.4*   PT/INR Recent Labs    07/12/19 0417  LABPROT 12.6  INR 1.0   Assessment & Plan: Patient improving following placement of right PCN for right hydroureteronephrosis.  No new recommendations today.  Will defer to oncology team for treatment of his likely metastatic bladder cancer.  Debroah Loop, PA-C 07/14/2019

## 2019-07-14 NOTE — Discharge Summary (Signed)
[facility] NAME: Johnny Martin    MR#:  [id number]  DATE OF BIRTH:  1951/02/15  DATE OF ADMISSION:  07/11/2019 ADMITTING PHYSICIAN: Vaughan Basta, MD  DATE OF DISCHARGE: 07/14/2019 12:26 PM  PRIMARY CARE PHYSICIAN: Dr Earlie Server    ADMISSION DIAGNOSIS:  Bladder tumor [D49.4] Gross hematuria [R31.0] Bladder mass [N32.89] Peritoneal carcinomatosis ([place]) [C78.6, C80.1] Hydronephrosis due to obstruction of ureter [N13.2]  DISCHARGE DIAGNOSIS:  Principal Problem:   Bladder mass Active Problems:   Hydronephrosis due to obstruction of ureter   Gross hematuria   Peritoneal carcinomatosis ([place])   Mass of left side of neck   AKI (acute kidney injury) ([place])   Drug-induced constipation   SECONDARY DIAGNOSIS:   Past Medical History:  Diagnosis Date  . Cancer [facility])    Bladder cancer    HOSPITAL COURSE:   1.  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  Appreciate oncology and urology consultations.  Patient had an of right nephrostomy tube placed for hydronephrosis.  Interventional radiology biopsy yesterday of abdominal mass.  Patient prescribed PRN pain medication.  Follow-up with oncology to go over biopsy results and set up treatment plan. 2.  Hydronephrosis and hydroureter on the right.  Nephrostomy tube placed by interventional radiology.  Nursing staff to teach daughter how to flush and bandage and care for nephrostomy tube.  Was hoping to set up home health but the patient refused.  The nephrostomy tube needs to be changed every few months.  Follow-up with Dr. Bernardo Heater as outpatient. 3.  Acute kidney injury on chronic kidney disease stage III. Creatinine improved from 1.55 down to 1.24. 4.  Hyponatremia.  Improved with IV fluids. 5.  Hematuria secondary to bladder mass 6.  Tobacco abuse on nicotine patch. 7.  Left cervical mass.  Will need follow-up with Dr. Tasia Catchings oncology.  Will likely be set up for PET CT scan as  outpatient. 8.  Hypertension prescribed metoprolol.  DISCHARGE CONDITIONS:   Satisfactory  CONSULTS OBTAINED:  Oncology Urology Interventional radiology  DRUG ALLERGIES:  No Known Allergies  DISCHARGE MEDICATIONS:   Allergies as of 07/14/2019   No Known Allergies     Medication List    STOP taking these medications   acyclovir 400 MG tablet Commonly known as: Zovirax     TAKE these medications   metoprolol tartrate 25 MG tablet Commonly known as: LOPRESSOR Take 1 tablet (25 mg total) by mouth 2 (two) times daily.   nicotine 21 mg/24hr patch Commonly known as: NICODERM CQ - dosed in mg/24 hours One patch transdermal chest wall daily (okay to substitute generic patch)   oxyCODONE-acetaminophen 5-325 MG tablet Commonly known as: PERCOCET/ROXICET Take 1 tablet by mouth every 6 (six) hours as needed for moderate pain or severe pain.   pantoprazole 40 MG tablet Commonly known as: PROTONIX Take 1 tablet (40 mg total) by mouth daily. Start taking on: July 15, 2019   polyethylene glycol 17 g packet Commonly known as: MIRALAX / GLYCOLAX Take 17 g by mouth daily as needed for moderate constipation or severe constipation.   senna-docusate 8.6-50 MG tablet Commonly known as: Senokot-S Take 2 tablets by mouth daily. Start taking on: July 15, 2019   sodium chloride flush 0.9 % Soln Commonly known as: NS 5 mLs by Intracatheter route every 8 (eight) hours.        DISCHARGE INSTRUCTIONS:   Follow-up with Dr. Tasia Catchings early next week. Follow-up Dr. Bernardo Heater in a few weeks  If you experience worsening of your admission symptoms, develop shortness of breath, life threatening emergency, suicidal or homicidal thoughts you must seek medical attention immediately by calling 911 or calling your MD immediately  if symptoms less severe.  You Must read complete instructions/literature along with all the possible adverse reactions/side effects for all the Medicines you take and  that have been prescribed to you. Take any new Medicines after you have completely understood and accept all the possible adverse reactions/side effects.   Please note  You were cared for by a hospitalist during your hospital stay. If you have any questions about your discharge medications or the care you received while you were in the hospital after you are discharged, you can call the unit and asked to speak with the hospitalist on call if the hospitalist that took care of you is not available. Once you are discharged, your primary care physician will handle any further medical issues. Please note that NO REFILLS for any discharge medications will be authorized once you are discharged, as it is imperative that you return to your primary care physician (or establish a relationship with a primary care physician if you do not have one) for your aftercare needs so that they can reassess your need for medications and monitor your lab values.    Today   CHIEF COMPLAINT:   Chief Complaint  Patient presents with  . Flank Pain    HISTORY OF PRESENT ILLNESS:  Johnny Martin  is a 68 y.o. male presented with flank pain   VITAL SIGNS:  Blood pressure (!) 169/85, pulse 95, temperature 98.3 F (36.8 C), temperature source Oral, resp. rate 14, height 6\' 1"  (1.854 m), weight 72.6 kg, SpO2 98 %.  I/O:    Intake/Output Summary (Last 24 hours) at 07/14/2019 1521 Last data filed at 07/14/2019 1143 Gross per 24 hour  Intake 1612.08 ml  Output 1200 ml  Net 412.08 ml    PHYSICAL EXAMINATION:  GENERAL:  68 y.o.-year-old patient lying in the bed with no acute distress.  EYES: Pupils equal, round, reactive to light and accommodation. No scleral icterus. Extraocular muscles intact.  HEENT: Head atraumatic, normocephalic. Oropharynx and nasopharynx clear.  NECK:  Supple, no jugular venous distention.  Left neck mass LUNGS: Normal breath sounds bilaterally, no wheezing, rales,rhonchi or crepitation. No use  of accessory muscles of respiration.  CARDIOVASCULAR: S1, S2 normal. No murmurs, rubs, or gallops.  ABDOMEN: Soft, non-tender, non-distended. Bowel sounds present. No organomegaly or mass.  EXTREMITIES: No pedal edema, cyanosis, or clubbing.  NEUROLOGIC: Cranial nerves II through XII are intact. Muscle strength 5/5 in all extremities. Sensation intact. Gait not checked.  PSYCHIATRIC: The patient is alert and oriented x 3.  SKIN: No obvious rash, lesion, or ulcer.   DATA REVIEW:   CBC Recent Labs  Lab 07/12/19 0417  WBC 9.2  HGB 12.7*  HCT 37.7*  PLT 189    Chemistries  Recent Labs  Lab 07/12/19 0417  07/14/19 0439  NA 130*   < > 137  K 4.5   < > 4.2  CL 96*   < > 100  CO2 24   < > 27  GLUCOSE 94   < > 108*  BUN 25*   < > 24*  CREATININE 1.49*   < > 1.24  CALCIUM 8.4*   < > 8.4*  AST 23  --   --   ALT 13  --   --   ALKPHOS 66  --   --  BILITOT 0.9  --   --    < > = values in this interval not displayed.     Microbiology Results  Results for orders placed or performed during the hospital encounter of 07/11/19  SARS Coronavirus 2 [facility] order, Performed in [facility] hospital lab) Nasopharyngeal Nasopharyngeal Swab     Status: None   Collection Time: 07/11/19  2:43 PM   Specimen: Nasopharyngeal Swab  Result Value Ref Range Status   SARS Coronavirus 2 NEGATIVE NEGATIVE Final    Comment: (NOTE) If result is NEGATIVE SARS-CoV-2 target nucleic acids are NOT DETECTED. The SARS-CoV-2 RNA is generally detectable in upper and lower  respiratory specimens during the acute phase of infection. The lowest  concentration of SARS-CoV-2 viral copies this assay can detect is 250  copies / mL. A negative result does not preclude SARS-CoV-2 infection  and should not be used as the sole basis for treatment or other  patient management decisions.  A negative result may occur with  improper specimen collection / handling, submission of specimen other  than nasopharyngeal swab,  presence of viral mutation(s) within the  areas targeted by this assay, and inadequate number of viral copies  (<250 copies / mL). A negative result must be combined with clinical  observations, patient history, and epidemiological information. If result is POSITIVE SARS-CoV-2 target nucleic acids are DETECTED. The SARS-CoV-2 RNA is generally detectable in upper and lower  respiratory specimens dur ing the acute phase of infection.  Positive  results are indicative of active infection with SARS-CoV-2.  Clinical  correlation with patient history and other diagnostic information is  necessary to determine patient infection status.  Positive results do  not rule out bacterial infection or co-infection with other viruses. If result is PRESUMPTIVE POSTIVE SARS-CoV-2 nucleic acids MAY BE PRESENT.   A presumptive positive result was obtained on the submitted specimen  and confirmed on repeat testing.  While 2019 novel coronavirus  (SARS-CoV-2) nucleic acids may be present in the submitted sample  additional confirmatory testing may be necessary for epidemiological  and / or clinical management purposes  to differentiate between  SARS-CoV-2 and other Sarbecovirus currently known to infect humans.  If clinically indicated additional testing with an alternate test  methodology [phone]) is advised. The SARS-CoV-2 RNA is generally  detectable in upper and lower respiratory sp ecimens during the acute  phase of infection. The expected result is Negative. Fact Sheet for Patients:  StrictlyIdeas.no Fact Sheet for Healthcare Providers: BankingDealers.co.za This test is not yet approved or cleared by the [place] FDA and has been authorized for detection and/or diagnosis of SARS-CoV-2 by FDA under an Emergency Use Authorization (EUA).  This EUA will remain in effect (meaning this test can be used) for the duration of the COVID-19 declaration under  Section 564(b)(1) of the Act, 21 U.S.C. section 360bbb-3(b)(1), unless the authorization is terminated or revoked sooner. Performed at [facility], [address] [place], [place] [address]   Urine culture     Status: None   Collection Time: 07/12/19 11:57 AM   Specimen: Urine, Catheterized  Result Value Ref Range Status   Specimen Description   Final    URINE, CATHETERIZED Performed at [facility], [place] [address], [place], [place] [address]    Special Requests   Final    NONE Performed at [facility], [address] [place], [place] [address]    Culture   Final    NO GROWTH Performed  at [facility], [place] [address] [place], [place] [address]    Report Status 07/13/2019 FINAL  Final    RADIOLOGY:  Ct Guided Needle Placement  Result Date: 07/13/2019 INDICATION: History of bladder cancer, now with peritoneal masses worrisome for metastatic disease. Please perform CT-guided biopsy for tissue diagnostic purposes. EXAM: CT GUIDANCE NEEDLE PLACEMENT COMPARISON:  CT abdomen and pelvis - 07/11/2019 MEDICATIONS: None. ANESTHESIA/SEDATION: Fentanyl 100 mcg IV; Versed 2 mg IV Sedation time: 10 minutes; The patient was continuously monitored during the procedure by the interventional radiology nurse under my direct supervision. CONTRAST:  None. COMPLICATIONS: None immediate. PROCEDURE: Informed consent was obtained from the patient following an explanation of the procedure, risks, benefits and alternatives. A time out was performed prior to the initiation of the procedure. The patient was positioned supine on the CT table and a limited CT was performed for procedural planning demonstrating unchanged size and appearance of the infiltrative mass involving the left pericolic gutter with dominant component measuring at least 12.3 x 6.2 cm (image 26, series 2). The procedure was planned. The operative site was prepped and draped in the usual sterile fashion.  Appropriate trajectory was confirmed with a 22 gauge spinal needle after the adjacent tissues were anesthetized with 1% Lidocaine with epinephrine. Under intermittent CT guidance, a 17 gauge coaxial needle was advanced into the peripheral aspect of the mass. Appropriate positioning was confirmed and 5 core needle biopsy samples were obtained with an 18 gauge core needle biopsy device. The co-axial needle was removed following the administration of a Gel-Foam slurry and superficial hemostasis was achieved with manual compression. A limited postprocedural CT was negative for hemorrhage or additional complication. A dressing was placed. The patient tolerated the procedure well without immediate postprocedural complication. IMPRESSION: Technically successful CT guided core needle biopsy of infiltrative mass within the left pericolic gutter. Electronically Signed   By: Sandi Mariscal M.D.   On: 07/13/2019 11:35     Management plans discussed with the patient, and he is in agreement.  Spoke with daughter at the bedside yesterday afternoon.  Called and left a message this morning.  CODE STATUS:     Code Status Orders  (From admission, onward)         Start     Ordered   07/11/19 1700  Do not attempt resuscitation (DNR)  Continuous    Question Answer Comment  In the event of cardiac or respiratory ARREST Do not call a "code blue"   In the event of cardiac or respiratory ARREST Do not perform Intubation, CPR, defibrillation or ACLS   In the event of cardiac or respiratory ARREST Use medication by any route, position, wound care, and other measures to relive pain and suffering. May use oxygen, suction and manual treatment of airway obstruction as needed for comfort.      07/11/19 1659        Code Status History    This patient has a current code status but no historical code status.   Advance Care Planning Activity      TOTAL TIME TAKING CARE OF THIS PATIENT: 35 minutes.    Loletha Grayer M.D  on 07/14/2019 at 3:21 PM  Between 7am to 6pm - Pager - [phone]  After 6pm go to [email or web address] - password [email or web address] [facility]  [phone]  CC: Primary care physician; Dr Earlie Server

## 2019-07-14 NOTE — Discharge Instructions (Signed)
Please flush nephrostomy tube thee times a day with 35ml sterile saline, swab with alcohol swab  Medical supplies needed: alcohol swabs, sterile split gauze, tegaderm or paper taper to secure gauze.

## 2019-07-15 ENCOUNTER — Telehealth: Payer: Self-pay

## 2019-07-15 ENCOUNTER — Other Ambulatory Visit: Payer: Self-pay

## 2019-07-15 DIAGNOSIS — C786 Secondary malignant neoplasm of retroperitoneum and peritoneum: Secondary | ICD-10-CM

## 2019-07-15 DIAGNOSIS — C7910 Secondary malignant neoplasm of unspecified urinary organs: Secondary | ICD-10-CM

## 2019-07-15 LAB — CYTOLOGY - NON PAP

## 2019-07-15 NOTE — Telephone Encounter (Signed)
Tried reaching patient at number listed, unsuccessful. Left message on daughter VM to have patient return call to office as soon as possible today.  Would like to schedule an appointment today of next week per Dr.Piscoya.

## 2019-07-15 NOTE — Telephone Encounter (Signed)
Heather, I will order and schedule PET. I will also arrange the biopsy and send the port referral. If you can schedule the hospital f/u appt and labs, palliative care.

## 2019-07-15 NOTE — Telephone Encounter (Signed)
No problem!  I have sent a scheduling pool message.  Thanks!

## 2019-07-15 NOTE — Telephone Encounter (Signed)
Called and spoke with daughter, [name]. PET scan arranged for 10/12, with [facility]. Instructed on prep for the scan. Invasive checklist sent to special scheduling to arrange US guided biopsy of left neck mass. I will call [name] once this is arranged. Referral also placed for Dr. Hampton Abbot to have port placed. She will be contacted with an appointment to follow up with Dr. Tasia Catchings for post hospital f/u and will also see palliative care. Referral placed for palliative care.

## 2019-07-15 NOTE — Telephone Encounter (Signed)
What part of this do I need to work on?

## 2019-07-19 ENCOUNTER — Other Ambulatory Visit: Payer: Self-pay

## 2019-07-19 ENCOUNTER — Encounter: Payer: Self-pay | Admitting: Surgery

## 2019-07-19 ENCOUNTER — Ambulatory Visit (INDEPENDENT_AMBULATORY_CARE_PROVIDER_SITE_OTHER): Payer: Medicare Other | Admitting: Surgery

## 2019-07-19 ENCOUNTER — Other Ambulatory Visit: Payer: Self-pay | Admitting: Internal Medicine

## 2019-07-19 ENCOUNTER — Other Ambulatory Visit
Admission: RE | Admit: 2019-07-19 | Discharge: 2019-07-19 | Disposition: A | Discharge status: Home / Self Care | Payer: Medicare Other | Source: Ambulatory Visit | Attending: Surgery | Admitting: Surgery

## 2019-07-19 VITALS — BP 137/83 | HR 83 | Temp 97.5°F | Resp 16 | Ht 73.0 in | Wt 154.0 lb

## 2019-07-19 DIAGNOSIS — Z20828 Contact with and (suspected) exposure to other viral communicable diseases: Secondary | ICD-10-CM | POA: Diagnosis not present

## 2019-07-19 DIAGNOSIS — C801 Malignant (primary) neoplasm, unspecified: Secondary | ICD-10-CM

## 2019-07-19 DIAGNOSIS — R221 Localized swelling, mass and lump, neck: Secondary | ICD-10-CM

## 2019-07-19 DIAGNOSIS — Z01812 Encounter for preprocedural laboratory examination: Secondary | ICD-10-CM | POA: Diagnosis present

## 2019-07-19 DIAGNOSIS — N3289 Other specified disorders of bladder: Secondary | ICD-10-CM

## 2019-07-19 DIAGNOSIS — C7910 Secondary malignant neoplasm of unspecified urinary organs: Secondary | ICD-10-CM

## 2019-07-19 DIAGNOSIS — C786 Secondary malignant neoplasm of retroperitoneum and peritoneum: Secondary | ICD-10-CM | POA: Diagnosis not present

## 2019-07-19 LAB — SARS CORONAVIRUS 2 (TAT 6-24 HRS): SARS Coronavirus 2: NEGATIVE

## 2019-07-19 NOTE — Progress Notes (Signed)
Call placed to scheduling for follow up on biopsy request made on 07/15/19. Per Pamala Hurry order came across as liver biopsy. New order entered for left neck mass. Previous order placed came up as a liver biopsy. Patient has a palpable left neck mass that physician is requesting biopsy of.

## 2019-07-19 NOTE — Patient Instructions (Addendum)
Our surgery scheduler will call you to schedule your surgery. Please have the Blue surgery sheet available when she calls.  Implanted [place] Guide An implanted port is a device that is placed under the skin. It is usually placed in the chest. The device can be used to give IV medicine, to take blood, or for dialysis. You may have an implanted port if:  You need IV medicine that would be irritating to the small veins in your hands or arms.  You need IV medicines, such as antibiotics, for a long period of time.  You need IV nutrition for a long period of time.  You need dialysis. Having a port means that your health care provider will not need to use the veins in your arms for these procedures. You may have fewer limitations when using a port than you would if you used other types of long-term IVs, and you will likely be able to return to normal activities after your incision heals. An implanted port has two main parts:  Reservoir. The reservoir is the part where a needle is inserted to give medicines or draw blood. The reservoir is round. After it is placed, it appears as a small, raised area under your skin.  Catheter. The catheter is a thin, flexible tube that connects the reservoir to a vein. Medicine that is inserted into the reservoir goes into the catheter and then into the vein. How is my port accessed? To access your port:  A numbing cream may be placed on the skin over the port site.  Your health care provider will put on a mask and sterile gloves.  The skin over your port will be cleaned carefully with a germ-killing soap and allowed to dry.  Your health care provider will gently pinch the port and insert a needle into it.  Your health care provider will check for a blood return to make sure the port is in the vein and is not clogged.  If your port needs to remain accessed to get medicine continuously (constant infusion), your health care provider will place a clear bandage  (dressing) over the needle site. The dressing and needle will need to be changed every week, or as told by your health care provider. What is flushing? Flushing helps keep the port from getting clogged. Follow instructions from your health care provider about how and when to flush the port. Ports are usually flushed with saline solution or a medicine called heparin. The need for flushing will depend on how the port is used:  If the port is only used from time to time to give medicines or draw blood, the port may need to be flushed: ? Before and after medicines have been given. ? Before and after blood has been drawn. ? As part of routine maintenance. Flushing may be recommended every 4-6 weeks.  If a constant infusion is running, the port may not need to be flushed.  Throw away any syringes in a disposal container that is meant for sharp items (sharps container). You can buy a sharps container from a pharmacy, or you can make one by using an empty hard plastic bottle with a cover. How long will my port stay implanted? The port can stay in for as long as your health care provider thinks it is needed. When it is time for the port to come out, a surgery will be done to remove it. The surgery will be similar to the procedure that was done to  put the port in. Follow these instructions at home:   Flush your port as told by your health care provider.  If you need an infusion over several days, follow instructions from your health care provider about how to take care of your port site. Make sure you: ? Wash your hands with soap and water before you change your dressing. If soap and water are not available, use alcohol-based hand sanitizer. ? Change your dressing as told by your health care provider. ? Place any used dressings or infusion bags into a plastic bag. Throw that bag in the trash. ? Keep the dressing that covers the needle clean and dry. Do not get it wet. ? Do not use scissors or sharp  objects near the tube. ? Keep the tube clamped, unless it is being used.  Check your port site every day for signs of infection. Check for: ? Redness, swelling, or pain. ? Fluid or blood. ? Pus or a bad smell.  Protect the skin around the port site. ? Avoid wearing bra straps that rub or irritate the site. ? Protect the skin around your port from seat belts. Place a soft pad over your chest if needed.  Bathe or shower as told by your health care provider. The site may get wet as long as you are not actively receiving an infusion.  Return to your normal activities as told by your health care provider. Ask your health care provider what activities are safe for you.  Carry a medical alert card or wear a medical alert bracelet at all times. This will let health care providers know that you have an implanted port in case of an emergency. Get help right away if:  You have redness, swelling, or pain at the port site.  You have fluid or blood coming from your port site.  You have pus or a bad smell coming from the port site.  You have a fever. Summary  Implanted ports are usually placed in the chest for long-term IV access.  Follow instructions from your health care provider about flushing the port and changing bandages (dressings).  Take care of the area around your port by avoiding clothing that puts pressure on the area, and by watching for signs of infection.  Protect the skin around your port from seat belts. Place a soft pad over your chest if needed.  Get help right away if you have a fever or you have redness, swelling, pain, drainage, or a bad smell at the port site. This information is not intended to replace advice given to you by your health care provider. Make sure you discuss any questions you have with your health care provider. Document Released: 09/29/2005 Document Revised: 01/21/2019 Document Reviewed: 11/01/2016 Elsevier Patient Education  2020 [employer].

## 2019-07-19 NOTE — H&P (View-Only) (Signed)
07/19/2019  Reason for Visit:  Bladder cancer, requiring chemotherapy  Referring Provider:  Earlie Server, MD  History of Present Illness: Johnny Martin is a 68 y.o. male presenting for evaluation for Port-A-Cath placement.  Has a recent diagnosis of bladder cancer and presents emergency room on 07/11/2019 with flank pain and hematuria.  Patient had a CT scan on same day which showed a large bladder neoplasm measuring approximately 9.6 x 9.2 x 7 cm with omental caking and peritoneal carcinomatosis.  Patient was evaluated by oncology have recommended chemotherapy.  He is due for a PET scan on 10/12 and he has been referred to [place] for Port-A-Cath placement with goals of starting chemotherapy next week.  He currently denies any pain or nausea.  He has a right nephrostomy tube.  He reports he is right-handed.  Past Medical History: Past Medical History:  Diagnosis Date  . Cancer [facility])    Bladder cancer     Past Surgical History: Past Surgical History:  Procedure Laterality Date  . BLADDER SURGERY      Home Medications: Prior to Admission medications   Medication Sig Start Date End Date Taking? Authorizing Provider  metoprolol tartrate (LOPRESSOR) 25 MG tablet Take 1 tablet (25 mg total) by mouth 2 (two) times daily. 07/14/19  Yes Wieting, Richard, MD  nicotine (NICODERM CQ - DOSED IN MG/24 HOURS) 21 mg/24hr patch One patch transdermal chest wall daily (okay to substitute generic patch) Patient taking differently: Place 21 mg onto the skin daily.  07/14/19  Yes Wieting, Richard, MD  oxyCODONE-acetaminophen (PERCOCET/ROXICET) 5-325 MG tablet Take 1 tablet by mouth every 6 (six) hours as needed for moderate pain or severe pain. 07/14/19  Yes Wieting, Richard, MD  pantoprazole (PROTONIX) 40 MG tablet Take 1 tablet (40 mg total) by mouth daily. 07/15/19  Yes Wieting, Richard, MD  polyethylene glycol (MIRALAX / GLYCOLAX) 17 g packet Take 17 g by mouth daily as needed for moderate constipation or severe  constipation. Patient taking differently: Take 17 g by mouth daily as needed for moderate constipation or severe constipation (constipation.).  07/14/19  Yes Wieting, Richard, MD  senna-docusate (SENOKOT-S) 8.6-50 MG tablet Take 2 tablets by mouth daily. 07/15/19  Yes Wieting, Richard, MD  sodium chloride flush (NS) 0.9 % SOLN 5 mLs by Intracatheter route every 8 (eight) hours. 07/14/19  Yes Wieting, Richard, MD  acetaminophen (TYLENOL) 325 MG tablet Take 325 mg by mouth every 4 (four) hours as needed for headache.    [provider]    Allergies: No Known Allergies  Social History:  reports that he quit smoking 6 days ago. He smoked 0.50 packs per day. He has never used smokeless tobacco. He reports that he does not drink alcohol or use drugs.   Family History: Family History  Problem Relation Age of Onset  . Cervical cancer Sister     Review of Systems: Review of Systems  Constitutional: Negative for chills and fever.  Respiratory: Negative for shortness of breath.   Cardiovascular: Negative for chest pain.  Gastrointestinal: Negative for abdominal pain, nausea and vomiting.  Genitourinary: Negative for dysuria.  Musculoskeletal: Negative for myalgias.  Skin: Negative for rash.  Neurological: Negative for dizziness.  Psychiatric/Behavioral: Negative for depression.    Physical Exam BP 137/83   Pulse 83   Temp (!) 97.5 F (36.4 C) (Temporal)   Resp 16   Ht 6\' 1"  (1.854 m)   Wt 154 lb (69.9 kg)   SpO2 99%   BMI 20.32  kg/m  CONSTITUTIONAL: No acute distress HEENT:  Normocephalic, atraumatic, extraocular motion intact. NECK: Trachea is midline, and there is no jugular venous distension.  RESPIRATORY:  Lungs are clear, and breath sounds are equal bilaterally. Normal respiratory effort without pathologic use of accessory muscles. CARDIOVASCULAR: Heart is regular without murmurs, gallops, or rubs. GI: The abdomen is soft, nondistended, nontender.  MUSCULOSKELETAL:   Normal muscle strength and tone in all four extremities.  No peripheral edema or cyanosis. SKIN: Skin turgor is normal. There are no pathologic skin lesions.  NEUROLOGIC:  Motor and sensation is grossly normal.  Cranial nerves are grossly intact. PSYCH:  Alert and oriented to person, place and time. Affect is normal.  Laboratory Analysis: Labs on 07/12/2019. Sodium 130, potassium 4.5, chloride 96, CO2 24, BUN 25, creatinine 1.49.  Total bilirubin 0.9, AST 23, ALT 13, alkaline phosphatase 66.  Albumin 3.1.  White blood cell count 9.2, hemoglobin 12.7, hematocrit 37.7, platelet 189.  INR 1.0  Imaging: CT scan abdomen/pelvis on 07/11/2019: 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 Pelvic and retroperitoneal adenopathy. 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 Small amount of ascites. Bibasilar pulmonary nodules question metastases. RIGHT hydronephrosis and hydroureter secondary to distal ureteral obstruction likely by tumor.  Assessment and Plan: This is a 68 y.o. male with new diagnosis of bladder cancer requiring chemotherapy and Port-A-Cath placement for this.  -Discussed with the patient that we would attempt a left subclavian Port-A-Cath placement.  Discussed with the patient the risks of bleeding, infection, injury to surrounding structures including pneumothorax and bleeding.  Patient is willing to proceed.  Patient is right-handed so we will place this on the left chest.  Discussed postop restrictions and instructions.  Patient will be scheduled for 10/8 so that he can start with chemotherapy next week.  Patient understands that he will need a COVID test prior to surgery.  Face-to-face time spent with the patient and care providers was 60 minutes, with more than 50% of the time spent counseling, educating, and coordinating  care of the patient.     Melvyn Neth, [facility]

## 2019-07-19 NOTE — Progress Notes (Signed)
07/19/2019  Reason for Visit:  Bladder cancer, requiring chemotherapy  Referring Provider:  Earlie Server, MD  History of Present Illness: Johnny Martin is a 68 y.o. male presenting for evaluation for Port-A-Cath placement.  Has a recent diagnosis of bladder cancer and presents emergency room on 07/11/2019 with flank pain and hematuria.  Patient had a CT scan on same day which showed a large bladder neoplasm measuring approximately 9.6 x 9.2 x 7 cm with omental caking and peritoneal carcinomatosis.  Patient was evaluated by oncology have recommended chemotherapy.  He is due for a PET scan on 10/12 and he has been referred to [place] for Port-A-Cath placement with goals of starting chemotherapy next week.  He currently denies any pain or nausea.  He has a right nephrostomy tube.  He reports he is right-handed.  Past Medical History: Past Medical History:  Diagnosis Date  . Cancer [facility])    Bladder cancer     Past Surgical History: Past Surgical History:  Procedure Laterality Date  . BLADDER SURGERY      Home Medications: Prior to Admission medications   Medication Sig Start Date End Date Taking? Authorizing Provider  metoprolol tartrate (LOPRESSOR) 25 MG tablet Take 1 tablet (25 mg total) by mouth 2 (two) times daily. 07/14/19  Yes Wieting, Richard, MD  nicotine (NICODERM CQ - DOSED IN MG/24 HOURS) 21 mg/24hr patch One patch transdermal chest wall daily (okay to substitute generic patch) Patient taking differently: Place 21 mg onto the skin daily.  07/14/19  Yes Wieting, Richard, MD  oxyCODONE-acetaminophen (PERCOCET/ROXICET) 5-325 MG tablet Take 1 tablet by mouth every 6 (six) hours as needed for moderate pain or severe pain. 07/14/19  Yes Wieting, Richard, MD  pantoprazole (PROTONIX) 40 MG tablet Take 1 tablet (40 mg total) by mouth daily. 07/15/19  Yes Wieting, Richard, MD  polyethylene glycol (MIRALAX / GLYCOLAX) 17 g packet Take 17 g by mouth daily as needed for moderate constipation or severe  constipation. Patient taking differently: Take 17 g by mouth daily as needed for moderate constipation or severe constipation (constipation.).  07/14/19  Yes Wieting, Richard, MD  senna-docusate (SENOKOT-S) 8.6-50 MG tablet Take 2 tablets by mouth daily. 07/15/19  Yes Wieting, Richard, MD  sodium chloride flush (NS) 0.9 % SOLN 5 mLs by Intracatheter route every 8 (eight) hours. 07/14/19  Yes Wieting, Richard, MD  acetaminophen (TYLENOL) 325 MG tablet Take 325 mg by mouth every 4 (four) hours as needed for headache.    [provider]    Allergies: No Known Allergies  Social History:  reports that he quit smoking 6 days ago. He smoked 0.50 packs per day. He has never used smokeless tobacco. He reports that he does not drink alcohol or use drugs.   Family History: Family History  Problem Relation Age of Onset  . Cervical cancer Sister     Review of Systems: Review of Systems  Constitutional: Negative for chills and fever.  Respiratory: Negative for shortness of breath.   Cardiovascular: Negative for chest pain.  Gastrointestinal: Negative for abdominal pain, nausea and vomiting.  Genitourinary: Negative for dysuria.  Musculoskeletal: Negative for myalgias.  Skin: Negative for rash.  Neurological: Negative for dizziness.  Psychiatric/Behavioral: Negative for depression.    Physical Exam BP 137/83   Pulse 83   Temp (!) 97.5 F (36.4 C) (Temporal)   Resp 16   Ht 6\' 1"  (1.854 m)   Wt 154 lb (69.9 kg)   SpO2 99%   BMI 20.32  kg/m  CONSTITUTIONAL: No acute distress HEENT:  Normocephalic, atraumatic, extraocular motion intact. NECK: Trachea is midline, and there is no jugular venous distension.  RESPIRATORY:  Lungs are clear, and breath sounds are equal bilaterally. Normal respiratory effort without pathologic use of accessory muscles. CARDIOVASCULAR: Heart is regular without murmurs, gallops, or rubs. GI: The abdomen is soft, nondistended, nontender.  MUSCULOSKELETAL:   Normal muscle strength and tone in all four extremities.  No peripheral edema or cyanosis. SKIN: Skin turgor is normal. There are no pathologic skin lesions.  NEUROLOGIC:  Motor and sensation is grossly normal.  Cranial nerves are grossly intact. PSYCH:  Alert and oriented to person, place and time. Affect is normal.  Laboratory Analysis: Labs on 07/12/2019. Sodium 130, potassium 4.5, chloride 96, CO2 24, BUN 25, creatinine 1.49.  Total bilirubin 0.9, AST 23, ALT 13, alkaline phosphatase 66.  Albumin 3.1.  White blood cell count 9.2, hemoglobin 12.7, hematocrit 37.7, platelet 189.  INR 1.0  Imaging: CT scan abdomen/pelvis on 07/11/2019: 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 Pelvic and retroperitoneal adenopathy. 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 Small amount of ascites. Bibasilar pulmonary nodules question metastases. RIGHT hydronephrosis and hydroureter secondary to distal ureteral obstruction likely by tumor.  Assessment and Plan: This is a 68 y.o. male with new diagnosis of bladder cancer requiring chemotherapy and Port-A-Cath placement for this.  -Discussed with the patient that we would attempt a left subclavian Port-A-Cath placement.  Discussed with the patient the risks of bleeding, infection, injury to surrounding structures including pneumothorax and bleeding.  Patient is willing to proceed.  Patient is right-handed so we will place this on the left chest.  Discussed postop restrictions and instructions.  Patient will be scheduled for 10/8 so that he can start with chemotherapy next week.  Patient understands that he will need a COVID test prior to surgery.  Face-to-face time spent with the patient and care providers was 60 minutes, with more than 50% of the time spent counseling, educating, and coordinating  care of the patient.     Melvyn Neth, [facility]

## 2019-07-20 ENCOUNTER — Other Ambulatory Visit: Payer: Self-pay

## 2019-07-20 ENCOUNTER — Telehealth: Payer: Self-pay | Admitting: Surgery

## 2019-07-20 ENCOUNTER — Encounter: Payer: Self-pay | Admitting: Oncology

## 2019-07-20 MED ORDER — CEFAZOLIN SODIUM-DEXTROSE 2-4 GM/100ML-% IV SOLN
2.0000 g | INTRAVENOUS | Status: AC
  Administered 2019-07-21: 15:00:00 2 g via INTRAVENOUS

## 2019-07-20 NOTE — Progress Notes (Signed)
Patient unable to answer phone daughter provided information for screening.

## 2019-07-20 NOTE — Telephone Encounter (Signed)
Pt has been advised of pre admission date/time, Covid Testing date and Surgery date.  Surgery Date: 07/21/19 with Dr Jordan Likes subclavian port a cath placement.  Preadmission Testing Date: patient informed to arrive at the [place] at 12:00pm the day of surgery 07/21/19. Covid Testing Date: Completed on 07/19/19 - patient advised to go to the [place] ([place])  [employer] Video sent via [employer] [facility] [employer] and [employer].

## 2019-07-21 ENCOUNTER — Other Ambulatory Visit: Payer: Self-pay

## 2019-07-21 ENCOUNTER — Inpatient Hospital Stay: Payer: Medicare Other | Admitting: Hospice and Palliative Medicine

## 2019-07-21 ENCOUNTER — Ambulatory Visit: Payer: Medicare Other | Admitting: Anesthesiology

## 2019-07-21 ENCOUNTER — Ambulatory Visit: Payer: Medicare Other

## 2019-07-21 ENCOUNTER — Inpatient Hospital Stay: Payer: Medicare Other | Attending: Oncology

## 2019-07-21 ENCOUNTER — Encounter: Payer: Self-pay | Admitting: Oncology

## 2019-07-21 ENCOUNTER — Ambulatory Visit
Admission: RE | Admit: 2019-07-21 | Discharge: 2019-07-21 | Disposition: A | Discharge status: Home / Self Care | Payer: Medicare Other | Attending: Surgery | Admitting: Surgery

## 2019-07-21 ENCOUNTER — Encounter
Admission: RE | Disposition: A | Discharge status: Home / Self Care | Payer: Self-pay | Source: Home / Self Care | Attending: Surgery

## 2019-07-21 ENCOUNTER — Inpatient Hospital Stay: Payer: Medicare Other | Admitting: Oncology

## 2019-07-21 DIAGNOSIS — R221 Localized swelling, mass and lump, neck: Secondary | ICD-10-CM | POA: Insufficient documentation

## 2019-07-21 DIAGNOSIS — Z79899 Other long term (current) drug therapy: Secondary | ICD-10-CM | POA: Insufficient documentation

## 2019-07-21 DIAGNOSIS — N3289 Other specified disorders of bladder: Secondary | ICD-10-CM

## 2019-07-21 DIAGNOSIS — C786 Secondary malignant neoplasm of retroperitoneum and peritoneum: Secondary | ICD-10-CM | POA: Diagnosis not present

## 2019-07-21 DIAGNOSIS — K219 Gastro-esophageal reflux disease without esophagitis: Secondary | ICD-10-CM | POA: Insufficient documentation

## 2019-07-21 DIAGNOSIS — Z87891 Personal history of nicotine dependence: Secondary | ICD-10-CM | POA: Insufficient documentation

## 2019-07-21 DIAGNOSIS — Z95828 Presence of other vascular implants and grafts: Secondary | ICD-10-CM

## 2019-07-21 DIAGNOSIS — C679 Malignant neoplasm of bladder, unspecified: Secondary | ICD-10-CM | POA: Diagnosis present

## 2019-07-21 DIAGNOSIS — N131 Hydronephrosis with ureteral stricture, not elsewhere classified: Secondary | ICD-10-CM | POA: Diagnosis not present

## 2019-07-21 DIAGNOSIS — Z7189 Other specified counseling: Secondary | ICD-10-CM | POA: Insufficient documentation

## 2019-07-21 DIAGNOSIS — Z936 Other artificial openings of urinary tract status: Secondary | ICD-10-CM | POA: Diagnosis not present

## 2019-07-21 HISTORY — DX: Malignant neoplasm of bladder, unspecified: C67.9

## 2019-07-21 HISTORY — PX: PORTACATH PLACEMENT: SHX2246

## 2019-07-21 SURGERY — INSERTION, TUNNELED CENTRAL VENOUS DEVICE, WITH PORT
Anesthesia: General | Site: Chest | Laterality: Left

## 2019-07-21 MED ORDER — LIDOCAINE HCL (PF) 1 % IJ SOLN
INTRAMUSCULAR | Status: DC | PRN
  Administered 2019-07-21: 10 mL

## 2019-07-21 MED ORDER — CEFAZOLIN SODIUM-DEXTROSE 2-4 GM/100ML-% IV SOLN
INTRAVENOUS | Status: AC
  Filled 2019-07-21: qty 100

## 2019-07-21 MED ORDER — BUPIVACAINE HCL (PF) 0.5 % IJ SOLN
INTRAMUSCULAR | Status: DC | PRN
  Administered 2019-07-21: 10 mL

## 2019-07-21 MED ORDER — DEXAMETHASONE SODIUM PHOSPHATE 10 MG/ML IJ SOLN
INTRAMUSCULAR | Status: DC | PRN
  Administered 2019-07-21: 10 mg via INTRAVENOUS

## 2019-07-21 MED ORDER — CHLORHEXIDINE GLUCONATE CLOTH 2 % EX PADS: 6.0000 | MEDICATED_PAD | Freq: Once | CUTANEOUS | Status: DC

## 2019-07-21 MED ORDER — LIDOCAINE HCL (PF) 1 % IJ SOLN
INTRAMUSCULAR | Status: AC
  Filled 2019-07-21: qty 30

## 2019-07-21 MED ORDER — GABAPENTIN 300 MG PO CAPS
300.0000 mg | ORAL_CAPSULE | ORAL | Status: AC
  Administered 2019-07-21: 13:00:00 300 mg via ORAL

## 2019-07-21 MED ORDER — GLYCOPYRROLATE 0.2 MG/ML IJ SOLN
INTRAMUSCULAR | Status: DC | PRN
  Administered 2019-07-21: 0.2 mg via INTRAVENOUS

## 2019-07-21 MED ORDER — FAMOTIDINE 20 MG PO TABS
20.0000 mg | ORAL_TABLET | Freq: Once | ORAL | Status: AC
  Administered 2019-07-21: 13:00:00 20 mg via ORAL

## 2019-07-21 MED ORDER — FAMOTIDINE 20 MG PO TABS
ORAL_TABLET | ORAL | Status: AC
  Administered 2019-07-21: 13:00:00 20 mg via ORAL
  Filled 2019-07-21: qty 1

## 2019-07-21 MED ORDER — ACETAMINOPHEN 500 MG PO TABS
1000.0000 mg | ORAL_TABLET | ORAL | Status: AC
  Administered 2019-07-21: 13:00:00 1000 mg via ORAL

## 2019-07-21 MED ORDER — LACTATED RINGERS IV SOLN
INTRAVENOUS | Status: DC
  Administered 2019-07-21: 13:00:00 via INTRAVENOUS

## 2019-07-21 MED ORDER — LIDOCAINE HCL (PF) 2 % IJ SOLN
INTRAMUSCULAR | Status: AC
  Filled 2019-07-21: qty 10

## 2019-07-21 MED ORDER — SODIUM CHLORIDE (PF) 0.9 % IJ SOLN
INTRAMUSCULAR | Status: AC
  Filled 2019-07-21: qty 50

## 2019-07-21 MED ORDER — ACETAMINOPHEN 500 MG PO TABS
ORAL_TABLET | ORAL | Status: AC
  Administered 2019-07-21: 13:00:00 1000 mg via ORAL
  Filled 2019-07-21: qty 2

## 2019-07-21 MED ORDER — SODIUM CHLORIDE 0.9 % IV SOLN
INTRAVENOUS | Status: DC | PRN
  Administered 2019-07-21: 16:00:00 8 mL via INTRAMUSCULAR

## 2019-07-21 MED ORDER — OXYCODONE HCL 5 MG PO TABS: 5.0000 mg | ORAL_TABLET | ORAL | 0 refills | Status: DC | PRN

## 2019-07-21 MED ORDER — PROPOFOL 10 MG/ML IV BOLUS
INTRAVENOUS | Status: DC | PRN
  Administered 2019-07-21: 100 mg via INTRAVENOUS

## 2019-07-21 MED ORDER — MIDAZOLAM HCL 2 MG/2ML IJ SOLN
INTRAMUSCULAR | Status: AC
  Filled 2019-07-21: qty 2

## 2019-07-21 MED ORDER — ONDANSETRON HCL 4 MG/2ML IJ SOLN
INTRAMUSCULAR | Status: DC | PRN
  Administered 2019-07-21: 4 mg via INTRAVENOUS

## 2019-07-21 MED ORDER — FENTANYL CITRATE (PF) 100 MCG/2ML IJ SOLN
INTRAMUSCULAR | Status: DC | PRN
  Administered 2019-07-21 (×4): 25 ug via INTRAVENOUS

## 2019-07-21 MED ORDER — PROPOFOL 10 MG/ML IV BOLUS
INTRAVENOUS | Status: AC
  Filled 2019-07-21: qty 20

## 2019-07-21 MED ORDER — FENTANYL CITRATE (PF) 100 MCG/2ML IJ SOLN
INTRAMUSCULAR | Status: AC
  Filled 2019-07-21: qty 2

## 2019-07-21 MED ORDER — FENTANYL CITRATE (PF) 100 MCG/2ML IJ SOLN: 25.0000 ug | INTRAMUSCULAR | Status: DC | PRN

## 2019-07-21 MED ORDER — GABAPENTIN 300 MG PO CAPS
ORAL_CAPSULE | ORAL | Status: AC
  Administered 2019-07-21: 300 mg via ORAL
  Filled 2019-07-21: qty 1

## 2019-07-21 MED ORDER — HEPARIN SODIUM (PORCINE) 5000 UNIT/ML IJ SOLN
INTRAMUSCULAR | Status: AC
  Filled 2019-07-21: qty 1

## 2019-07-21 MED ORDER — BUPIVACAINE HCL (PF) 0.5 % IJ SOLN
INTRAMUSCULAR | Status: AC
  Filled 2019-07-21: qty 30

## 2019-07-21 MED ORDER — MIDAZOLAM HCL 2 MG/2ML IJ SOLN
INTRAMUSCULAR | Status: DC | PRN
  Administered 2019-07-21: 2 mg via INTRAVENOUS

## 2019-07-21 MED ORDER — LIDOCAINE HCL (CARDIAC) PF 100 MG/5ML IV SOSY
PREFILLED_SYRINGE | INTRAVENOUS | Status: DC | PRN
  Administered 2019-07-21: 100 mg via INTRAVENOUS

## 2019-07-21 SURGICAL SUPPLY — 34 items
BLADE SURG SZ11 CARB STEEL (BLADE) ×3 IMPLANT
CANISTER SUCT 1200ML W/VALVE (MISCELLANEOUS) ×3 IMPLANT
CHLORAPREP W/TINT 26 (MISCELLANEOUS) ×3 IMPLANT
COVER LIGHT HANDLE STERIS (MISCELLANEOUS) ×6 IMPLANT
COVER WAND RF STERILE (DRAPES) ×3 IMPLANT
DECANTER SPIKE VIAL GLASS SM (MISCELLANEOUS) ×9 IMPLANT
DERMABOND ADVANCED (GAUZE/BANDAGES/DRESSINGS) ×2
DERMABOND ADVANCED .7 DNX12 (GAUZE/BANDAGES/DRESSINGS) ×1 IMPLANT
DRAPE C-ARM XRAY 36X54 (DRAPES) ×3 IMPLANT
ELECT REM PT RETURN 9FT ADLT (ELECTROSURGICAL) ×3
ELECTRODE REM PT RTRN 9FT ADLT (ELECTROSURGICAL) ×1 IMPLANT
GLOVE SURG SYN 7.0 (GLOVE) ×6 IMPLANT
GLOVE SURG SYN 7.0 PF PI (GLOVE) ×1 IMPLANT
GLOVE SURG SYN 7.5  E (GLOVE) ×4
GLOVE SURG SYN 7.5 E (GLOVE) ×2 IMPLANT
GLOVE SURG SYN 7.5 PF PI (GLOVE) ×1 IMPLANT
GOWN STRL REUS W/ TWL LRG LVL3 (GOWN DISPOSABLE) ×2 IMPLANT
GOWN STRL REUS W/TWL LRG LVL3 (GOWN DISPOSABLE) ×6
KIT PORT POWER 8FR ISP CVUE (Port) ×3 IMPLANT
KIT TURNOVER KIT A (KITS) ×3 IMPLANT
LABEL OR SOLS (LABEL) ×3 IMPLANT
NDL FILTER BLUNT 18X1 1/2 (NEEDLE) ×1 IMPLANT
NEEDLE FILTER BLUNT 18X 1/2SAF (NEEDLE) ×2
NEEDLE FILTER BLUNT 18X1 1/2 (NEEDLE) ×1 IMPLANT
NEEDLE HYPO 22GX1.5 SAFETY (NEEDLE) ×3 IMPLANT
NS IRRIG 500ML POUR BTL (IV SOLUTION) ×3 IMPLANT
PACK PORT-A-CATH (MISCELLANEOUS) ×3 IMPLANT
SUT MNCRL AB 4-0 PS2 18 (SUTURE) ×3 IMPLANT
SUT PROLENE 3 0 SH DA (SUTURE) ×3 IMPLANT
SUT VIC AB 3-0 SH 27 (SUTURE) ×2
SUT VIC AB 3-0 SH 27X BRD (SUTURE) ×1 IMPLANT
SYR 10ML LL (SYRINGE) ×3 IMPLANT
SYR 3ML LL SCALE MARK (SYRINGE) ×3 IMPLANT
SYR BULB IRRIG 60ML STRL (SYRINGE) ×2 IMPLANT

## 2019-07-21 NOTE — Op Note (Signed)
  Procedure Date:  07/21/2019  Pre-operative Diagnosis:  Bladder cancer  Post-operative Diagnosis:  Bladder cancer  Procedure:  Left subclavian port-a-cath placement  Surgeon:  Melvyn Neth, MD  Assistant:  Deno Etienne, PA-S  Anesthesia:  General endotracheal  Estimated Blood Loss:  10 ml  Specimens:  None  Complications:  None  Indications for Procedure:  This is a 68 y.o. male who requires a port-a-cath for chemotherapy.  The risks of bleeding, infection, injury to surrounding structures, thrombosis, nonfunction, pneumothorax, hemothorax, and need for further procedures were discussed with the patient and was willing to proceed.  Description of Procedure: The patient was correctly identified in the preoperative area and brought into the operating room.  The patient was placed supine with VTE prophylaxis in place.  Appropriate time-outs were performed.  Anesthesia was induced and the patient was intubated.  Appropriate antibiotics were infused.  The left chest and neck were prepped and draped in usual sterile fashion. The patient was placed in Trendelenburg position and local anesthetic was infiltrated into the skin and subcutaneous tissues in the anterior chest wall. The large bore needle was placed into the subclavian vein without difficulty and then the Seldinger wire was advanced. Fluoroscopy was utilized to confirm that the Seldinger wire was in the superior vena cava.  An incision was made and a port pocket developed with blunt dissection and electrocautery. The introducer dilator was placed over the Seldinger wire and the wire was removed. The previously flushed catheter was placed into the introducer dilator and the peel-away sheath was removed. The catheter length was confirmed and trimmed utilizing fluoroscopy for proper positioning. The catheter was then attached to the previously flushed port. The port was placed into the pocket. The port was secured in place with 3-0  Prolenes and flushed for function and heparin locked.  The wound was closed with interrupted 3-0 Vicryl followed by 4-0 subcuticular Monocryl sutures and sealed with DermaBond.  The patient was emerged from anesthesia and extubated and brought to the recovery room for further management.  A chest x-ray was ordered.  The patient tolerated the procedure well and all counts were correct at the end of the case.   Melvyn Neth, MD

## 2019-07-21 NOTE — Discharge Instructions (Signed)

## 2019-07-21 NOTE — Progress Notes (Signed)
START ON PATHWAY REGIMEN - Bladder     A cycle is every 21 days:     Carboplatin      Gemcitabine   **Always confirm dose/schedule in your pharmacy ordering system**  Patient Characteristics: Advanced/Metastatic Disease, First Line, No Prior Platinum-Based Therapy, Poor Renal Function (CrCl < 50 mL/min), Unknown PD-L1 Expression Therapeutic Status: Advanced/Metastatic Disease Line of Therapy: First Line Prior Platinum-Based Therapy<= No Renal Function: Poor Renal Function (CrCl < 50 mL/min) PD-L1 Expression Status: Unknown PD-L1 Expression Intent of Therapy: Non-Curative / Palliative Intent, Discussed with Patient 

## 2019-07-21 NOTE — Anesthesia Procedure Notes (Signed)
Procedure Name: LMA Insertion Date/Time: 07/21/2019 3:21 PM Performed by: Caryl Asp, CRNA Pre-anesthesia Checklist: Patient identified, Patient being monitored, Timeout performed, Emergency Drugs available and Suction available Patient Re-evaluated:Patient Re-evaluated prior to induction Oxygen Delivery Method: Circle system utilized Preoxygenation: Pre-oxygenation with 100% oxygen Induction Type: IV induction Ventilation: Mask ventilation without difficulty LMA: LMA inserted LMA Size: 4.0 Tube type: Oral Number of attempts: 1 Placement Confirmation: positive ETCO2 and breath sounds checked- equal and bilateral Tube secured with: Tape Dental Injury: Teeth and Oropharynx as per pre-operative assessment

## 2019-07-21 NOTE — Anesthesia Preprocedure Evaluation (Signed)
Anesthesia Evaluation  Patient identified by MRN, date of birth, ID band Patient awake    Reviewed: Allergy & Precautions, H&P , NPO status , Patient's Chart, lab work & pertinent test results, reviewed documented beta blocker date and time   History of Anesthesia Complications Negative for: history of anesthetic complications  Airway Mallampati: I  TM Distance: >3 FB Neck ROM: full    Dental  (+) Dental Advidsory Given, Edentulous Upper, Edentulous Lower   Pulmonary neg pulmonary ROS, Patient abstained from smoking., former smoker,    Pulmonary exam normal        Cardiovascular Exercise Tolerance: Good hypertension, (-) angina(-) Past MI and (-) Cardiac Stents Normal cardiovascular exam(-) dysrhythmias (-) Valvular Problems/Murmurs     Neuro/Psych negative neurological ROS  negative psych ROS   GI/Hepatic Neg liver ROS, GERD  ,  Endo/Other  negative endocrine ROS  Renal/GU negative Renal ROS  negative genitourinary   Musculoskeletal   Abdominal   Peds  Hematology negative hematology ROS (+)   Anesthesia Other Findings Past Medical History: 07/21/2019: Bladder cancer ([place]) No date: Cancer ([place])     Comment:  Bladder cancer   Reproductive/Obstetrics negative OB ROS                             Anesthesia Physical Anesthesia Plan  ASA: III  Anesthesia Plan: General   Post-op Pain Management:    Induction: Intravenous  PONV Risk Score and Plan: 2 and Ondansetron, Dexamethasone and Treatment may vary due to age or medical condition  Airway Management Planned: LMA  Additional Equipment:   Intra-op Plan:   Post-operative Plan: Extubation in OR  Informed Consent: I have reviewed the patients History and Physical, chart, labs and discussed the procedure including the risks, benefits and alternatives for the proposed anesthesia with the patient or authorized representative who has  indicated his/her understanding and acceptance.     Dental Advisory Given  Plan Discussed with: Anesthesiologist, CRNA and Surgeon  Anesthesia Plan Comments:         Anesthesia Quick Evaluation

## 2019-07-21 NOTE — Interval H&P Note (Signed)
History and Physical Interval Note:  07/21/2019 3:07 PM  Johnny Martin  has presented today for surgery, with the diagnosis of Bladder Cancer.  The various methods of treatment have been discussed with the patient and family. After consideration of risks, benefits and other options for treatment, the patient has consented to  Procedure(s): INSERTION PORT-A-CATH (Left) as a surgical intervention.  The patient's history has been reviewed, patient examined, no change in status, stable for surgery.  I have reviewed the patient's chart and labs.  Questions were answered to the patient's satisfaction.     Jerie Basford

## 2019-07-21 NOTE — Anesthesia Postprocedure Evaluation (Signed)
Anesthesia Post Note  Patient: Johnny Martin  Procedure(s) Performed: INSERTION PORT-A-CATH (Left Chest)  Patient location during evaluation: PACU Anesthesia Type: General Level of consciousness: awake and alert Pain management: pain level controlled Vital Signs Assessment: post-procedure vital signs reviewed and stable Respiratory status: spontaneous breathing, nonlabored ventilation, respiratory function stable and patient connected to nasal cannula oxygen Cardiovascular status: blood pressure returned to baseline and stable Postop Assessment: no apparent nausea or vomiting Anesthetic complications: no     Last Vitals:  Vitals:   07/21/19 1719 07/21/19 1750  BP: 134/69 (!) 133/56  Pulse: 81 90  Resp: 18 18  Temp: 36.4 C   SpO2: 96% 98%    Last Pain:  Vitals:   07/21/19 1750  TempSrc:   PainSc: 0-No pain                 Niasia Lanphear S

## 2019-07-21 NOTE — Transfer of Care (Signed)
Immediate Anesthesia Transfer of Care Note  Patient: Johnny Martin  Procedure(s) Performed: INSERTION PORT-A-CATH (Left Chest)  Patient Location: PACU  Anesthesia Type:General  Level of Consciousness: sedated  Airway & Oxygen Therapy: Patient Spontanous Breathing and Patient connected to face mask oxygen  Post-op Assessment: Report given to RN and Post -op Vital signs reviewed and stable  Post vital signs: Reviewed and stable  Last Vitals:  Vitals Value Taken Time  BP 108/66 07/21/19 1621  Temp    Pulse 78 07/21/19 1623  Resp 14 07/21/19 1623  SpO2 98 % 07/21/19 1623  Vitals shown include unvalidated device data.  Last Pain:  Vitals:   07/21/19 1232  TempSrc: Temporal  PainSc: 0-No pain         Complications: No apparent anesthesia complications

## 2019-07-21 NOTE — Anesthesia Post-op Follow-up Note (Signed)
Anesthesia QCDR form completed.        

## 2019-07-22 ENCOUNTER — Encounter: Payer: Self-pay | Admitting: Surgery

## 2019-07-25 ENCOUNTER — Other Ambulatory Visit: Payer: Self-pay

## 2019-07-25 ENCOUNTER — Ambulatory Visit
Admission: RE | Admit: 2019-07-25 | Discharge: 2019-07-25 | Disposition: A | Discharge status: Home / Self Care | Payer: Medicare Other | Source: Ambulatory Visit | Attending: Oncology | Admitting: Oncology

## 2019-07-25 ENCOUNTER — Telehealth: Payer: Self-pay

## 2019-07-25 DIAGNOSIS — C786 Secondary malignant neoplasm of retroperitoneum and peritoneum: Secondary | ICD-10-CM

## 2019-07-25 DIAGNOSIS — C801 Malignant (primary) neoplasm, unspecified: Secondary | ICD-10-CM | POA: Insufficient documentation

## 2019-07-25 DIAGNOSIS — C7901 Secondary malignant neoplasm of right kidney and renal pelvis: Secondary | ICD-10-CM | POA: Diagnosis not present

## 2019-07-25 DIAGNOSIS — C7910 Secondary malignant neoplasm of unspecified urinary organs: Secondary | ICD-10-CM

## 2019-07-25 LAB — GLUCOSE, CAPILLARY: Glucose-Capillary: 93 mg/dL (ref 70–99)

## 2019-07-25 MED ORDER — FLUDEOXYGLUCOSE F - 18 (FDG) INJECTION
8.7950 | Freq: Once | INTRAVENOUS | Status: AC | PRN
  Administered 2019-07-25: 8.795 via INTRAVENOUS

## 2019-07-25 NOTE — Telephone Encounter (Signed)
PET has been completed. Voicemail left with special scheduling to arrange biopsy of left neck mass. Dr. Tasia Catchings updated. I will send Omniseq at the request of Dr. Tasia Catchings on original biopsy.

## 2019-07-26 ENCOUNTER — Telehealth: Payer: Self-pay

## 2019-07-26 ENCOUNTER — Inpatient Hospital Stay: Payer: Medicare Other

## 2019-07-26 ENCOUNTER — Other Ambulatory Visit: Payer: Self-pay

## 2019-07-26 ENCOUNTER — Inpatient Hospital Stay (HOSPITAL_BASED_OUTPATIENT_CLINIC_OR_DEPARTMENT_OTHER): Payer: Medicare Other | Admitting: Oncology

## 2019-07-26 ENCOUNTER — Inpatient Hospital Stay (HOSPITAL_BASED_OUTPATIENT_CLINIC_OR_DEPARTMENT_OTHER): Payer: Medicare Other | Admitting: Hospice and Palliative Medicine

## 2019-07-26 ENCOUNTER — Encounter: Payer: Self-pay | Admitting: Oncology

## 2019-07-26 VITALS — BP 159/88 | HR 93 | Temp 96.7°F | Resp 18 | Ht 73.0 in | Wt 154.7 lb

## 2019-07-26 DIAGNOSIS — Z936 Other artificial openings of urinary tract status: Secondary | ICD-10-CM | POA: Insufficient documentation

## 2019-07-26 DIAGNOSIS — Z515 Encounter for palliative care: Secondary | ICD-10-CM | POA: Insufficient documentation

## 2019-07-26 DIAGNOSIS — R221 Localized swelling, mass and lump, neck: Secondary | ICD-10-CM | POA: Diagnosis not present

## 2019-07-26 DIAGNOSIS — Z7189 Other specified counseling: Secondary | ICD-10-CM | POA: Diagnosis not present

## 2019-07-26 DIAGNOSIS — C679 Malignant neoplasm of bladder, unspecified: Secondary | ICD-10-CM | POA: Insufficient documentation

## 2019-07-26 DIAGNOSIS — R634 Abnormal weight loss: Secondary | ICD-10-CM | POA: Insufficient documentation

## 2019-07-26 DIAGNOSIS — R918 Other nonspecific abnormal finding of lung field: Secondary | ICD-10-CM | POA: Insufficient documentation

## 2019-07-26 DIAGNOSIS — C787 Secondary malignant neoplasm of liver and intrahepatic bile duct: Secondary | ICD-10-CM | POA: Insufficient documentation

## 2019-07-26 DIAGNOSIS — C786 Secondary malignant neoplasm of retroperitoneum and peritoneum: Secondary | ICD-10-CM | POA: Insufficient documentation

## 2019-07-26 DIAGNOSIS — Z66 Do not resuscitate: Secondary | ICD-10-CM | POA: Diagnosis not present

## 2019-07-26 DIAGNOSIS — Z79899 Other long term (current) drug therapy: Secondary | ICD-10-CM | POA: Diagnosis not present

## 2019-07-26 DIAGNOSIS — J439 Emphysema, unspecified: Secondary | ICD-10-CM | POA: Diagnosis not present

## 2019-07-26 DIAGNOSIS — Z87891 Personal history of nicotine dependence: Secondary | ICD-10-CM | POA: Insufficient documentation

## 2019-07-26 DIAGNOSIS — G893 Neoplasm related pain (acute) (chronic): Secondary | ICD-10-CM

## 2019-07-26 DIAGNOSIS — K219 Gastro-esophageal reflux disease without esophagitis: Secondary | ICD-10-CM | POA: Diagnosis not present

## 2019-07-26 DIAGNOSIS — C801 Malignant (primary) neoplasm, unspecified: Secondary | ICD-10-CM

## 2019-07-26 DIAGNOSIS — C7910 Secondary malignant neoplasm of unspecified urinary organs: Secondary | ICD-10-CM

## 2019-07-26 LAB — CBC WITH DIFFERENTIAL/PLATELET
Abs Immature Granulocytes: 0.04 10*3/uL (ref 0.00–0.07)
Basophils Absolute: 0 10*3/uL (ref 0.0–0.1)
Basophils Relative: 1 %
Eosinophils Absolute: 0.1 10*3/uL (ref 0.0–0.5)
Eosinophils Relative: 2 %
HCT: 40.2 % (ref 39.0–52.0)
Hemoglobin: 13.4 g/dL (ref 13.0–17.0)
Immature Granulocytes: 1 %
Lymphocytes Relative: 15 %
Lymphs Abs: 1.1 10*3/uL (ref 0.7–4.0)
MCH: 29.8 pg (ref 26.0–34.0)
MCHC: 33.3 g/dL (ref 30.0–36.0)
MCV: 89.5 fL (ref 80.0–100.0)
Monocytes Absolute: 0.7 10*3/uL (ref 0.1–1.0)
Monocytes Relative: 10 %
Neutro Abs: 5.4 10*3/uL (ref 1.7–7.7)
Neutrophils Relative %: 71 %
Platelets: 183 10*3/uL (ref 150–400)
RBC: 4.49 MIL/uL (ref 4.22–5.81)
RDW: 13.9 % (ref 11.5–15.5)
WBC: 7.4 10*3/uL (ref 4.0–10.5)
nRBC: 0 % (ref 0.0–0.2)

## 2019-07-26 LAB — COMPREHENSIVE METABOLIC PANEL
ALT: 13 U/L (ref 0–44)
AST: 25 U/L (ref 15–41)
Albumin: 3.2 g/dL — ABNORMAL LOW (ref 3.5–5.0)
Alkaline Phosphatase: 127 U/L — ABNORMAL HIGH (ref 38–126)
Anion gap: 11 (ref 5–15)
BUN: 21 mg/dL (ref 8–23)
CO2: 25 mmol/L (ref 22–32)
Calcium: 8.6 mg/dL — ABNORMAL LOW (ref 8.9–10.3)
Chloride: 99 mmol/L (ref 98–111)
Creatinine, Ser: 0.98 mg/dL (ref 0.61–1.24)
GFR calc Af Amer: >60 mL/min (ref >60)
GFR calc non Af Amer: >60 mL/min (ref >60)
Glucose, Bld: 109 mg/dL — ABNORMAL HIGH (ref 70–99)
Potassium: 3.9 mmol/L (ref 3.5–5.1)
Sodium: 135 mmol/L (ref 135–145)
Total Bilirubin: 0.7 mg/dL (ref 0.3–1.2)
Total Protein: 6.7 g/dL (ref 6.5–8.1)

## 2019-07-26 MED ORDER — ONDANSETRON HCL 8 MG PO TABS: 8.0000 mg | ORAL_TABLET | Freq: Two times a day (BID) | ORAL | 1 refills | Status: DC | PRN

## 2019-07-26 MED ORDER — LIDOCAINE-PRILOCAINE 2.5-2.5 % EX CREA: TOPICAL_CREAM | CUTANEOUS | 3 refills | Status: DC

## 2019-07-26 MED ORDER — DEXAMETHASONE 4 MG PO TABS: 8.0000 mg | ORAL_TABLET | Freq: Every day | ORAL | 1 refills | Status: DC

## 2019-07-26 MED ORDER — PROCHLORPERAZINE MALEATE 10 MG PO TABS: 10.0000 mg | ORAL_TABLET | Freq: Four times a day (QID) | ORAL | 1 refills | Status: AC | PRN

## 2019-07-26 NOTE — Progress Notes (Signed)
Patient here to establish care. Patient has port to left upper chest. No other concerns voiced.

## 2019-07-26 NOTE — Progress Notes (Signed)
[place]  [phone] [phone]   Name: Johnny Martin Date: 07/26/2019 MRN: [id number]  DOB: [birth year]  Patient Care Team: Earlie Server, MD as PCP - General (Oncology)    REASON FOR CONSULTATION: Palliative Care consult requested for this 68 y.o. male with multiple medical problems including history of bladder cancer lost to follow-up, who was recently hospitalized 07/11/2019-07/14/2019 with hematuria.  Work-up revealed right-sided hydronephrosis requiring a right nephrostomy tube.  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  Subsequent PET scan on 07/25/2019 shows metastatic disease to bladder, with widespread lymph involvement, bilateral pulmonary nodules, liver, and extensive hypermetabolic peritoneal disease.  He also was noted to have a left neck mass concerning for malignancy.  Patient was referred to palliative care to help address goals and manage ongoing symptoms.  SOCIAL HISTORY:     reports that he quit smoking 13 days ago. He smoked 0.50 packs per day. He has never used smokeless tobacco. He reports previous alcohol use. He reports that he does not use drugs.   Patient is unmarried.  He lives at home with his daughter and son-in-law.  He has another daughter who lives nearby.  Patient worked in [employer].  ADVANCE DIRECTIVES:  Does not have  CODE STATUS: DNR/DNI (MOST Form completed on 07/26/2019)  PAST MEDICAL HISTORY: Past Medical History:  Diagnosis Date   Bladder cancer ([place]) 07/21/2019   Cancer ([place])    Bladder cancer    PAST SURGICAL HISTORY:  Past Surgical History:  Procedure Laterality Date   BLADDER SURGERY     BRAIN SURGERY     plate in head per patient since childhood    PORTACATH PLACEMENT Left 07/21/2019   Procedure: INSERTION PORT-A-CATH;  Surgeon: Olean Ree, MD;  Location: [facility] ORS;  Service: General;  Laterality: Left;     HEMATOLOGY/ONCOLOGY HISTORY:  Oncology History  Bladder cancer ([place])  07/21/2019 Initial Diagnosis   Bladder cancer ([place])   07/27/2019 - 07/27/2019 Chemotherapy   The patient had PALONOSETRON HCL INJECTION 0.25 MG/5ML, 0.25 mg, Intravenous,  Once, 0 of 4 cycles CARBOplatin (PARAPLATIN) in sodium chloride 0.9 % 100 mL chemo infusion, , Intravenous,  Once, 0 of 4 cycles gemcitabine (GEMZAR) 1,900 mg in sodium chloride 0.9 % 250 mL chemo infusion, 1,000 mg/m2, Intravenous,  Once, 0 of 4 cycles FOSAPREPITANT 150MG + DEXAMETHASONE INFUSION [facility], , Intravenous,  Once, 0 of 4 cycles  for chemotherapy treatment.    08/02/2019 -  Chemotherapy   The patient had PALONOSETRON HCL INJECTION 0.25 MG/5ML, 0.25 mg, Intravenous,  Once, 0 of 4 cycles CISplatin (PLATINOL) 133 mg in sodium chloride 0.9 % 500 mL chemo infusion, 70 mg/m2, Intravenous,  Once, 0 of 4 cycles gemcitabine (GEMZAR) 1,900 mg in sodium chloride 0.9 % 250 mL chemo infusion, 1,000 mg/m2, Intravenous,  Once, 0 of 4 cycles FOSAPREPITANT 150MG + DEXAMETHASONE INFUSION [facility], , Intravenous,  Once, 0 of 4 cycles  for chemotherapy treatment.      ALLERGIES:  has No Known Allergies.  MEDICATIONS:  Current Outpatient Medications  Medication Sig Dispense Refill   acetaminophen (TYLENOL) 325 MG tablet Take 325 mg by mouth every 4 (four) hours as needed for headache.     dexamethasone (DECADRON) 4 MG tablet Take 2 tablets (8 mg total) by mouth daily. Start the day after carboplatin chemotherapy for 3 days. 30 tablet 1   lidocaine-prilocaine (EMLA) cream Apply to affected area once 30 g 3  metoprolol tartrate (LOPRESSOR) 25 MG tablet Take 1 tablet (25 mg total) by mouth 2 (two) times daily. 60 tablet 0   nicotine (NICODERM CQ - DOSED IN MG/24 HOURS) 21 mg/24hr patch One patch transdermal chest wall daily (okay to substitute generic patch) (Patient taking differently: Place 21 mg onto the skin daily. ) 28 patch 0   ondansetron  (ZOFRAN) 8 MG tablet Take 1 tablet (8 mg total) by mouth 2 (two) times daily as needed for refractory nausea / vomiting. Start on day 3 after carboplatin chemo. 30 tablet 1   oxyCODONE (ROXICODONE) 5 MG immediate release tablet Take 1 tablet (5 mg total) by mouth every 4 (four) hours as needed for severe pain. 30 tablet 0   pantoprazole (PROTONIX) 40 MG tablet Take 1 tablet (40 mg total) by mouth daily. 30 tablet 0   polyethylene glycol (MIRALAX / GLYCOLAX) 17 g packet Take 17 g by mouth daily as needed for moderate constipation or severe constipation. (Patient taking differently: Take 17 g by mouth daily as needed for moderate constipation or severe constipation (constipation.). ) 14 each 0   prochlorperazine (COMPAZINE) 10 MG tablet Take 1 tablet (10 mg total) by mouth every 6 (six) hours as needed (Nausea or vomiting). 30 tablet 1   senna-docusate (SENOKOT-S) 8.6-50 MG tablet Take 2 tablets by mouth daily. 60 tablet 0   sodium chloride flush (NS) 0.9 % SOLN 5 mLs by Intracatheter route every 8 (eight) hours. 100 Syringe 3   No current facility-administered medications for this visit.     VITAL SIGNS: There were no vitals taken for this visit. There were no vitals filed for this visit.  Estimated body mass index is 20.41 kg/m as calculated from the following:   Height as of an earlier encounter on 07/26/19: _0  (1.854 m).   Weight as of an earlier encounter on 07/26/19: 154 lb 11.2 oz (70.2 kg).  LABS: CBC:    Component Value Date/Time   WBC 7.4 07/26/2019 1011   HGB 13.4 07/26/2019 1011   HGB 8.7 (L) 03/02/2014 0948   HCT 40.2 07/26/2019 1011   HCT 26.2 (L) 03/02/2014 0948   PLT 183 07/26/2019 1011   PLT 136 (L) 03/02/2014 0948   MCV 89.5 07/26/2019 1011   MCV 88 03/02/2014 0948   NEUTROABS 5.4 07/26/2019 1011   NEUTROABS 12.4 (H) 03/02/2014 0948   LYMPHSABS 1.1 07/26/2019 1011   LYMPHSABS 1.4 03/02/2014 0948   MONOABS 0.7 07/26/2019 1011   MONOABS 0.5 03/02/2014 0948    EOSABS 0.1 07/26/2019 1011   EOSABS 0.3 03/02/2014 0948   BASOSABS 0.0 07/26/2019 1011   BASOSABS 0.0 03/02/2014 0948   Comprehensive Metabolic Panel:    Component Value Date/Time   NA 135 07/26/2019 1011   NA 138 02/26/2014 0307   K 3.9 07/26/2019 1011   K 3.8 02/26/2014 0307   CL 99 07/26/2019 1011   CL 107 02/26/2014 0307   CO2 25 07/26/2019 1011   CO2 27 02/26/2014 0307   BUN 21 07/26/2019 1011   BUN 17 02/26/2014 0307   CREATININE 0.98 07/26/2019 1011   CREATININE 1.08 02/26/2014 0307   GLUCOSE 109 (H) 07/26/2019 1011   GLUCOSE 89 02/26/2014 0307   CALCIUM 8.6 (L) 07/26/2019 1011   CALCIUM 7.4 (L) 02/26/2014 0307   AST 25 07/26/2019 1011   AST 20 02/23/2014 2141   ALT 13 07/26/2019 1011   ALT 18 02/23/2014 2141   ALKPHOS 127 (H) 07/26/2019 1011   ALKPHOS  49 02/23/2014 2141   BILITOT 0.7 07/26/2019 1011   BILITOT 0.3 02/23/2014 2141   PROT 6.7 07/26/2019 1011   PROT 5.1 (L) 02/23/2014 2141   ALBUMIN 3.2 (L) 07/26/2019 1011   ALBUMIN 2.6 (L) 02/23/2014 2141    RADIOGRAPHIC STUDIES: Ct Abdomen Wo Contrast  Result Date: 07/12/2019 CLINICAL DATA:  Right hydronephrosis. EXAM: CT ABDOMEN WITHOUT CONTRAST TECHNIQUE: Multidetector CT imaging of the abdomen was performed following the standard protocol without IV contrast. COMPARISON:  Prior recent CT. FINDINGS: Limited CT was obtained in order to determine access for right percutaneous nephrostomy. Right hydronephrosis again identified. Reference is made to percutaneous nephrostomy report. IMPRESSION: Limited CT was obtained in order to determine access for right percutaneous nephrostomy. Reference is made to percutaneous nephrostomy report. Electronically Signed   By: Marcello Moores  Register   On: 07/12/2019 12:20   Ct Guided Needle Placement  Result Date: 07/13/2019 INDICATION: History of bladder cancer, now with peritoneal masses worrisome for metastatic disease. Please perform CT-guided biopsy for tissue diagnostic purposes.  EXAM: CT GUIDANCE NEEDLE PLACEMENT COMPARISON:  CT abdomen and pelvis - 07/11/2019 MEDICATIONS: None. ANESTHESIA/SEDATION: Fentanyl 100 mcg IV; Versed 2 mg IV Sedation time: 10 minutes; The patient was continuously monitored during the procedure by the interventional radiology nurse under my direct supervision. CONTRAST:  None. COMPLICATIONS: None immediate. PROCEDURE: Informed consent was obtained from the patient following an explanation of the procedure, risks, benefits and alternatives. A time out was performed prior to the initiation of the procedure. The patient was positioned supine on the CT table and a limited CT was performed for procedural planning demonstrating unchanged size and appearance of the infiltrative mass involving the left pericolic gutter with dominant component measuring at least 12.3 x 6.2 cm (image 26, series 2). The procedure was planned. The operative site was prepped and draped in the usual sterile fashion. Appropriate trajectory was confirmed with a 22 gauge spinal needle after the adjacent tissues were anesthetized with 1% Lidocaine with epinephrine. Under intermittent CT guidance, a 17 gauge coaxial needle was advanced into the peripheral aspect of the mass. Appropriate positioning was confirmed and 5 core needle biopsy samples were obtained with an 18 gauge core needle biopsy device. The co-axial needle was removed following the administration of a Gel-Foam slurry and superficial hemostasis was achieved with manual compression. A limited postprocedural CT was negative for hemorrhage or additional complication. A dressing was placed. The patient tolerated the procedure well without immediate postprocedural complication. IMPRESSION: Technically successful CT guided core needle biopsy of infiltrative mass within the left pericolic gutter. Electronically Signed   By: Sandi Mariscal M.D.   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 EXAM: NUCLEAR MEDICINE PET SKULL BASE TO THIGH TECHNIQUE: 8.795 mCi F-18 FDG was injected intravenously. Full-ring PET imaging was performed from the skull base to thigh after the radiotracer. CT data was obtained and used for attenuation correction and anatomic localization. Fasting blood glucose: 93 mg/dl COMPARISON:  None. FINDINGS: Mediastinal blood pool activity: SUV max 1.36 . Liver activity: SUV max NA NECK: Enlarged and hypermetabolic left level 2 lymph nodes. Nodal mass measures 3.4 by 1.9 cm and has an SUV max of 7.06 Incidental CT findings: none CHEST: Hypermetabolic right paratracheal, subcarinal and bilateral hilar lymph nodes are identified. Right paratracheal node measures 1.4 cm and has an SUV max of 10.38. Index subcarinal node measures  1.1 cm and has an SUV max of 8.93. Index left hilar node measures 1.2 cm and has an SUV max of 6.41. Right hilar lymph node measures 1.2 cm and has an SUV max 8.66. Right CP angle lymph node is FDG avid measuring 1.4 cm with SUV max of 7.57. Bilateral FDG avid pulmonary nodules. Right lower lobe lung nodule measures 1 cm and has an SUV max of 1.88. Hypermetabolic subpleural nodule in the lingula measures 1 cm and has an SUV max of 6.6. Incidental CT findings: Advanced changes of emphysema. Aortic atherosclerosis. ABDOMEN/PELVIS: There is a few small foci of FDG uptake above liver background activity without definite CT correlate, equivocal for metastasis. For example, within segment 8 there is an area of increased uptake within SUV max of 2.67. This is compared with background liver activity of 2.15. No abnormal uptake within the pancreas, spleen or adrenal glands. Soft tissue mass involving the bladder measures 9 cm within SUV max of 15.7. Hypermetabolic right retrocrural lymph node measures 1 cm and has an SUV max of 7.13. Multiple hypermetabolic retroperitoneal and  bilateral iliac and inguinal lymph nodes identified. Index left periaortic node measures 1.1 cm within SUV max of 8.01. Index retrocaval lymph node measures 1.3 cm and has an SUV max of 6.25. Left common iliac node measures 1.3 cm and has an SUV max of 1.2. Right common iliac node measures 1.5 cm and has an SUV max of 6.4. Left external iliac node measures 1.8 cm with SUV max of 8.79. Left inguinal node measures 0.7 cm and has an SUV max of 4.87. Left pericolic gutter soft tissue mass measures 12.4 cm and has SUV max of 11.9. Right lateral abdominal mass measures 8 cm and has SUV max of 12.07. Large mass within the ventral abdomen measures 16.6 cm within SUV max of 11.36. Small FDG avid peritoneal implants are identified along the surface of the liver. Incidental CT findings: Right-sided percutaneous nephrostomy tube is in place. No hydronephrosis identified bilaterally. Aortic atherosclerosis. SKELETON: No focal hypermetabolic activity to suggest skeletal metastasis. Incidental CT findings: none IMPRESSION: 1. Large mass within the urinary bladder is identified and is hypermetabolic concerning for primary urothelial neoplasm. 2. Hypermetabolic left cervical, thoracic, abdominal, and pelvic lymph nodes consistent with metastatic adenopathy. 3. Extensive, bulky, hypermetabolic peritoneal disease within the abdomen and pelvis. 4. Bilateral pulmonary nodules concerning for metastatic disease. 5. A few small foci (without CT correlate) of mild FDG uptake are noted in the liver which are equivocal for metastasis. 6. No findings to suggest osseous metastasis. 7. Aortic Atherosclerosis (ICD10-I70.0) and Emphysema (ICD10-J43.9). Electronically Signed   By: Kerby Moors M.D.   On: 07/25/2019 13:30   Dg Chest Port 1 View  Result Date: 07/21/2019 CLINICAL DATA:  Port placement.  Metastatic bladder cancer. EXAM: PORTABLE CHEST 1 VIEW COMPARISON:  Chest x-ray dated Feb 24, 2014. FINDINGS: New left chest wall port catheter  with tip in the mid SVC. The heart size and mediastinal contours are within normal limits. Chronically coarsened interstitial markings with emphysematous changes. Small area of nodularity at the left lung base. No focal consolidation, pleural effusion, or pneumothorax. No acute osseous abnormality. IMPRESSION: 1. New appropriately positioned left chest wall port catheter without complicating feature. 2. Small area of nodularity at the left lung base. Metastatic disease is not excluded. Electronically Signed   By: Titus Dubin M.D.   On: 07/21/2019 19:39   Dg C-arm 1-60 Min-no Report  Result Date: 07/21/2019 Fluoroscopy was utilized by  the requesting physician.  No radiographic interpretation.   Ct Renal Stone Study  Result Date: 07/11/2019 CLINICAL DATA:  RIGHT flank pain and hematuria for 1 week, question stone disease; history of bladder tumor post TURBT in 2015 EXAM: CT ABDOMEN AND PELVIS WITHOUT CONTRAST TECHNIQUE: Multidetector CT imaging of the abdomen and pelvis was performed following the standard protocol without IV contrast. Sagittal and coronal MPR images reconstructed from axial data set. No oral contrast administered for this indication. COMPARISON:  02/23/2014 FINDINGS: Lower chest: Subpleural nodule at lingula 6 mm diameter image 1, new. Lung bases emphysematous. Additional nodule versus scar posteromedial LEFT lung base 5 mm diameter image 7. Additional nodularity at posterior sulcus of RIGHT lower lobe up to 8 mm diameter. 12 mm lingular nodule image 3, subpleural. Hepatobiliary: Gallbladder and liver grossly normal appearance Pancreas: Normal appearance Spleen: Normal appearance Adrenals/Urinary Tract: Adrenal glands and LEFT kidney normal appearance. RIGHT hydronephrosis and hydroureter secondary to distal ureteral obstruction question ureteral tumor versus bladder tumor. Several bladder diverticular noted. Tiny dependent calculus within a LEFT lateral bladder diverticulum. Soft tissue  mass within bladder consistent with neoplasm, 9.6 x 9.2 x 7.0 cm. Stomach/Bowel: Scattered colonic diverticulosis. Stomach unremarkable. Appendix not visualized. No bowel dilatation or evidence of obstruction, see below. Vascular/Lymphatic: Atherosclerotic calcifications aorta and iliac arteries without aneurysm. Reproductive: Enlarged LEFT external iliac lymph node 19 mm short axis image 67. Upper normal sized RIGHT cardiophrenic angle node 9 mm act short axis. 10 mm node anterior to aorta image 23. Additional upper normal sized aortocaval, para-aortic, and pelvic nodes. Other: Extensive abnormal tumor deposits throughout the abdomen consistent with omental caking and peritoneal carcinomatosis. Largest of these masses measures 16.3 x 6.6 cm. Additional LEFT abdominal mass measures 12.7 x 4.9 cm. Tumor mass invading anterior abdominal wall at medial RIGHT rectus abdominus muscle and umbilicus, measuring 4.1 x 2.9 x 4.8 cm. Small amount of free fluid in the anterior pelvis. No free air. Scattered intra-abdominal collaterals in the anterior abdomen. Musculoskeletal: No acute osseous lesions. 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 Pelvic and retroperitoneal adenopathy. 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 Small amount of ascites. Bibasilar pulmonary nodules question metastases. RIGHT hydronephrosis and hydroureter secondary to distal ureteral obstruction likely by tumor. Findings called to Dr. Corky Downs on 07/11/2019 at 1349 hours. Electronically Signed   By: Lavonia Dana M.D.   On: 07/11/2019 13:51   Ct Image Guided Fluid Drain By Catheter  Result Date: 07/12/2019 INDICATION: Right hydronephrosis secondary to bladder mass. EXAM: Right percutaneous nephrostomy. MEDICATIONS: The patient is currently admitted to the hospital. 1 g of Ancef administered.  The antibiotics were administered within an appropriate time frame prior to the initiation of the procedure. ANESTHESIA/SEDATION: Fentanyl 2.0 mcg IV; Versed 25 mg IV Moderate Sedation Time:  Approximately 30 minutes The patient was continuously monitored during the procedure by the interventional radiology nurse under my direct supervision. COMPLICATIONS: None PROCEDURE: Informed written consent was obtained from the patient after a thorough discussion of the procedural risks, benefits and alternatives. All questions were addressed. Maximal Sterile Barrier Technique was utilized including caps, mask, sterile gowns, sterile gloves, sterile drape, hand hygiene and skin antiseptic. A timeout was performed prior to the initiation of the procedure. Following sterile preparation patient and CT of the patient in prone position, local anesthesia was administered 1% lidocaine. This followed by placement 18 gauge needle into a  posterior right renal calyx. This followed by placement of an Amplatz extra stiff wire. This is followed by placement of an 8 French percutaneous drainage catheter. Clear urine obtained. Percutaneous nephrostomy catheter was sutured in place and placed to bag drainage. IMPRESSION: Successful right percutaneous nephrostomy. Electronically Signed   By: Marcello Moores  Register   On: 07/12/2019 12:18    PERFORMANCE STATUS (ECOG) : 1 - Symptomatic but completely ambulatory  Review of Systems Unless otherwise noted, a complete review of systems is negative.  Physical Exam General: NAD, frail appearing, thin Pulmonary: Unlabored Abdomen: soft, nontender, + bowel sounds GU: Urinary catheter Extremities: no edema, no joint deformities Skin: no rashes Neurological: Weakness but otherwise nonfocal  IMPRESSION: I met with patient today in the clinic.  Introduced palliative care services and attempt establish therapeutic rapport.  Patient reports since discharging from the hospital he is doing  reasonably well.  He denies any acute changes or concerns today.    He continues to have persistent suprapubic pain.  Pain is partially relieved with as needed use of oxycodone.  He has tolerated oxycodone well and denies any adverse effects.  We discussed liberalizing dosing and he may take 1 to 2 tablets as needed.  Would consider initiation of a long-acting opioid if needed.  Patient says that his goal is to prolong life if possible but that he recognizes that his disease is incurable.  He says "I know I will die from this cancer."  We discussed CODE STATUS.  Patient says that he does not want to have his life prolonged artificially machines.  He would not want to be resuscitated.  He would prefer natural death.  I completed a MOST form today. The patient outlined their wishes for the following treatment decisions:  Cardiopulmonary Resuscitation: Do Not Attempt Resuscitation (DNR/No CPR)  Medical Interventions: Limited Additional Interventions: Use medical treatment, IV fluids and cardiac monitoring as indicated, DO NOT USE intubation or mechanical ventilation. May consider use of less invasive airway support such as BiPAP or CPAP. Also provide comfort measures. Transfer to the hospital if indicated. Avoid intensive care.   Antibiotics: Antibiotics if indicated  IV Fluids: IV fluids if indicated  Feeding Tube: No feeding tube   Patient says that he would want his daughter, [name], to be his decision maker if necessary.  He does not have ACP documents and declined those today.  Case and plan discussed with Dr. Tasia Catchings.  PLAN: -Continue current scope of treatment -Increase dose of oxycodone to 5-38m Q4H PRN -Consider adding LAO -Prophylactic bowel regimen -DNR/DNI -MOST form completed -RTC in 2 weeks   Patient expressed understanding and was in agreement with this plan. He also understands that He can call the clinic at any time with any questions, concerns, or complaints.     Time Total:  30 minutes  Visit consisted of counseling and education dealing with the complex and emotionally intense issues of symptom management and palliative care in the setting of serious and potentially life-threatening illness.Greater than 50%  of this time was spent counseling and coordinating care related to the above assessment and plan.  Signed by: JAltha Harm PhD, NP-C [phone](Work Cell)

## 2019-07-26 NOTE — Telephone Encounter (Signed)
Request for Omniseq on [phone] collected 07-13-19 sent to pathology with confirmation of receipt.

## 2019-07-26 NOTE — Progress Notes (Signed)
ON PATHWAY REGIMEN - Bladder  No Change  Continue With Treatment as Ordered.     A cycle is every 21 days:     Carboplatin      Gemcitabine   **Always confirm dose/schedule in your pharmacy ordering system**  Patient Characteristics: Advanced/Metastatic Disease, First Line, No Prior Platinum-Based Therapy, Poor Renal Function (CrCl < 50 mL/min), Unknown PD-L1 Expression Therapeutic Status: Advanced/Metastatic Disease Line of Therapy: First Line Prior Platinum-Based Therapy<= No Renal Function: Poor Renal Function (CrCl < 50 mL/min) PD-L1 Expression Status: Unknown PD-L1 Expression Intent of Therapy: Non-Curative / Palliative Intent, Discussed with Patient

## 2019-07-26 NOTE — Progress Notes (Signed)
Hematology/Oncology Follow Up Note [facility]  Telephone:([phone] [phone]  Patient Care Team: Johnny Server, MD as PCP - General (Oncology)   Name of the patient: Johnny Martin  [id number]  December 17, 1950   REASON FOR VISIT Follow up for metastatic bladder cancer  PERTINENT ONCOLOGY HISTORY Johnny Martin is a 68 y.o[place] who has above oncology history reviewed by me today presented for follow up visit for management of bladder cancer.   Patient presented to ER for evaluation of right flank pain, also with intermittent passing blood/blood clots in the urine for the past few weeks. 07/11/2019 CT renal stone study showed large bladder neoplasm approximately 9.6 x 9.2 x 7 cm, bladder diverticula, pelvic and retroperitoneal adenopathy, extensive peritoneal carcinomatosis with significant omental caking/tumor masses in the abdomen with invasion of anterior abdominal wall at umbilicus, small amount of ascites.  Bibasilar pulmonary nodules questionable metastasis.  Right hydronephrosis and hydroureter secondary to distal ureteral obstruction likely by tumor.  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  Patient lost to follow-up.   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  The amount of tissue is limited.  Tissue will be preserved for now for ancillary testing, deferring further immunochemistry,  and to neck mass has been sampled to determine if it is a second process.  INTERVAL HISTORY 68 year old male presented for follow-up of metastatic bladder cancer. #Right percutaneous nephrostomy tube was placed.  Reports tube drains well. #07/25/2019 PET scanShowed large mass within the urinary bladder is identified and is hypermetabolic concerning for primary urothelial neoplasm.  Hypermetabolic left cervical,  thoracic, abdominal, pelvic lymph nodes consistent with metastatic adenopathy.  Extensive bulky, hypermetabolic.  Peritoneal disease within the abdomen and pelvis.  Bilateral pulmonary nodules concerning for metastatic disease.  A few small foci without CT correlate of mild FDG uptake are noted in the liver which are equivocal for metastasis. No findings to suggest osseous metastasis.    #07/21/2019 patient had a Mediport placed by Johnny Martin #Left cervical palpable mass, pending biopsy on 08/04/2019. Today patient reports feeling well.  Denies any pain.  Chronic fatigue.  Appetite is fair.  He has previously lost weight unintentionally prior to the admission. Weight is stable. Denies fever, chills, nausea, vomiting, diarrhea, chest pain, shortness of breath, abdominal pain,  lower extremity swelling.  Daughter Johnny Martin was called during encounter.  Review of Systems  Constitutional: Positive for fatigue. Negative for appetite change, chills, fever and unexpected weight change.  HENT:   Negative for hearing loss and voice change.   Eyes: Negative for eye problems and icterus.  Respiratory: Negative for chest tightness, cough and shortness of breath.   Cardiovascular: Negative for chest pain and leg swelling.  Gastrointestinal: Negative for abdominal distention and abdominal pain.  Endocrine: Negative for hot flashes.  Genitourinary: Negative for difficulty urinating, dysuria and frequency.        Nephrostomy tube draining well.  Musculoskeletal: Negative for arthralgias.  Skin: Negative for itching and rash.  Neurological: Negative for light-headedness and numbness.  Hematological: Negative for adenopathy. Does not bruise/bleed easily.  Psychiatric/Behavioral: Negative for confusion.      No Known Allergies   Past Medical History:  Diagnosis Date   Bladder cancer ([place]) 07/21/2019   Cancer [facility])    Bladder cancer     Past Surgical History:  Procedure Laterality Date   BLADDER  SURGERY     BRAIN SURGERY     plate in head per patient since childhood    PORTACATH PLACEMENT Left 07/21/2019   Procedure: INSERTION PORT-A-CATH;  Surgeon: Johnny Ree, MD;  Location: [facility] ORS;  Service: General;  Laterality: Left;    Social History   Socioeconomic History   Marital status: Divorced    Spouse name: Not on file   Number of children: Not on file   Years of education: Not on file   Highest education level: Not on file  Occupational History   Not on file  Social Needs   Financial resource strain: Not on file   Food insecurity    Worry: Not on file    Inability: Not on file   Transportation needs    Medical: Not on file    Non-medical: Not on file  Tobacco Use   Smoking status: Former Smoker    Packs/day: 0.50    Quit date: 07/13/2019    Years since quitting: 0.0   Smokeless tobacco: Never Used  Substance and Sexual Activity   Alcohol use: Not Currently    Frequency: Never    Comment: quit 3 years ago    Drug use: Never   Sexual activity: Not on file  Lifestyle   Physical activity    Days per week: Not on file    Minutes per session: Not on file   Stress: Not on file  Relationships   Social connections    Talks on phone: Not on file    Gets together: Not on file    Attends religious service: Not on file    Active member of club or organization: Not on file    Attends meetings of clubs or organizations: Not on file    Relationship status: Not on file   Intimate partner violence    Fear of current or ex partner: Not on file    Emotionally abused: Not on file    Physically abused: Not on file    Forced sexual activity: Not on file  Other Topics Concern   Not on file  Social History Narrative   Lives at home with daughter and son in [place].    Family History  Problem Relation Age of Onset   Cervical cancer Sister    Kidney failure Mother    Stroke Mother    Alzheimer's disease Father      Current Outpatient  Medications:    acetaminophen (TYLENOL) 325 MG tablet, Take 325 mg by mouth every 4 (four) hours as needed for headache., Disp: , Rfl:    metoprolol tartrate (LOPRESSOR) 25 MG tablet, Take 1 tablet (25 mg total) by mouth 2 (two) times daily., Disp: 60 tablet, Rfl: 0   nicotine (NICODERM CQ - DOSED IN MG/24 HOURS) 21 mg/24hr patch, One patch transdermal chest wall daily (okay to substitute generic patch) (Patient taking differently: Place 21 mg onto the skin daily. ), Disp: 28 patch, Rfl: 0   oxyCODONE (ROXICODONE) 5 MG immediate release tablet, Take 1 tablet (5 mg total) by mouth every 4 (four) hours as needed for severe pain., Disp: 30 tablet, Rfl: 0   pantoprazole (PROTONIX) 40 MG tablet, Take 1 tablet (40 mg total) by mouth daily., Disp: 30 tablet, Rfl: 0   polyethylene glycol (MIRALAX / GLYCOLAX) 17 g packet, Take 17 g by mouth daily as needed for moderate constipation or severe constipation. (Patient taking differently: Take 17 g by mouth daily as needed for moderate constipation or severe  constipation (constipation.). ), Disp: 14 each, Rfl: 0   senna-docusate (SENOKOT-S) 8.6-50 MG tablet, Take 2 tablets by mouth daily., Disp: 60 tablet, Rfl: 0   sodium chloride flush (NS) 0.9 % SOLN, 5 mLs by Intracatheter route every 8 (eight) hours., Disp: 100 Syringe, Rfl: 3   dexamethasone (DECADRON) 4 MG tablet, Take 2 tablets (8 mg total) by mouth daily. Start the day after carboplatin chemotherapy for 3 days., Disp: 30 tablet, Rfl: 1   lidocaine-prilocaine (EMLA) cream, Apply to affected area once, Disp: 30 g, Rfl: 3   ondansetron (ZOFRAN) 8 MG tablet, Take 1 tablet (8 mg total) by mouth 2 (two) times daily as needed for refractory nausea / vomiting. Start on day 3 after carboplatin chemo., Disp: 30 tablet, Rfl: 1   prochlorperazine (COMPAZINE) 10 MG tablet, Take 1 tablet (10 mg total) by mouth every 6 (six) hours as needed (Nausea or vomiting)., Disp: 30 tablet, Rfl: 1  Physical exam:    Vitals:   07/26/19 1026  BP: (!) 159/88  Pulse: 93  Resp: 18  Temp: (!) 96.7 F (35.9 C)  TempSrc: Tympanic  Weight: 154 lb 11.2 oz (70.2 kg)  Height: 6\' 1"  (1.854 m)   Physical Exam Constitutional:      General: He is not in acute distress.    Appearance: He is ill-appearing.     Comments: Thin, walks in  HENT:     Head: Normocephalic and atraumatic.  Eyes:     General: No scleral icterus.    Pupils: Pupils are equal, round, and reactive to light.  Neck:     Musculoskeletal: Normal range of motion and neck supple.  Cardiovascular:     Rate and Rhythm: Normal rate and regular rhythm.     Heart sounds: Normal heart sounds.  Pulmonary:     Effort: Pulmonary effort is normal. No respiratory distress.     Breath sounds: No wheezing.  Abdominal:     General: Bowel sounds are normal. There is no distension.     Palpations: Abdomen is soft. There is no mass.     Tenderness: There is no abdominal tenderness.     Comments: Right percutaneous nephrostomy tube, yellow urine in the nephrostomy bag  Musculoskeletal: Normal range of motion.        General: No deformity.  Skin:    General: Skin is warm and dry.     Findings: No erythema or rash.  Neurological:     Mental Status: He is alert and oriented to person, place, and time.     Cranial Nerves: No cranial nerve deficit.     Coordination: Coordination normal.  Psychiatric:        Mood and Affect: Mood normal.        Behavior: Behavior normal.     CMP Latest Ref Rng & Units 07/26/2019  Glucose 70 - 99 mg/dL 109(H)  BUN 8 - 23 mg/dL 21  Creatinine 0.61 - 1.24 mg/dL 0.98  Sodium 135 - 145 mmol/L 135  Potassium 3.5 - 5.1 mmol/L 3.9  Chloride 98 - 111 mmol/L 99  CO2 22 - 32 mmol/L 25  Calcium 8.9 - 10.3 mg/dL 8.6(L)  Total Protein 6.5 - 8.1 g/dL 6.7  Total Bilirubin 0.3 - 1.2 mg/dL 0.7  Alkaline Phos 38 - 126 U/L 127(H)  AST 15 - 41 U/L 25  ALT 0 - 44 U/L 13   CBC Latest Ref Rng & Units 07/26/2019  WBC 4.0 - 10.5  K/uL 7.4  Hemoglobin  13.0 - 17.0 g/dL 13.4  Hematocrit 39.0 - 52.0 % 40.2  Platelets 150 - 400 K/uL 183   RADIOGRAPHIC STUDIES: I have personally reviewed the radiological images as listed and agreed with the findings in the report.  Ct Abdomen Wo Contrast  Result Date: 07/12/2019 CLINICAL DATA:  Right hydronephrosis. EXAM: CT ABDOMEN WITHOUT CONTRAST TECHNIQUE: Multidetector CT imaging of the abdomen was performed following the standard protocol without IV contrast. COMPARISON:  Prior recent CT. FINDINGS: Limited CT was obtained in order to determine access for right percutaneous nephrostomy. Right hydronephrosis again identified. Reference is made to percutaneous nephrostomy report. IMPRESSION: Limited CT was obtained in order to determine access for right percutaneous nephrostomy. Reference is made to percutaneous nephrostomy report. Electronically Signed   By: Marcello Moores  Register   On: 07/12/2019 12:20   Ct Guided Needle Placement  Result Date: 07/13/2019 INDICATION: History of bladder cancer, now with peritoneal masses worrisome for metastatic disease. Please perform CT-guided biopsy for tissue diagnostic purposes. EXAM: CT GUIDANCE NEEDLE PLACEMENT COMPARISON:  CT abdomen and pelvis - 07/11/2019 MEDICATIONS: None. ANESTHESIA/SEDATION: Fentanyl 100 mcg IV; Versed 2 mg IV Sedation time: 10 minutes; The patient was continuously monitored during the procedure by the interventional radiology nurse under my direct supervision. CONTRAST:  None. COMPLICATIONS: None immediate. PROCEDURE: Informed consent was obtained from the patient following an explanation of the procedure, risks, benefits and alternatives. A time out was performed prior to the initiation of the procedure. The patient was positioned supine on the CT table and a limited CT was performed for procedural planning demonstrating unchanged size and appearance of the infiltrative mass involving the left pericolic gutter with dominant component  measuring at least 12.3 x 6.2 cm (image 26, series 2). The procedure was planned. The operative site was prepped and draped in the usual sterile fashion. Appropriate trajectory was confirmed with a 22 gauge spinal needle after the adjacent tissues were anesthetized with 1% Lidocaine with epinephrine. Under intermittent CT guidance, a 17 gauge coaxial needle was advanced into the peripheral aspect of the mass. Appropriate positioning was confirmed and 5 core needle biopsy samples were obtained with an 18 gauge core needle biopsy device. The co-axial needle was removed following the administration of a Gel-Foam slurry and superficial hemostasis was achieved with manual compression. A limited postprocedural CT was negative for hemorrhage or additional complication. A dressing was placed. The patient tolerated the procedure well without immediate postprocedural complication. IMPRESSION: Technically successful CT guided core needle biopsy of infiltrative mass within the left pericolic gutter. Electronically Signed   By: Sandi Mariscal M.D.   On: 07/13/2019 11:35   Nm Pet Image Initial (pi) Skull Base To Thigh  Result Date: 07/25/2019 CLINICAL DATA:  Initial treatment strategy for metastatic urothelial carcinoma. EXAM: NUCLEAR MEDICINE PET SKULL BASE TO THIGH TECHNIQUE: 8.795 mCi F-18 FDG was injected intravenously. Full-ring PET imaging was performed from the skull base to thigh after the radiotracer. CT data was obtained and used for attenuation correction and anatomic localization. Fasting blood glucose: 93 mg/dl COMPARISON:  None. FINDINGS: Mediastinal blood pool activity: SUV max 1.36 . Liver activity: SUV max NA NECK: Enlarged and hypermetabolic left level 2 lymph nodes. Nodal mass measures 3.4 by 1.9 cm and has an SUV max of 7.06 Incidental CT findings: none CHEST: Hypermetabolic right paratracheal, subcarinal and bilateral hilar lymph nodes are identified. Right paratracheal node measures 1.4 cm and has an SUV  max of 10.38. Index subcarinal node measures 1.1 cm and has an SUV  max of 8.93. Index left hilar node measures 1.2 cm and has an SUV max of 6.41. Right hilar lymph node measures 1.2 cm and has an SUV max 8.66. Right CP angle lymph node is FDG avid measuring 1.4 cm with SUV max of 7.57. Bilateral FDG avid pulmonary nodules. Right lower lobe lung nodule measures 1 cm and has an SUV max of 1.88. Hypermetabolic subpleural nodule in the lingula measures 1 cm and has an SUV max of 6.6. Incidental CT findings: Advanced changes of emphysema. Aortic atherosclerosis. ABDOMEN/PELVIS: There is a few small foci of FDG uptake above liver background activity without definite CT correlate, equivocal for metastasis. For example, within segment 8 there is an area of increased uptake within SUV max of 2.67. This is compared with background liver activity of 2.15. No abnormal uptake within the pancreas, spleen or adrenal glands. Soft tissue mass involving the bladder measures 9 cm within SUV max of 15.7. Hypermetabolic right retrocrural lymph node measures 1 cm and has an SUV max of 7.13. Multiple hypermetabolic retroperitoneal and bilateral iliac and inguinal lymph nodes identified. Index left periaortic node measures 1.1 cm within SUV max of 8.01. Index retrocaval lymph node measures 1.3 cm and has an SUV max of 6.25. Left common iliac node measures 1.3 cm and has an SUV max of 1.2. Right common iliac node measures 1.5 cm and has an SUV max of 6.4. Left external iliac node measures 1.8 cm with SUV max of 8.79. Left inguinal node measures 0.7 cm and has an SUV max of 4.87. Left pericolic gutter soft tissue mass measures 12.4 cm and has SUV max of 11.9. Right lateral abdominal mass measures 8 cm and has SUV max of 12.07. Large mass within the ventral abdomen measures 16.6 cm within SUV max of 11.36. Small FDG avid peritoneal implants are identified along the surface of the liver. Incidental CT findings: Right-sided percutaneous  nephrostomy tube is in place. No hydronephrosis identified bilaterally. Aortic atherosclerosis. SKELETON: No focal hypermetabolic activity to suggest skeletal metastasis. Incidental CT findings: none IMPRESSION: 1. Large mass within the urinary bladder is identified and is hypermetabolic concerning for primary urothelial neoplasm. 2. Hypermetabolic left cervical, thoracic, abdominal, and pelvic lymph nodes consistent with metastatic adenopathy. 3. Extensive, bulky, hypermetabolic peritoneal disease within the abdomen and pelvis. 4. Bilateral pulmonary nodules concerning for metastatic disease. 5. A few small foci (without CT correlate) of mild FDG uptake are noted in the liver which are equivocal for metastasis. 6. No findings to suggest osseous metastasis. 7. Aortic Atherosclerosis (ICD10-I70.0) and Emphysema (ICD10-J43.9). Electronically Signed   By: Kerby Moors M.D.   On: 07/25/2019 13:30   Dg Chest Port 1 View  Result Date: 07/21/2019 CLINICAL DATA:  Port placement.  Metastatic bladder cancer. EXAM: PORTABLE CHEST 1 VIEW COMPARISON:  Chest x-ray dated Feb 24, 2014. FINDINGS: New left chest wall port catheter with tip in the mid SVC. The heart size and mediastinal contours are within normal limits. Chronically coarsened interstitial markings with emphysematous changes. Small area of nodularity at the left lung base. No focal consolidation, pleural effusion, or pneumothorax. No acute osseous abnormality. IMPRESSION: 1. New appropriately positioned left chest wall port catheter without complicating feature. 2. Small area of nodularity at the left lung base. Metastatic disease is not excluded. Electronically Signed   By: Titus Dubin M.D.   On: 07/21/2019 19:39   Dg C-arm 1-60 Min-no Report  Result Date: 07/21/2019 Fluoroscopy was utilized by the requesting physician.  No radiographic interpretation.  Ct Renal Stone Study  Result Date: 07/11/2019 CLINICAL DATA:  RIGHT flank pain and hematuria for  1 week, question stone disease; history of bladder tumor post TURBT in 2015 EXAM: CT ABDOMEN AND PELVIS WITHOUT CONTRAST TECHNIQUE: Multidetector CT imaging of the abdomen and pelvis was performed following the standard protocol without IV contrast. Sagittal and coronal MPR images reconstructed from axial data set. No oral contrast administered for this indication. COMPARISON:  02/23/2014 FINDINGS: Lower chest: Subpleural nodule at lingula 6 mm diameter image 1, new. Lung bases emphysematous. Additional nodule versus scar posteromedial LEFT lung base 5 mm diameter image 7. Additional nodularity at posterior sulcus of RIGHT lower lobe up to 8 mm diameter. 12 mm lingular nodule image 3, subpleural. Hepatobiliary: Gallbladder and liver grossly normal appearance Pancreas: Normal appearance Spleen: Normal appearance Adrenals/Urinary Tract: Adrenal glands and LEFT kidney normal appearance. RIGHT hydronephrosis and hydroureter secondary to distal ureteral obstruction question ureteral tumor versus bladder tumor. Several bladder diverticular noted. Tiny dependent calculus within a LEFT lateral bladder diverticulum. Soft tissue mass within bladder consistent with neoplasm, 9.6 x 9.2 x 7.0 cm. Stomach/Bowel: Scattered colonic diverticulosis. Stomach unremarkable. Appendix not visualized. No bowel dilatation or evidence of obstruction, see below. Vascular/Lymphatic: Atherosclerotic calcifications aorta and iliac arteries without aneurysm. Reproductive: Enlarged LEFT external iliac lymph node 19 mm short axis image 67. Upper normal sized RIGHT cardiophrenic angle node 9 mm act short axis. 10 mm node anterior to aorta image 23. Additional upper normal sized aortocaval, para-aortic, and pelvic nodes. Other: Extensive abnormal tumor deposits throughout the abdomen consistent with omental caking and peritoneal carcinomatosis. Largest of these masses measures 16.3 x 6.6 cm. Additional LEFT abdominal mass measures 12.7 x 4.9 cm.  Tumor mass invading anterior abdominal wall at medial RIGHT rectus abdominus muscle and umbilicus, measuring 4.1 x 2.9 x 4.8 cm. Small amount of free fluid in the anterior pelvis. No free air. Scattered intra-abdominal collaterals in the anterior abdomen. Musculoskeletal: No acute osseous lesions. 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 Pelvic and retroperitoneal adenopathy. 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 Small amount of ascites. Bibasilar pulmonary nodules question metastases. RIGHT hydronephrosis and hydroureter secondary to distal ureteral obstruction likely by tumor. Findings called to Dr. Corky Downs on 07/11/2019 at 1349 hours. Electronically Signed   By: Lavonia Dana M.D.   On: 07/11/2019 13:51   Ct Image Guided Fluid Drain By Catheter  Result Date: 07/12/2019 INDICATION: Right hydronephrosis secondary to bladder mass. EXAM: Right percutaneous nephrostomy. MEDICATIONS: The patient is currently admitted to the hospital. 1 g of Ancef administered. The antibiotics were administered within an appropriate time frame prior to the initiation of the procedure. ANESTHESIA/SEDATION: Fentanyl 2.0 mcg IV; Versed 25 mg IV Moderate Sedation Time:  Approximately 30 minutes The patient was continuously monitored during the procedure by the interventional radiology nurse under my direct supervision. COMPLICATIONS: None PROCEDURE: Informed written consent was obtained from the patient after a thorough discussion of the procedural risks, benefits and alternatives. All questions were addressed. Maximal Sterile Barrier Technique was utilized including caps, mask, sterile gowns, sterile gloves, sterile drape, hand hygiene and skin antiseptic. A timeout was performed prior to the initiation of the procedure. Following sterile preparation patient and CT of the  patient in prone position, local anesthesia was administered 1% lidocaine. This followed by placement 18 gauge needle into a posterior right renal calyx. This followed by placement  of an Amplatz extra stiff wire. This is followed by placement of an 8 French percutaneous drainage catheter. Clear urine obtained. Percutaneous nephrostomy catheter was sutured in place and placed to bag drainage. IMPRESSION: Successful right percutaneous nephrostomy. Electronically Signed   By: Marcello Moores  Register   On: 07/12/2019 12:18     Assessment and plan Patient is a 68 y.o. male presents for management of metastatic urothelial carcinoma 1. Malignant neoplasm of urinary bladder, unspecified site ([place])   2. Goals of care, counseling/discussion   3. Mass of left side of neck   4. Peritoneal carcinomatosis ([place])   5. Nephrostomy status ([place])    #CT-guided omentum biopsy was reviewed and discussed with patient. PET scan images were reviewed and discussed with patient. Patient's daughter Johnny Martin was called during the encounter and updated. Patient has widely metastatic urothelial carcinoma, stage IV. The diagnosis of stage IV disease and care plan were discussed with patient in detail.  NCCN guidelines were reviewed and shared with patient.   The goal of treatment which is to palliate disease, disease related symptoms, improve quality of life and hopefully prolong life was highlighted in our discussion.  Chemotherapy education was provided.  We had discussed the composition of chemotherapy regimen, length of chemo cycle, duration of treatment and the time to assess response to treatment.    I explained to the patient the risks and benefits of chemotherapy gemcitabine and cisplatin including all but not limited to hair loss, hearing loss, mouth sore, nausea, vomiting, diarrhea, low blood counts, kidney failure, bleeding, neuropathy and risk of life threatening infection and even death, secondary malignancy etc.  . Patient  voices understanding and willing to proceed chemotherapy.  # Chemotherapy education; Medi- port placement. Antiemetics-Zofran and Compazine; EMLA cream sent to pharmacy #Ototoxicity was specifically discussed with patient and I recommend patient to proceed with baseline audiogram.  Patient understands the ototoxicity of chemotherapy and he declines baseline audiogram.  #Left sided neck mass, although possible that this is part of his widely metastatic urothelial carcinoma, given patient's extensive smoking history, will proceed with additional biopsy of the left neck mass to rule out other process, i.e. head and neck cancer. Decision was made to proceed with chemotherapy as soon as possible give the extensive tumor burden.  Patient will proceed with ultrasound-guided biopsy of the neck mass after started on chemotherapy.  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  Awaiting results. #Nephrostomy tube, follow-up with urology.  Supportive care measures are necessary for patient well-being and will be provided as necessary. We spent sufficient time to discuss many aspect of care, questions were answered to patient's satisfaction.   Orders Placed This Encounter  Procedures   Comprehensive metabolic panel    Standing Status:   Standing    Number of Occurrences:   20    Standing Expiration Date:   07/25/2020   CBC with Differential    Standing Status:   Standing    Number of Occurrences:   20    Standing Expiration Date:   07/26/2020   CBC with Differential    Standing Status:   Standing    Number of Occurrences:   20    Standing Expiration Date:   07/26/2020   Comprehensive metabolic panel    Standing Status:   Standing    Number of Occurrences:   20    Standing Expiration Date:   07/26/2020   Ambulatory Referral to Palliative Care    Referral Priority:   Routine  Referral Type:   Consultation    Referred to Provider:   Borders, Kirt Boys, NP    Number of Visits  Requested:   1     We spent sufficient time to discuss many aspect of care, questions were answered to patient's satisfaction. Total face to face encounter time for this patient visit was 40 min. >50% of the time was  spent in counseling and coordination of care.    Johnny Server, MD, PhD Hematology Oncology [facility] Pager- [phone] 07/26/2019

## 2019-07-26 NOTE — Progress Notes (Signed)
DISCONTINUE ON PATHWAY REGIMEN - Bladder     A cycle is every 21 days:     Carboplatin      Gemcitabine   **Always confirm dose/schedule in your pharmacy ordering system**  REASON: Other Reason PRIOR TREATMENT: [id number]: Carboplatin AUC=5 D1 + Gemcitabine 1,000 mg/m2 D1, 8 q21 Days for a Maximum of 6 Cycles TREATMENT RESPONSE: Unable to Evaluate  START ON PATHWAY REGIMEN - Bladder     A cycle is every 21 days:     Gemcitabine      Cisplatin   **Always confirm dose/schedule in your pharmacy ordering system**  Patient Characteristics: Advanced/Metastatic Disease, First Line, No Prior Platinum-Based Therapy, Good Renal Function (CrCl ? 50 mL/min) Therapeutic Status: Advanced/Metastatic Disease Line of Therapy: First Line Prior Platinum-Based Therapy<= No Renal Function: Good Renal Function (CrCl ? 50 mL/min) Intent of Therapy: Non-Curative / Palliative Intent, Discussed with Patient

## 2019-07-27 ENCOUNTER — Telehealth: Payer: Self-pay

## 2019-07-27 ENCOUNTER — Other Ambulatory Visit: Payer: Self-pay | Admitting: *Deleted

## 2019-07-27 MED ORDER — OXYCODONE HCL 5 MG PO TABS: 5.0000 mg | ORAL_TABLET | ORAL | 0 refills | Status: DC | PRN

## 2019-07-27 NOTE — Telephone Encounter (Signed)
I refilled 1-2 tablets Q4H PRN. We can increase to 10mg  tablets at time of next refill if he is consistently using that dose.

## 2019-07-27 NOTE — Telephone Encounter (Signed)
Johnny Martin called asking for a refill on the Oxycodone 5 mg (request entered via refill request).  Johnny Martin is wondering if you think the med needs to be changed since he is requiring the Oxycodone 5mg  2 tabs Q4-5H.

## 2019-07-27 NOTE — Telephone Encounter (Signed)
Kim notified

## 2019-07-27 NOTE — Telephone Encounter (Signed)
Please forward to [place]- he is managing pain medication

## 2019-07-28 ENCOUNTER — Telehealth: Payer: Self-pay | Admitting: *Deleted

## 2019-07-28 NOTE — Telephone Encounter (Signed)
Daughter [name] caled reporting that patient has changed his mind and does not want to take chemotherapy, He wants to know what other options he has. Please return her call [phone]

## 2019-07-29 NOTE — Telephone Encounter (Signed)
Called daughter kim and she will try to discuss with patient again. Keep current appointment as planned for now.

## 2019-07-29 NOTE — Telephone Encounter (Signed)
Daughter [name] has called again today requesting that Dr Tasia Catchings return her call to discuss other options for treatment [phone]

## 2019-08-01 ENCOUNTER — Inpatient Hospital Stay: Payer: Medicare Other | Admitting: Hospice and Palliative Medicine

## 2019-08-01 ENCOUNTER — Inpatient Hospital Stay: Payer: Medicare Other

## 2019-08-02 ENCOUNTER — Other Ambulatory Visit: Payer: Self-pay

## 2019-08-02 ENCOUNTER — Ambulatory Visit (INDEPENDENT_AMBULATORY_CARE_PROVIDER_SITE_OTHER): Payer: Self-pay | Admitting: Physician Assistant

## 2019-08-02 ENCOUNTER — Encounter: Payer: Self-pay | Admitting: Physician Assistant

## 2019-08-02 VITALS — BP 136/84 | HR 96 | Temp 97.0°F | Ht 73.0 in | Wt 153.0 lb

## 2019-08-02 DIAGNOSIS — N3289 Other specified disorders of bladder: Secondary | ICD-10-CM

## 2019-08-02 DIAGNOSIS — Z09 Encounter for follow-up examination after completed treatment for conditions other than malignant neoplasm: Secondary | ICD-10-CM

## 2019-08-02 NOTE — Patient Instructions (Signed)
   Follow-up with our office as needed.  Please call and ask to speak with a nurse if you develop questions or concerns.  

## 2019-08-02 NOTE — Progress Notes (Signed)
[facility] POST-OP OFFICE VISIT  08/02/2019  HPI: Johnny Martin is a 68 y.o. male 12 days s/p port placement by Dr Hampton Abbot  Today, He reports that he is doing well. He has not accessed the port because he is unsure if he is going to do oral of IV chemotherapy. He is set to follow up with Dr Tasia Catchings regarding this. No issues with incision. He would like to leave port in place in case he decides to do IV chemotherapy.   Vital signs: BP 136/84   Pulse 96   Temp (!) 97 F (36.1 C) (Temporal)   Ht 6\' 1"  (1.854 m)   Wt 153 lb (69.4 kg)   SpO2 96%   BMI 20.19 kg/m    Physical Exam: Constitutional: Well appearing male, NAD Skin: Left chest wall incision is CDI, port palpable, no erythema, no drainage   Assessment/Plan: This is a 68 y.o. male 12 days s/p port placement   - Okay to access port, flush pas needed  - F/u prn  - advised to call with questions  -- Edison Simon, PA-C [facility] 08/02/2019, 9:43 AM [phone] M-F: 7am - 4pm

## 2019-08-03 ENCOUNTER — Encounter: Payer: Medicare Other | Admitting: Physician Assistant

## 2019-08-03 ENCOUNTER — Inpatient Hospital Stay: Payer: Medicare Other

## 2019-08-03 ENCOUNTER — Inpatient Hospital Stay: Payer: Medicare Other | Admitting: Oncology

## 2019-08-03 ENCOUNTER — Inpatient Hospital Stay: Payer: Medicare Other | Admitting: Hospice and Palliative Medicine

## 2019-08-04 ENCOUNTER — Inpatient Hospital Stay: Payer: Medicare Other

## 2019-08-04 ENCOUNTER — Ambulatory Visit: Payer: Medicare Other

## 2019-08-04 NOTE — Progress Notes (Signed)
Called patient no answer left message  

## 2019-08-05 ENCOUNTER — Encounter: Payer: Self-pay | Admitting: Oncology

## 2019-08-05 ENCOUNTER — Inpatient Hospital Stay (HOSPITAL_BASED_OUTPATIENT_CLINIC_OR_DEPARTMENT_OTHER): Payer: Medicare Other | Admitting: Oncology

## 2019-08-05 DIAGNOSIS — R221 Localized swelling, mass and lump, neck: Secondary | ICD-10-CM

## 2019-08-05 DIAGNOSIS — G893 Neoplasm related pain (acute) (chronic): Secondary | ICD-10-CM

## 2019-08-05 DIAGNOSIS — R110 Nausea: Secondary | ICD-10-CM

## 2019-08-05 DIAGNOSIS — C679 Malignant neoplasm of bladder, unspecified: Secondary | ICD-10-CM

## 2019-08-05 DIAGNOSIS — Z79899 Other long term (current) drug therapy: Secondary | ICD-10-CM

## 2019-08-05 DIAGNOSIS — Z87891 Personal history of nicotine dependence: Secondary | ICD-10-CM

## 2019-08-05 DIAGNOSIS — Z7189 Other specified counseling: Secondary | ICD-10-CM

## 2019-08-05 DIAGNOSIS — Z936 Other artificial openings of urinary tract status: Secondary | ICD-10-CM

## 2019-08-05 NOTE — Progress Notes (Signed)
Patient verified using two identifiers for virtual visit via telephone today.   Spoke with patient's son-in-law, Ronald Pippins.  Patient has been dealing with nausea/vomiting for the past couple of days and is interested in rx for nausea.  Reports having abdominal pain after he eats.  He has been eating a soft/light diet like jello with no problem.

## 2019-08-06 NOTE — Progress Notes (Signed)
HEMATOLOGY-ONCOLOGY TeleHEALTH VISIT PROGRESS NOTE  I connected with Johnny Martin on 08/06/19 at  2:30 PM EDT by video enabled telemedicine visit and verified that I am speaking with the correct person using two identifiers. I discussed the limitations, risks, security and privacy concerns of performing an evaluation and management service by telemedicine and the availability of in-person appointments. I also discussed with the patient that there may be a patient responsible charge related to this service. The patient expressed understanding and agreed to proceed.   Other persons participating in the visit and their role in the encounter:  None  Patient's location: Home  Provider's location: office Chief Complaint: Follow-up for metastatic bladder cancer.   INTERVAL HISTORY Johnny Martin is a 68 y.o. male who has above history reviewed by me today presents for follow up visit for management of follow-up for bladder cancer Problems and complaints are listed below:  Patient is supposed to start palliative chemotherapy with cisplatin and gemcitabine this week.  Patient changed her mind during interval and declines chemotherapy.  Left neck biopsy was canceled as patient declines chemotherapy.  He reports feeling a little weaker since last visit.  Nausea and vomited once.  Able to tolerate p.o. Otherwise no new complaints.  Denies any pain.  Denies any issues with nephrostomy tube.  Review of Systems  Constitutional: Positive for appetite change, fatigue and unexpected weight change. Negative for chills and fever.  HENT:   Negative for hearing loss and voice change.   Eyes: Negative for eye problems and icterus.  Respiratory: Negative for chest tightness, cough and shortness of breath.   Cardiovascular: Negative for chest pain and leg swelling.  Gastrointestinal: Negative for abdominal distention and abdominal pain.  Endocrine: Negative for hot flashes.  Genitourinary: Negative for  difficulty urinating, dysuria and frequency.   Musculoskeletal: Negative for arthralgias.  Skin: Negative for itching and rash.  Neurological: Negative for light-headedness and numbness.  Hematological: Negative for adenopathy. Does not bruise/bleed easily.  Psychiatric/Behavioral: Negative for confusion.    Past Medical History:  Diagnosis Date  . Bladder cancer ([place]) 07/21/2019  . Cancer [facility])    Bladder cancer   Past Surgical History:  Procedure Laterality Date  . BLADDER SURGERY    . BRAIN SURGERY     plate in head per patient since childhood   . PORTACATH PLACEMENT Left 07/21/2019   Procedure: INSERTION PORT-A-CATH;  Surgeon: Olean Ree, MD;  Location: [facility] ORS;  Service: General;  Laterality: Left;    Family History  Problem Relation Age of Onset  . Cervical cancer Sister   . Kidney failure Mother   . Stroke Mother   . Alzheimer's disease Father     Social History   Socioeconomic History  . Marital status: Divorced    Spouse name: Not on file  . Number of children: Not on file  . Years of education: Not on file  . Highest education level: Not on file  Occupational History  . Not on file  Social Needs  . Financial resource strain: Not on file  . Food insecurity    Worry: Not on file    Inability: Not on file  . Transportation needs    Medical: Not on file    Non-medical: Not on file  Tobacco Use  . Smoking status: Former Smoker    Packs/day: 0.50    Quit date: 07/13/2019    Years since quitting: 0.0  . Smokeless tobacco: Never Used  Substance and Sexual Activity  .  Alcohol use: Not Currently    Frequency: Never    Comment: quit 3 years ago   . Drug use: Never  . Sexual activity: Not on file  Lifestyle  . Physical activity    Days per week: Not on file    Minutes per session: Not on file  . Stress: Not on file  Relationships  . Social Herbalist on phone: Not on file    Gets together: Not on file    Attends religious service: Not on  file    Active member of club or organization: Not on file    Attends meetings of clubs or organizations: Not on file    Relationship status: Not on file  . Intimate partner violence    Fear of current or ex partner: Not on file    Emotionally abused: Not on file    Physically abused: Not on file    Forced sexual activity: Not on file  Other Topics Concern  . Not on file  Social History Narrative   Lives at home with daughter and son in law.    Current Outpatient Medications on File Prior to Visit  Medication Sig Dispense Refill  . acetaminophen (TYLENOL) 325 MG tablet Take 325 mg by mouth every 4 (four) hours as needed for headache.    . metoprolol tartrate (LOPRESSOR) 25 MG tablet Take 1 tablet (25 mg total) by mouth 2 (two) times daily. 60 tablet 0  . nicotine (NICODERM CQ - DOSED IN MG/24 HOURS) 21 mg/24hr patch One patch transdermal chest wall daily (okay to substitute generic patch) (Patient taking differently: Place 21 mg onto the skin daily. ) 28 patch 0  . oxyCODONE (ROXICODONE) 5 MG immediate release tablet Take 1-2 tablets (5-10 mg total) by mouth every 4 (four) hours as needed for severe pain. 60 tablet 0  . pantoprazole (PROTONIX) 40 MG tablet Take 1 tablet (40 mg total) by mouth daily. 30 tablet 0  . polyethylene glycol (MIRALAX / GLYCOLAX) 17 g packet Take 17 g by mouth daily as needed for moderate constipation or severe constipation. (Patient taking differently: Take 17 g by mouth daily as needed for moderate constipation or severe constipation (constipation.). ) 14 each 0  . senna-docusate (SENOKOT-S) 8.6-50 MG tablet Take 2 tablets by mouth daily. 60 tablet 0  . dexamethasone (DECADRON) 4 MG tablet Take 2 tablets (8 mg total) by mouth daily. Start the day after carboplatin chemotherapy for 3 days. (Patient not taking: Reported on 08/05/2019) 30 tablet 1  . lidocaine-prilocaine (EMLA) cream Apply to affected area once (Patient not taking: Reported on 08/05/2019) 30 g 3  .  ondansetron (ZOFRAN) 8 MG tablet Take 1 tablet (8 mg total) by mouth 2 (two) times daily as needed for refractory nausea / vomiting. Start on day 3 after carboplatin chemo. (Patient not taking: Reported on 08/05/2019) 30 tablet 1  . prochlorperazine (COMPAZINE) 10 MG tablet Take 1 tablet (10 mg total) by mouth every 6 (six) hours as needed (Nausea or vomiting). (Patient not taking: Reported on 08/05/2019) 30 tablet 1  . sodium chloride flush (NS) 0.9 % SOLN 5 mLs by Intracatheter route every 8 (eight) hours. (Patient not taking: Reported on 08/05/2019) 100 Syringe 3   No current facility-administered medications on file prior to visit.     No Known Allergies     Observations/Objective: Today's Vitals   08/05/19 1404  PainSc: 0-No pain   There is no height or weight on file to  calculate BMI.  Physical Exam  Constitutional: He is oriented to person, place, and time. No distress.  Neurological: He is alert and oriented to person, place, and time.  Psychiatric: Affect normal.    CBC    Component Value Date/Time   WBC 7.4 07/26/2019 1011   RBC 4.49 07/26/2019 1011   HGB 13.4 07/26/2019 1011   HGB 8.7 (L) 03/02/2014 0948   HCT 40.2 07/26/2019 1011   HCT 26.2 (L) 03/02/2014 0948   PLT 183 07/26/2019 1011   PLT 136 (L) 03/02/2014 0948   MCV 89.5 07/26/2019 1011   MCV 88 03/02/2014 0948   MCH 29.8 07/26/2019 1011   MCHC 33.3 07/26/2019 1011   RDW 13.9 07/26/2019 1011   RDW 14.6 (H) 03/02/2014 0948   LYMPHSABS 1.1 07/26/2019 1011   LYMPHSABS 1.4 03/02/2014 0948   MONOABS 0.7 07/26/2019 1011   MONOABS 0.5 03/02/2014 0948   EOSABS 0.1 07/26/2019 1011   EOSABS 0.3 03/02/2014 0948   BASOSABS 0.0 07/26/2019 1011   BASOSABS 0.0 03/02/2014 0948    CMP     Component Value Date/Time   NA 135 07/26/2019 1011   NA 138 02/26/2014 0307   K 3.9 07/26/2019 1011   K 3.8 02/26/2014 0307   CL 99 07/26/2019 1011   CL 107 02/26/2014 0307   CO2 25 07/26/2019 1011   CO2 27 02/26/2014 0307    GLUCOSE 109 (H) 07/26/2019 1011   GLUCOSE 89 02/26/2014 0307   BUN 21 07/26/2019 1011   BUN 17 02/26/2014 0307   CREATININE 0.98 07/26/2019 1011   CREATININE 1.08 02/26/2014 0307   CALCIUM 8.6 (L) 07/26/2019 1011   CALCIUM 7.4 (L) 02/26/2014 0307   PROT 6.7 07/26/2019 1011   PROT 5.1 (L) 02/23/2014 2141   ALBUMIN 3.2 (L) 07/26/2019 1011   ALBUMIN 2.6 (L) 02/23/2014 2141   AST 25 07/26/2019 1011   AST 20 02/23/2014 2141   ALT 13 07/26/2019 1011   ALT 18 02/23/2014 2141   ALKPHOS 127 (H) 07/26/2019 1011   ALKPHOS 49 02/23/2014 2141   BILITOT 0.7 07/26/2019 1011   BILITOT 0.3 02/23/2014 2141   GFRNONAA >60 07/26/2019 1011   GFRNONAA >60 02/26/2014 0307   GFRAA >60 07/26/2019 1011   GFRAA >60 02/26/2014 0307     Assessment and Plan: 1. Malignant neoplasm of urinary bladder, unspecified site ([place])   2. Goals of care, counseling/discussion   3. Neoplasm related pain   4. Mass of left side of neck   5. Nausea without vomiting   6. Nephrostomy status ([place])      #I had a lengthy discussion with patient today. Patient reports that he has concerned about potential ototoxicity from chemotherapy and does not want to risk.  This is the main reason he declines chemotherapy.  He is interested in low intensity chemotherapy and immunotherapy.  I discussed with the rationale of chemotherapy and immunotherapy with him.  Potential side effects were discussed.  Patient changes his mind and is now agreeable with proceeding low intensity chemotherapy +/-immunotherapy. His NGS have been sent and result is pending. Discussed with patient that I will switch to chemotherapy regimen carboplatin and gemcitabine.  We will start with dose reduced regimen and see how he does.  He agrees with the plan.  #Nausea/vomiting, antiemetics prescription was sent after last visit.  Patient has not picked them up.  I went over details of antiemetics prescription instructions and advised patient to use antiemetics and see  if his symptoms  are improved.  Recommend patient to improve oral hydration with Gatorade.  I instructed RN Benjamine Mola to follow-up outpatient on 08/08/2019 to see if his symptoms getting better or not.  Patient is aware that he can call on-call physician during the weekend if symptoms not improved.  #Neoplasm related pain, symptoms are well controlled.  #Goals of care: Palliative #I will refer patient to establish care with palliative service #Left neck mass, biopsy was canceled due to patient's change of his decision.  I would proceed with chemotherapy without further delaying.  We will monitor left neck mass clinically. Follow Up Instructions:    I discussed the assessment and treatment plan with the patient. The patient was provided an opportunity to ask questions and all were answered. The patient agreed with the plan and demonstrated an understanding of the instructions.  The patient was advised to call back or seek an in-person evaluation if the symptoms worsen or if the condition fails to improve as anticipated.   Earlie Server, MD 08/06/2019 6:45 PM

## 2019-08-06 NOTE — Progress Notes (Signed)
DISCONTINUE ON PATHWAY REGIMEN - Bladder     A cycle is every 21 days:     Gemcitabine      Cisplatin   **Always confirm dose/schedule in your pharmacy ordering system**  REASON: Other Reason PRIOR TREATMENT: [id number]: Gemcitabine 1,000 mg/m2 D1, 8 + Cisplatin 70 mg/m2 D1 q21 Days for a Maximum of 6 Cycles TREATMENT RESPONSE: Unable to Evaluate  START ON PATHWAY REGIMEN - Bladder     A cycle is every 21 days:     Carboplatin      Gemcitabine   **Always confirm dose/schedule in your pharmacy ordering system**  Patient Characteristics: Advanced/Metastatic Disease, First Line, No Prior Platinum-Based Therapy, Poor Renal Function (CrCl < 50 mL/min), Unknown PD-L1 Expression Therapeutic Status: Advanced/Metastatic Disease Line of Therapy: First Line Prior Platinum-Based Therapy<= No Renal Function: Poor Renal Function (CrCl < 50 mL/min) PD-L1 Expression Status: Unknown PD-L1 Expression Intent of Therapy: Non-Curative / Palliative Intent, Discussed with Patient

## 2019-08-07 ENCOUNTER — Inpatient Hospital Stay
Admission: EM | Admit: 2019-08-07 | Discharge: 2019-08-19 | DRG: 374 | Disposition: A | Discharge status: Hospice | Payer: Medicare Other | Attending: Internal Medicine | Admitting: Internal Medicine

## 2019-08-07 ENCOUNTER — Emergency Department: Payer: Medicare Other

## 2019-08-07 ENCOUNTER — Encounter: Payer: Self-pay | Admitting: Emergency Medicine

## 2019-08-07 ENCOUNTER — Other Ambulatory Visit: Payer: Self-pay

## 2019-08-07 DIAGNOSIS — K56609 Unspecified intestinal obstruction, unspecified as to partial versus complete obstruction: Secondary | ICD-10-CM | POA: Diagnosis not present

## 2019-08-07 DIAGNOSIS — R112 Nausea with vomiting, unspecified: Secondary | ICD-10-CM

## 2019-08-07 DIAGNOSIS — N179 Acute kidney failure, unspecified: Secondary | ICD-10-CM | POA: Diagnosis present

## 2019-08-07 DIAGNOSIS — Z6821 Body mass index (BMI) 21.0-21.9, adult: Secondary | ICD-10-CM | POA: Diagnosis not present

## 2019-08-07 DIAGNOSIS — D72829 Elevated white blood cell count, unspecified: Secondary | ICD-10-CM | POA: Diagnosis not present

## 2019-08-07 DIAGNOSIS — R1900 Intra-abdominal and pelvic swelling, mass and lump, unspecified site: Secondary | ICD-10-CM | POA: Diagnosis present

## 2019-08-07 DIAGNOSIS — E860 Dehydration: Secondary | ICD-10-CM | POA: Diagnosis present

## 2019-08-07 DIAGNOSIS — I700 Atherosclerosis of aorta: Secondary | ICD-10-CM | POA: Diagnosis present

## 2019-08-07 DIAGNOSIS — T82898A Other specified complication of vascular prosthetic devices, implants and grafts, initial encounter: Secondary | ICD-10-CM | POA: Diagnosis present

## 2019-08-07 DIAGNOSIS — A419 Sepsis, unspecified organism: Secondary | ICD-10-CM | POA: Diagnosis not present

## 2019-08-07 DIAGNOSIS — Z720 Tobacco use: Secondary | ICD-10-CM | POA: Diagnosis not present

## 2019-08-07 DIAGNOSIS — Z20828 Contact with and (suspected) exposure to other viral communicable diseases: Secondary | ICD-10-CM | POA: Diagnosis present

## 2019-08-07 DIAGNOSIS — R140 Abdominal distension (gaseous): Secondary | ICD-10-CM

## 2019-08-07 DIAGNOSIS — Z79899 Other long term (current) drug therapy: Secondary | ICD-10-CM

## 2019-08-07 DIAGNOSIS — C679 Malignant neoplasm of bladder, unspecified: Secondary | ICD-10-CM | POA: Diagnosis present

## 2019-08-07 DIAGNOSIS — C678 Malignant neoplasm of overlapping sites of bladder: Secondary | ICD-10-CM | POA: Diagnosis not present

## 2019-08-07 DIAGNOSIS — R64 Cachexia: Secondary | ICD-10-CM | POA: Diagnosis present

## 2019-08-07 DIAGNOSIS — Z66 Do not resuscitate: Secondary | ICD-10-CM | POA: Diagnosis present

## 2019-08-07 DIAGNOSIS — T380X5A Adverse effect of glucocorticoids and synthetic analogues, initial encounter: Secondary | ICD-10-CM | POA: Diagnosis not present

## 2019-08-07 DIAGNOSIS — C786 Secondary malignant neoplasm of retroperitoneum and peritoneum: Secondary | ICD-10-CM | POA: Diagnosis present

## 2019-08-07 DIAGNOSIS — Z7189 Other specified counseling: Secondary | ICD-10-CM | POA: Diagnosis not present

## 2019-08-07 DIAGNOSIS — Y838 Other surgical procedures as the cause of abnormal reaction of the patient, or of later complication, without mention of misadventure at the time of the procedure: Secondary | ICD-10-CM | POA: Diagnosis present

## 2019-08-07 DIAGNOSIS — D696 Thrombocytopenia, unspecified: Secondary | ICD-10-CM | POA: Diagnosis present

## 2019-08-07 DIAGNOSIS — J439 Emphysema, unspecified: Secondary | ICD-10-CM | POA: Diagnosis present

## 2019-08-07 DIAGNOSIS — Z515 Encounter for palliative care: Secondary | ICD-10-CM | POA: Diagnosis not present

## 2019-08-07 DIAGNOSIS — E43 Unspecified severe protein-calorie malnutrition: Secondary | ICD-10-CM | POA: Diagnosis present

## 2019-08-07 DIAGNOSIS — C7911 Secondary malignant neoplasm of bladder: Secondary | ICD-10-CM

## 2019-08-07 DIAGNOSIS — R946 Abnormal results of thyroid function studies: Secondary | ICD-10-CM | POA: Diagnosis present

## 2019-08-07 DIAGNOSIS — N5089 Other specified disorders of the male genital organs: Secondary | ICD-10-CM | POA: Diagnosis present

## 2019-08-07 DIAGNOSIS — Z936 Other artificial openings of urinary tract status: Secondary | ICD-10-CM | POA: Diagnosis not present

## 2019-08-07 LAB — CBC
HCT: 44.7 % (ref 39.0–52.0)
Hemoglobin: 14.9 g/dL (ref 13.0–17.0)
MCH: 28.9 pg (ref 26.0–34.0)
MCHC: 33.3 g/dL (ref 30.0–36.0)
MCV: 86.8 fL (ref 80.0–100.0)
Platelets: 226 10*3/uL (ref 150–400)
RBC: 5.15 MIL/uL (ref 4.22–5.81)
RDW: 13.9 % (ref 11.5–15.5)
WBC: 18.8 10*3/uL — ABNORMAL HIGH (ref 4.0–10.5)
nRBC: 0 % (ref 0.0–0.2)

## 2019-08-07 LAB — COMPREHENSIVE METABOLIC PANEL
ALT: 18 U/L (ref 0–44)
AST: 32 U/L (ref 15–41)
Albumin: 3.7 g/dL (ref 3.5–5.0)
Alkaline Phosphatase: 143 U/L — ABNORMAL HIGH (ref 38–126)
Anion gap: 20 — ABNORMAL HIGH (ref 5–15)
BUN: 45 mg/dL — ABNORMAL HIGH (ref 8–23)
CO2: 23 mmol/L (ref 22–32)
Calcium: 9.1 mg/dL (ref 8.9–10.3)
Chloride: 90 mmol/L — ABNORMAL LOW (ref 98–111)
Creatinine, Ser: 1.45 mg/dL — ABNORMAL HIGH (ref 0.61–1.24)
GFR calc Af Amer: 57 mL/min — ABNORMAL LOW (ref >60)
GFR calc non Af Amer: 49 mL/min — ABNORMAL LOW (ref >60)
Glucose, Bld: 142 mg/dL — ABNORMAL HIGH (ref 70–99)
Potassium: 4.8 mmol/L (ref 3.5–5.1)
Sodium: 133 mmol/L — ABNORMAL LOW (ref 135–145)
Total Bilirubin: 1.4 mg/dL — ABNORMAL HIGH (ref 0.3–1.2)
Total Protein: 7.4 g/dL (ref 6.5–8.1)

## 2019-08-07 LAB — LACTIC ACID, PLASMA: Lactic Acid, Venous: 1.7 mmol/L (ref 0.5–1.9)

## 2019-08-07 LAB — LIPASE, BLOOD: Lipase: <10 U/L — ABNORMAL LOW (ref 11–51)

## 2019-08-07 LAB — URINALYSIS, COMPLETE (UACMP) WITH MICROSCOPIC
Bilirubin Urine: NEGATIVE
Glucose, UA: NEGATIVE mg/dL
Ketones, ur: 20 mg/dL — AB
Nitrite: POSITIVE — AB
Protein, ur: 100 mg/dL — AB
RBC / HPF: >50 RBC/hpf — ABNORMAL HIGH (ref 0–5)
Specific Gravity, Urine: 1.036 — ABNORMAL HIGH (ref 1.005–1.030)
WBC, UA: >50 WBC/hpf — ABNORMAL HIGH (ref 0–5)
pH: 5 (ref 5.0–8.0)

## 2019-08-07 LAB — TSH: TSH: 5.936 u[IU]/mL — ABNORMAL HIGH (ref 0.350–4.500)

## 2019-08-07 MED ORDER — POLYETHYLENE GLYCOL 3350 17 G PO PACK: 17.0000 g | PACK | Freq: Every day | ORAL | Status: DC | PRN

## 2019-08-07 MED ORDER — PROCHLORPERAZINE EDISYLATE 10 MG/2ML IJ SOLN
10.0000 mg | Freq: Four times a day (QID) | INTRAMUSCULAR | Status: DC | PRN
  Filled 2019-08-07: qty 2

## 2019-08-07 MED ORDER — VANCOMYCIN HCL 10 G IV SOLR
1750.0000 mg | Freq: Once | INTRAVENOUS | Status: AC
  Administered 2019-08-07: 1750 mg via INTRAVENOUS
  Filled 2019-08-07: qty 1750

## 2019-08-07 MED ORDER — MORPHINE SULFATE (PF) 4 MG/ML IV SOLN: 4.0000 mg | INTRAVENOUS | Status: DC | PRN

## 2019-08-07 MED ORDER — VANCOMYCIN HCL IN DEXTROSE 1-5 GM/200ML-% IV SOLN: 1000.0000 mg | Freq: Once | INTRAVENOUS | Status: DC

## 2019-08-07 MED ORDER — NICOTINE 21 MG/24HR TD PT24
21.0000 mg | MEDICATED_PATCH | Freq: Every day | TRANSDERMAL | Status: DC
  Administered 2019-08-07 – 2019-08-18 (×12): 21 mg via TRANSDERMAL
  Filled 2019-08-07 (×12): qty 1

## 2019-08-07 MED ORDER — SODIUM CHLORIDE 0.9 % IV SOLN
INTRAVENOUS | Status: DC
  Administered 2019-08-07 – 2019-08-13 (×16): via INTRAVENOUS

## 2019-08-07 MED ORDER — CHLORHEXIDINE GLUCONATE CLOTH 2 % EX PADS
6.0000 | MEDICATED_PAD | Freq: Every day | CUTANEOUS | Status: DC
  Administered 2019-08-07 – 2019-08-19 (×12): 6 via TOPICAL

## 2019-08-07 MED ORDER — IOHEXOL 300 MG/ML  SOLN
100.0000 mL | Freq: Once | INTRAMUSCULAR | Status: AC | PRN
  Administered 2019-08-07: 100 mL via INTRAVENOUS

## 2019-08-07 MED ORDER — SODIUM CHLORIDE 0.9 % IV BOLUS
1000.0000 mL | Freq: Once | INTRAVENOUS | Status: AC
  Administered 2019-08-07: 15:00:00 1000 mL via INTRAVENOUS

## 2019-08-07 MED ORDER — PANTOPRAZOLE SODIUM 40 MG PO TBEC: 40.0000 mg | DELAYED_RELEASE_TABLET | Freq: Every day | ORAL | Status: DC

## 2019-08-07 MED ORDER — ENOXAPARIN SODIUM 40 MG/0.4ML ~~LOC~~ SOLN
40.0000 mg | SUBCUTANEOUS | Status: DC
  Administered 2019-08-07 – 2019-08-13 (×7): 40 mg via SUBCUTANEOUS
  Filled 2019-08-07 (×7): qty 0.4

## 2019-08-07 MED ORDER — ACETAMINOPHEN 650 MG RE SUPP: 650.0000 mg | Freq: Four times a day (QID) | RECTAL | Status: DC | PRN

## 2019-08-07 MED ORDER — ACETAMINOPHEN 325 MG PO TABS
650.0000 mg | ORAL_TABLET | Freq: Four times a day (QID) | ORAL | Status: DC | PRN
  Administered 2019-08-16 – 2019-08-19 (×7): 650 mg via ORAL
  Filled 2019-08-07 (×9): qty 2

## 2019-08-07 MED ORDER — MORPHINE SULFATE (PF) 2 MG/ML IV SOLN
2.0000 mg | INTRAVENOUS | Status: DC | PRN
  Administered 2019-08-07: 2 mg via INTRAVENOUS
  Filled 2019-08-07: qty 1

## 2019-08-07 MED ORDER — OXYCODONE HCL 5 MG PO TABS: 5.0000 mg | ORAL_TABLET | ORAL | Status: DC | PRN

## 2019-08-07 MED ORDER — SODIUM CHLORIDE 0.9% FLUSH
3.0000 mL | Freq: Once | INTRAVENOUS | Status: AC
  Administered 2019-08-07: 16:00:00 3 mL via INTRAVENOUS

## 2019-08-07 MED ORDER — ONDANSETRON HCL 4 MG/2ML IJ SOLN
4.0000 mg | Freq: Once | INTRAMUSCULAR | Status: AC
  Administered 2019-08-07: 4 mg via INTRAVENOUS
  Filled 2019-08-07: qty 2

## 2019-08-07 MED ORDER — ONDANSETRON HCL 4 MG/2ML IJ SOLN
4.0000 mg | Freq: Four times a day (QID) | INTRAMUSCULAR | Status: DC | PRN
  Administered 2019-08-13: 4 mg via INTRAVENOUS
  Filled 2019-08-07: qty 2

## 2019-08-07 MED ORDER — DOCUSATE SODIUM 100 MG PO CAPS
100.0000 mg | ORAL_CAPSULE | Freq: Two times a day (BID) | ORAL | Status: DC
  Administered 2019-08-11 – 2019-08-19 (×15): 100 mg via ORAL
  Filled 2019-08-07 (×17): qty 1

## 2019-08-07 MED ORDER — SODIUM CHLORIDE 0.9 % IV BOLUS
1000.0000 mL | Freq: Once | INTRAVENOUS | Status: AC
  Administered 2019-08-07: 1000 mL via INTRAVENOUS

## 2019-08-07 MED ORDER — ONDANSETRON HCL 4 MG PO TABS: 4.0000 mg | ORAL_TABLET | Freq: Four times a day (QID) | ORAL | Status: DC | PRN

## 2019-08-07 MED ORDER — SODIUM CHLORIDE 0.9 % IV SOLN
2.0000 g | Freq: Once | INTRAVENOUS | Status: AC
  Administered 2019-08-07: 16:00:00 2 g via INTRAVENOUS
  Filled 2019-08-07: qty 2

## 2019-08-07 MED ORDER — SENNOSIDES-DOCUSATE SODIUM 8.6-50 MG PO TABS
2.0000 | ORAL_TABLET | Freq: Every day | ORAL | Status: DC
  Administered 2019-08-12 – 2019-08-18 (×6): 2 via ORAL
  Filled 2019-08-07 (×8): qty 2

## 2019-08-07 MED ORDER — FENTANYL CITRATE (PF) 100 MCG/2ML IJ SOLN
50.0000 ug | Freq: Once | INTRAMUSCULAR | Status: AC
  Administered 2019-08-07: 50 ug via INTRAVENOUS
  Filled 2019-08-07: qty 2

## 2019-08-07 MED ORDER — METOPROLOL TARTRATE 25 MG PO TABS
25.0000 mg | ORAL_TABLET | Freq: Two times a day (BID) | ORAL | Status: DC
  Filled 2019-08-07: qty 1

## 2019-08-07 MED ORDER — METRONIDAZOLE IN NACL 5-0.79 MG/ML-% IV SOLN
500.0000 mg | Freq: Once | INTRAVENOUS | Status: AC
  Administered 2019-08-07: 17:00:00 500 mg via INTRAVENOUS
  Filled 2019-08-07: qty 100

## 2019-08-07 NOTE — Consult Note (Signed)
SURGICAL CONSULTATION NOTE   HISTORY OF PRESENT ILLNESS (HPI):  68 y.o. male presented to [facility] ED for evaluation of nausea and vomiting since 3 days ago. Patient reports vomit is green in color. Report associated pain. Generalized abdominal pain that does not radiates to other part of the body. No alleviating or aggravating factor. Denies fever or chills.  Patient went to ED and CT scan was done. CT shows severe small bowel and gastric dilation. I personally evaluated the images. Obstruction most likely due to large pelvic mass and peritoneal metastasis.  Surgery is consulted by Dr. Jimmye Norman in this context for evaluation and management of small bowel obstruction.  PAST MEDICAL HISTORY (PMH):  Past Medical History:  Diagnosis Date  . Bladder cancer ([place]) 07/21/2019  . Cancer [facility])    Bladder cancer     PAST SURGICAL HISTORY [facility]):  Past Surgical History:  Procedure Laterality Date  . BLADDER SURGERY    . BRAIN SURGERY     plate in head per patient since childhood   . PORTACATH PLACEMENT Left 07/21/2019   Procedure: INSERTION PORT-A-CATH;  Surgeon: Olean Ree, MD;  Location: [facility] ORS;  Service: General;  Laterality: Left;     MEDICATIONS:  Prior to Admission medications   Medication Sig Start Date End Date Taking? Authorizing Provider  metoprolol tartrate (LOPRESSOR) 25 MG tablet Take 1 tablet (25 mg total) by mouth 2 (two) times daily. 07/14/19  Yes Wieting, Richard, MD  oxyCODONE (ROXICODONE) 5 MG immediate release tablet Take 1-2 tablets (5-10 mg total) by mouth every 4 (four) hours as needed for severe pain. 07/27/19  Yes Borders, Kirt Boys, NP  pantoprazole (PROTONIX) 40 MG tablet Take 1 tablet (40 mg total) by mouth daily. 07/15/19  Yes Wieting, Richard, MD  acetaminophen (TYLENOL) 325 MG tablet Take 325 mg by mouth every 4 (four) hours as needed for headache.    [provider]  dexamethasone (DECADRON) 4 MG tablet Take 2 tablets (8 mg total) by mouth daily. Start the  day after carboplatin chemotherapy for 3 days. Patient not taking: Reported on 08/05/2019 07/26/19   Earlie Server, MD  nicotine (NICODERM CQ - DOSED IN MG/24 HOURS) 21 mg/24hr patch One patch transdermal chest wall daily (okay to substitute generic patch) Patient taking differently: Place 21 mg onto the skin daily.  07/14/19   Loletha Grayer, MD  ondansetron (ZOFRAN) 8 MG tablet Take 1 tablet (8 mg total) by mouth 2 (two) times daily as needed for refractory nausea / vomiting. Start on day 3 after carboplatin chemo. Patient not taking: Reported on 08/05/2019 07/26/19   Earlie Server, MD  polyethylene glycol (MIRALAX / GLYCOLAX) 17 g packet Take 17 g by mouth daily as needed for moderate constipation or severe constipation. Patient taking differently: Take 17 g by mouth daily as needed for moderate constipation or severe constipation (constipation.).  07/14/19   Loletha Grayer, MD  prochlorperazine (COMPAZINE) 10 MG tablet Take 1 tablet (10 mg total) by mouth every 6 (six) hours as needed (Nausea or vomiting). Patient not taking: Reported on 08/05/2019 07/26/19   Earlie Server, MD  senna-docusate (SENOKOT-S) 8.6-50 MG tablet Take 2 tablets by mouth daily. 07/15/19   Loletha Grayer, MD     ALLERGIES:  No Known Allergies   SOCIAL HISTORY:  Social History   Socioeconomic History  . Marital status: Divorced    Spouse name: Not on file  . Number of children: Not on file  . Years of education: Not on file  .  Highest education level: Not on file  Occupational History  . Not on file  Social Needs  . Financial resource strain: Not on file  . Food insecurity    Worry: Not on file    Inability: Not on file  . Transportation needs    Medical: Not on file    Non-medical: Not on file  Tobacco Use  . Smoking status: Former Smoker    Packs/day: 0.50    Quit date: 07/13/2019    Years since quitting: 0.0  . Smokeless tobacco: Never Used  Substance and Sexual Activity  . Alcohol use: Not Currently     Frequency: Never    Comment: quit 3 years ago   . Drug use: Never  . Sexual activity: Not on file  Lifestyle  . Physical activity    Days per week: Not on file    Minutes per session: Not on file  . Stress: Not on file  Relationships  . Social Herbalist on phone: Not on file    Gets together: Not on file    Attends religious service: Not on file    Active member of club or organization: Not on file    Attends meetings of clubs or organizations: Not on file    Relationship status: Not on file  . Intimate partner violence    Fear of current or ex partner: Not on file    Emotionally abused: Not on file    Physically abused: Not on file    Forced sexual activity: Not on file  Other Topics Concern  . Not on file  Social History Narrative   Lives at home with daughter and son in law.    The patient currently resides (home / rehab facility / nursing home): Home The patient normally is (ambulatory / bedbound): Ambulatory   FAMILY HISTORY:  Family History  Problem Relation Age of Onset  . Cervical cancer Sister   . Kidney failure Mother   . Stroke Mother   . Alzheimer's disease Father      REVIEW OF SYSTEMS:  Constitutional: denies weight loss, fever, chills, or sweats  Eyes: denies any other vision changes, history of eye injury  ENT: denies sore throat, hearing problems  Respiratory: denies shortness of breath, wheezing  Cardiovascular: denies chest pain, palpitations  Gastrointestinal: Postive abdominal pain, N/V,  Genitourinary: denies burning with urination or urinary frequency Musculoskeletal: denies any other joint pains or cramps  Skin: denies any other rashes or skin discolorations  Neurological: denies any other headache, dizziness, weakness  Psychiatric: denies any other depression, anxiety   All other review of systems were negative   VITAL SIGNS:  Temp:  [97.9 F (36.6 C)-98 F (36.7 C)] 97.9 F (36.6 C) (10/25 1959) Pulse Rate:  [88-116] 93  (10/25 1959) Resp:  [16-25] 18 (10/25 1959) BP: (126-160)/(65-90) 141/65 (10/25 1959) SpO2:  [93 %-98 %] 98 % (10/25 1959)             INTAKE/OUTPUT:  This shift: No intake/output data recorded.  Last 2 shifts: @IOLAST2SHIFTS @   PHYSICAL EXAM:  Constitutional:  -- Normal body habitus  -- Awake, alert, and oriented x3  Eyes:  -- Pupils equally round and reactive to light  -- No scleral icterus  Ear, nose, and throat:  -- No jugular venous distension  Pulmonary:  -- No crackles  -- Equal breath sounds bilaterally -- Breathing non-labored at rest Cardiovascular:  -- S1, S2 present  -- No pericardial rubs  Gastrointestinal:  -- Abdomen soft, nontender, distended, no guarding or rebound tenderness Musculoskeletal and Integumentary:  -- Wounds or skin discoloration: None appreciated -- Extremities: B/L UE and LE FROM, hands and feet warm, no edema  Neurologic:  -- Motor function: intact and symmetric -- Sensation: intact and symmetric   Labs:  CBC Latest Ref Rng & Units 08/07/2019 07/26/2019 07/12/2019  WBC 4.0 - 10.5 K/uL 18.8(H) 7.4 9.2  Hemoglobin 13.0 - 17.0 g/dL 14.9 13.4 12.7(L)  Hematocrit 39.0 - 52.0 % 44.7 40.2 37.7(L)  Platelets 150 - 400 K/uL 226 183 189   CMP Latest Ref Rng & Units 08/07/2019 07/26/2019 07/14/2019  Glucose 70 - 99 mg/dL 142(H) 109(H) 108(H)  BUN 8 - 23 mg/dL 45(H) 21 24(H)  Creatinine 0.61 - 1.24 mg/dL 1.45(H) 0.98 1.24  Sodium 135 - 145 mmol/L 133(L) 135 137  Potassium 3.5 - 5.1 mmol/L 4.8 3.9 4.2  Chloride 98 - 111 mmol/L 90(L) 99 100  CO2 22 - 32 mmol/L 23 25 27   Calcium 8.9 - 10.3 mg/dL 9.1 8.6(L) 8.4(L)  Total Protein 6.5 - 8.1 g/dL 7.4 6.7 -  Total Bilirubin 0.3 - 1.2 mg/dL 1.4(H) 0.7 -  Alkaline Phos 38 - 126 U/L 143(H) 127(H) -  AST 15 - 41 U/L 32 25 -  ALT 0 - 44 U/L 18 13 -     Imaging studies:  EXAM: CT ABDOMEN AND PELVIS WITH CONTRAST  TECHNIQUE: Multidetector CT imaging of the abdomen and pelvis was performed using the  standard protocol following bolus administration of intravenous contrast.  CONTRAST:  132mL OMNIPAQUE IOHEXOL 300 MG/ML  SOLN  COMPARISON:  PET-CT, 07/25/2019  FINDINGS: Lower chest: No acute abnormality. Metastatic nodules in the partially included lingula (series 4, image 9). Emphysema.  Hepatobiliary: No solid liver abnormality is seen. No gallstones, gallbladder wall thickening, or biliary dilatation.  Pancreas: Unremarkable. No pancreatic ductal dilatation or surrounding inflammatory changes.  Spleen: Normal in size without significant abnormality.  Adrenals/Urinary Tract: Adrenal glands are unremarkable. Right-sided percutaneous nephrostomy with formed pigtail in the right renal pelvis. No hydronephrosis. Redemonstrated, bulky, predominantly endoluminal bladder mass and soft tissue nodules.  Stomach/Bowel: There is redemonstrated, extensive, bulky, metastatic tissue involving the omentum, multiple loops of small bowel, and the anterior abdominal wall. The stomach and proximal to mid small bowel are diffusely fluid distended, with an abrupt transition point in the central abdomen abutting metastatic soft tissue (series 2, image 59) and decompression of the distal small bowel.  Vascular/Lymphatic: Aortic atherosclerosis. Multiple enlarged retroperitoneal, iliac, and pelvic sidewall lymph nodes as seen on prior examination.  Reproductive: No mass or other significant abnormality.  Other: No abdominal wall hernia or abnormality. No abdominopelvic ascites.  Musculoskeletal: No acute or significant osseous findings.  IMPRESSION: 1. There is redemonstrated, extensive, bulky, metastatic tissue involving the omentum, multiple loops of small bowel, and the anterior abdominal wall, not significant changed in appearance compared to prior PET-CT examination dated 07/25/2019. The stomach and proximal to mid small bowel are diffusely fluid distended, with an abrupt  transition point in the central abdomen abutting metastatic soft tissue (series 2, image 59) and decompression of the distal small bowel. Findings are consistent with small bowel obstruction.  2. Other findings of primary bladder malignancy and advanced metastatic disease as detailed above, not significantly changed in comparison to PET-CT dated 07/25/2019.  3. Aortic Atherosclerosis (ICD10-I70.0) and Emphysema (ICD10-J43.9).   Electronically Signed   By: Eddie Candle M.D.   On: 08/07/2019 16:15  Assessment/Plan:  68  y.o. male with bowel obstruction, complicated by pertinent comorbidities including metastatic bladder cancer.  Patient with bowel obstruction most likely due to the large pelvic mass and the peritoneal implants. This are very difficult bowel obstruction to treat because some does not resolve with conservative management and sometimes surgical management is impossible due to frozen abdomen due to peritoneal implants. So far patient had feel some relieve after the NGT was places. Patient is laying down comfortable in bed. The abdomen is distended but no peritoneal sign. No indications of surgical management at this moment. Appreciate medical management by Hospitalist. Will follow with serial physical exams, vital signs and labs.   Arnold Long, MD

## 2019-08-07 NOTE — ED Provider Notes (Signed)
IMPRESSION: 1. There is redemonstrated, extensive, bulky, metastatic tissue involving the omentum, multiple loops of small bowel, and the anterior abdominal wall, not significant changed in appearance compared to prior PET-CT examination dated 07/25/2019. The stomach and proximal to mid small bowel are diffusely fluid distended, with an abrupt transition point in the central abdomen abutting metastatic soft tissue (series 2, image 59) and decompression of the distal small bowel. Findings are consistent with small bowel obstruction.  2. Other findings of primary bladder malignancy and advanced metastatic disease as detailed above, not significantly changed in comparison to PET-CT dated 07/25/2019.  3. Aortic Atherosclerosis (ICD10-I70.0) and Emphysema (ICD10-J43.9).   CT scan as dictated above, he does have a small bowel obstruction.  We will attempt NG tube placement and consult general surgery.   Earleen Newport, MD 08/07/19 [phone]

## 2019-08-07 NOTE — ED Notes (Signed)
Patient requesting pain medication for his back.  Orders reviewed, patient only has oral pain medication ordered, MD messaged to see if a different med and route might be considered due to patient having NG tube.

## 2019-08-07 NOTE — ED Triage Notes (Signed)
Pt to ED via POV c/o emesis x 3 days. Pt states that he is not able to keep anything down. Pt has hx/o bladder cancer, was supposed to start chemo on Wednesday. Pt is in NAD at this time.

## 2019-08-07 NOTE — Consult Note (Signed)
PHARMACY -  BRIEF ANTIBIOTIC NOTE   Pharmacy has received consult(s) for Cefepime/Vancomycin for Intra-abdominal infection from an ED provider.  The patient's profile has been reviewed for ht/wt/allergies/indication/available labs.    One time order(s) placed for Cefepime 2gx1 dose and Vancomycin 1750 mg x1 dose.  Further antibiotics/pharmacy consults should be ordered by admitting physician if indicated.                       Thank you, Rowland Lathe 08/07/2019  3:28 PM

## 2019-08-07 NOTE — ED Provider Notes (Addendum)
[facility] Emergency Department Provider Note  ____________________________________________   First MD Initiated Contact with Patient 08/07/19 1420     (approximate)  I have reviewed the triage vital signs and the nursing notes.   HISTORY  Chief Complaint Emesis    HPI Johnny Martin is a 68 y.o. male with bladder cancer who supposed to start chemotherapy on Wednesday who presents with emesis x3 days.  Patient had nonbloody nonbilious emesis that is severe, intermittent, nothing makes better, nothing makes it worse.  He also has abdominal distention and pain associated with it.  He has a right PCN drain due to obstruction from the bladder cancer met.  He still is able to make urine.  No fevers.          Past Medical History:  Diagnosis Date  . Bladder cancer ([place]) 07/21/2019  . Cancer [facility])    Bladder cancer    Patient Active Problem List   Diagnosis Date Noted  . Goals of care, counseling/discussion 07/21/2019  . Bladder cancer ([place]) 07/21/2019  . Drug-induced constipation   . Bladder mass 07/11/2019  . Hydronephrosis due to obstruction of ureter 07/11/2019  . Gross hematuria   . Peritoneal carcinomatosis ([place])   . Mass of left side of neck   . AKI (acute kidney injury) [facility])     Past Surgical History:  Procedure Laterality Date  . BLADDER SURGERY    . BRAIN SURGERY     plate in head per patient since childhood   . PORTACATH PLACEMENT Left 07/21/2019   Procedure: INSERTION PORT-A-CATH;  Surgeon: Olean Ree, MD;  Location: [facility] ORS;  Service: General;  Laterality: Left;    Prior to Admission medications   Medication Sig Start Date End Date Taking? Authorizing Provider  acetaminophen (TYLENOL) 325 MG tablet Take 325 mg by mouth every 4 (four) hours as needed for headache.    [provider]  dexamethasone (DECADRON) 4 MG tablet Take 2 tablets (8 mg total) by mouth daily. Start the day after carboplatin chemotherapy for 3  days. Patient not taking: Reported on 08/05/2019 07/26/19   Earlie Server, MD  lidocaine-prilocaine (EMLA) cream Apply to affected area once Patient not taking: Reported on 08/05/2019 07/26/19   Earlie Server, MD  metoprolol tartrate (LOPRESSOR) 25 MG tablet Take 1 tablet (25 mg total) by mouth 2 (two) times daily. 07/14/19   Loletha Grayer, MD  nicotine (NICODERM CQ - DOSED IN MG/24 HOURS) 21 mg/24hr patch One patch transdermal chest wall daily (okay to substitute generic patch) Patient taking differently: Place 21 mg onto the skin daily.  07/14/19   Loletha Grayer, MD  ondansetron (ZOFRAN) 8 MG tablet Take 1 tablet (8 mg total) by mouth 2 (two) times daily as needed for refractory nausea / vomiting. Start on day 3 after carboplatin chemo. Patient not taking: Reported on 08/05/2019 07/26/19   Earlie Server, MD  oxyCODONE (ROXICODONE) 5 MG immediate release tablet Take 1-2 tablets (5-10 mg total) by mouth every 4 (four) hours as needed for severe pain. 07/27/19   Borders, Kirt Boys, NP  pantoprazole (PROTONIX) 40 MG tablet Take 1 tablet (40 mg total) by mouth daily. 07/15/19   Loletha Grayer, MD  polyethylene glycol (MIRALAX / GLYCOLAX) 17 g packet Take 17 g by mouth daily as needed for moderate constipation or severe constipation. Patient taking differently: Take 17 g by mouth daily as needed for moderate constipation or severe constipation (constipation.).  07/14/19   Loletha Grayer, MD  prochlorperazine (  COMPAZINE) 10 MG tablet Take 1 tablet (10 mg total) by mouth every 6 (six) hours as needed (Nausea or vomiting). Patient not taking: Reported on 08/05/2019 07/26/19   Earlie Server, MD  senna-docusate (SENOKOT-S) 8.6-50 MG tablet Take 2 tablets by mouth daily. 07/15/19   Wieting, Richard, MD  sodium chloride flush (NS) 0.9 % SOLN 5 mLs by Intracatheter route every 8 (eight) hours. Patient not taking: Reported on 08/05/2019 07/14/19   Loletha Grayer, MD    Allergies Patient has no known allergies.  Family  History  Problem Relation Age of Onset  . Cervical cancer Sister   . Kidney failure Mother   . Stroke Mother   . Alzheimer's disease Father     Social History Social History   Tobacco Use  . Smoking status: Former Smoker    Packs/day: 0.50    Quit date: 07/13/2019    Years since quitting: 0.0  . Smokeless tobacco: Never Used  Substance Use Topics  . Alcohol use: Not Currently    Frequency: Never    Comment: quit 3 years ago   . Drug use: Never      Review of Systems Constitutional: No fever/chills Eyes: No visual changes. ENT: No sore throat. Cardiovascular: Denies chest pain. Respiratory: Denies shortness of breath. Gastrointestinal: Positive abdominal pain and vomiting no diarrhea.  No constipation. Genitourinary: Negative for dysuria. Musculoskeletal: Negative for back pain. Skin: Negative for rash. Neurological: Negative for headaches, focal weakness or numbness. All other ROS negative ____________________________________________   PHYSICAL EXAM:  VITAL SIGNS: ED Triage Vitals  Enc Vitals Group     BP 08/07/19 1107 140/87     Pulse Rate 08/07/19 1107 (!) 116     Resp 08/07/19 1107 16     Temp 08/07/19 1107 98 F (36.7 C)     Temp Source 08/07/19 1107 Oral     SpO2 08/07/19 1107 96 %     Weight --      Height --      Head Circumference --      Peak Flow --      Pain Score 08/07/19 1112 6     Pain Loc --      Pain Edu? --      Excl. in [place]? --     Constitutional: Alert and oriented. Well appearing and in no acute distress. Eyes: Conjunctivae are normal. EOMI. Head: Atraumatic. Nose: No congestion/rhinnorhea. Mouth/Throat: Mucous membranes are moist.  No crepitus Neck: No stridor. Trachea Midline. FROM Cardiovascular: Normal rate, regular rhythm. Grossly normal heart sounds.  Good peripheral circulation. Respiratory: Normal respiratory effort.  No retractions. Lungs CTAB. Gastrointestinal: Soft slightly distended with tenderness.  Right PCN drain  in place that is draining. Musculoskeletal: No lower extremity tenderness nor edema.  No joint effusions. Neurologic:  Normal speech and language. No gross focal neurologic deficits are appreciated.  Skin:  Skin is warm, dry and intact. No rash noted. Psychiatric: Mood and affect are normal. Speech and behavior are normal. GU: Deferred   ____________________________________________   LABS (all labs ordered are listed, but only abnormal results are displayed)  Labs Reviewed  LIPASE, BLOOD - Abnormal; Notable for the following components:      Result Value   Lipase <10 (*)    All other components within normal limits  COMPREHENSIVE METABOLIC PANEL - Abnormal; Notable for the following components:   Sodium 133 (*)    Chloride 90 (*)    Glucose, Bld 142 (*)    BUN 45 (*)  Creatinine, Ser 1.45 (*)    Alkaline Phosphatase 143 (*)    Total Bilirubin 1.4 (*)    GFR calc non Af Amer 49 (*)    GFR calc Af Amer 57 (*)    Anion gap 20 (*)    All other components within normal limits  CBC - Abnormal; Notable for the following components:   WBC 18.8 (*)    All other components within normal limits  CULTURE, BLOOD (ROUTINE X 2)  CULTURE, BLOOD (ROUTINE X 2)  LACTIC ACID, PLASMA  URINALYSIS, COMPLETE (UACMP) WITH MICROSCOPIC  LACTIC ACID, PLASMA   ____________________________________________    RADIOLOGY Robert Bellow, personally viewed and evaluated these images (plain radiographs) as part of my medical decision making, as well as reviewing the written report by the radiologist.  ED MD interpretation: No pneumonia.  Official radiology report(s): Dg Chest Portable 1 View  Result Date: 08/07/2019 CLINICAL DATA:  Elevated white count, emesis for 3 days. EXAM: PORTABLE CHEST 1 VIEW COMPARISON:  Chest radiograph dated 07/21/2019 FINDINGS: The heart size and mediastinal contours are within normal limits. Emphysematous changes are noted. Both lungs are clear. The visualized skeletal  structures are unremarkable. A left subclavian approach central venous port catheter tip overlies the superior vena cava. A pigtail catheter overlies the right upper quadrant. IMPRESSION: No acute cardiopulmonary findings. Emphysema (ICD10-J43.9). Electronically Signed   By: Zerita Boers M.D.   On: 08/07/2019 15:17    ____________________________________________   PROCEDURES  Procedure(s) performed (including Critical Care):  .Critical Care Performed by: Vanessa [place], MD Authorized by: Vanessa [place], MD   Critical care provider statement:    Critical care time (minutes):  30   Critical care was necessary to treat or prevent imminent or life-threatening deterioration of the following conditions:  Sepsis   Critical care was time spent personally by me on the following activities:  Discussions with consultants, evaluation of patient's response to treatment, examination of patient, ordering and performing treatments and interventions, ordering and review of laboratory studies, ordering and review of radiographic studies, pulse oximetry, re-evaluation of patient's condition, obtaining history from patient or surrogate and review of old charts     ____________________________________________   INITIAL IMPRESSION / ASSESSMENT AND PLAN / ED COURSE  DERWIN REDDY was evaluated in Emergency Department on 08/07/2019 for the symptoms described in the history of present illness. He was evaluated in the context of the global COVID-19 pandemic, which necessitated consideration that the patient might be at risk for infection with the SARS-CoV-2 virus that causes COVID-19. Institutional protocols and algorithms that pertain to the evaluation of patients at risk for COVID-19 are in a state of rapid change based on information released by regulatory bodies including the CDC and federal and state organizations. These policies and algorithms were followed during the patient's care in the ED.    Patient  presents with abdominal pain and vomiting.  Given patient's history of metastatic bladder cancer will get CT scan to rule out obstruction, appendicitis, perforation.  Will get labs to evaluate for electrolyte abnormalities, AKI.  Labs are notable for a white count of 18.8.  This is double patient's baseline.  Given patient was initially tachycardic will get blood cultures and lactate given he meets sepsis criteria and will cover broadly with antibiotics.  Kidney function is 1.4 which is also elevated from baseline mostly secondary to dehydration.  Patient handed off to oncoming team pending CT scan to rule out obstruction.  If negative would  recommend admission for sepsis rule out and symptomatic treatment.      ____________________________________________   FINAL CLINICAL IMPRESSION(S) / ED DIAGNOSES   Final diagnoses:  Sepsis, due to unspecified organism, unspecified whether acute organ dysfunction present ([place])  Nausea and vomiting, intractability of vomiting not specified, unspecified vomiting type  Dehydration  AKI (acute kidney injury) ([place])      MEDICATIONS GIVEN DURING THIS VISIT:  Medications  sodium chloride flush (NS) 0.9 % injection 3 mL (has no administration in time range)  iohexol (OMNIPAQUE) 300 MG/ML solution 100 mL (has no administration in time range)  ceFEPIme (MAXIPIME) 2 g in sodium chloride 0.9 % 100 mL IVPB (has no administration in time range)  metroNIDAZOLE (FLAGYL) IVPB 500 mg (has no administration in time range)  vancomycin (VANCOCIN) IVPB 1000 mg/200 mL premix (has no administration in time range)  sodium chloride 0.9 % bolus 1,000 mL (has no administration in time range)  sodium chloride 0.9 % bolus 1,000 mL (1,000 mLs Intravenous New Bag/Given 08/07/19 1458)  ondansetron (ZOFRAN) injection 4 mg (4 mg Intravenous Given 08/07/19 1501)  fentaNYL (SUBLIMAZE) injection 50 mcg (50 mcg Intravenous Given 08/07/19 1501)     ED Discharge Orders    None        Note:  This document was prepared using Dragon voice recognition software and may include unintentional dictation errors.   Vanessa [place], MD 08/07/19 1525    Vanessa [place], MD 08/15/19 1054

## 2019-08-07 NOTE — Progress Notes (Signed)
CODE SEPSIS - PHARMACY COMMUNICATION  **Broad Spectrum Antibiotics should be administered within 1 hour of Sepsis diagnosis**  Time Code Sepsis Called/Page Received: @1523   Antibiotics Ordered: Cefepime/Vancomycin   Time of 1st antibiotic administration: Cefepime @1556   Additional action taken by pharmacy: N/A  If necessary, Name of Provider/Nurse Contacted: N/A    Rowland Lathe ,PharmD Clinical Pharmacist  08/07/2019  3:28 PM

## 2019-08-07 NOTE — H&P (Addendum)
Johnny Martin is an 68 y.o. male.   Chief Complaint: Nausea and vomiting HPI: The patient with past medical history of metastatic bladder cancer presents to the emergency department complaining of 2 days of nausea and vomiting.  He has not been able to keep much food or drink down during that time.  The patient is also not taking his medications during this time.  CT of his abdomen revealed small bowel obstruction.  NG tube placed in the emergency department prior to the hospitalist service being called for admission.  Past Medical History:  Diagnosis Date  . Bladder cancer ([place]) 07/21/2019  . Cancer [facility])    Bladder cancer    Past Surgical History:  Procedure Laterality Date  . BLADDER SURGERY    . BRAIN SURGERY     plate in head per patient since childhood   . PORTACATH PLACEMENT Left 07/21/2019   Procedure: INSERTION PORT-A-CATH;  Surgeon: Olean Ree, MD;  Location: [facility] ORS;  Service: General;  Laterality: Left;    Family History  Problem Relation Age of Onset  . Cervical cancer Sister   . Kidney failure Mother   . Stroke Mother   . Alzheimer's disease Father    Social History:  reports that he quit smoking about 3 weeks ago. He smoked 0.50 packs per day. He has never used smokeless tobacco. He reports previous alcohol use. He reports that he does not use drugs.  Allergies: No Known Allergies  Medications Prior to Admission  Medication Sig Dispense Refill  . metoprolol tartrate (LOPRESSOR) 25 MG tablet Take 1 tablet (25 mg total) by mouth 2 (two) times daily. 60 tablet 0  . oxyCODONE (ROXICODONE) 5 MG immediate release tablet Take 1-2 tablets (5-10 mg total) by mouth every 4 (four) hours as needed for severe pain. 60 tablet 0  . pantoprazole (PROTONIX) 40 MG tablet Take 1 tablet (40 mg total) by mouth daily. 30 tablet 0  . acetaminophen (TYLENOL) 325 MG tablet Take 325 mg by mouth every 4 (four) hours as needed for headache.    . dexamethasone (DECADRON) 4 MG tablet Take 2  tablets (8 mg total) by mouth daily. Start the day after carboplatin chemotherapy for 3 days. (Patient not taking: Reported on 08/05/2019) 30 tablet 1  . nicotine (NICODERM CQ - DOSED IN MG/24 HOURS) 21 mg/24hr patch One patch transdermal chest wall daily (okay to substitute generic patch) (Patient taking differently: Place 21 mg onto the skin daily. ) 28 patch 0  . ondansetron (ZOFRAN) 8 MG tablet Take 1 tablet (8 mg total) by mouth 2 (two) times daily as needed for refractory nausea / vomiting. Start on day 3 after carboplatin chemo. (Patient not taking: Reported on 08/05/2019) 30 tablet 1  . polyethylene glycol (MIRALAX / GLYCOLAX) 17 g packet Take 17 g by mouth daily as needed for moderate constipation or severe constipation. (Patient taking differently: Take 17 g by mouth daily as needed for moderate constipation or severe constipation (constipation.). ) 14 each 0  . prochlorperazine (COMPAZINE) 10 MG tablet Take 1 tablet (10 mg total) by mouth every 6 (six) hours as needed (Nausea or vomiting). (Patient not taking: Reported on 08/05/2019) 30 tablet 1  . senna-docusate (SENOKOT-S) 8.6-50 MG tablet Take 2 tablets by mouth daily. 60 tablet 0    Results for orders placed or performed during the hospital encounter of 08/07/19 (from the past 48 hour(s))  Lipase, blood     Status: Abnormal   Collection Time: 08/07/19 11:15 AM  Result Value Ref Range   Lipase <10 (L) 11 - 51 U/L    Comment: Performed at [facility], [address] [place], [place] [address]  Comprehensive metabolic panel     Status: Abnormal   Collection Time: 08/07/19 11:15 AM  Result Value Ref Range   Sodium 133 (L) 135 - 145 mmol/L   Potassium 4.8 3.5 - 5.1 mmol/L   Chloride 90 (L) 98 - 111 mmol/L   CO2 23 22 - 32 mmol/L   Glucose, Bld 142 (H) 70 - 99 mg/dL   BUN 45 (H) 8 - 23 mg/dL   Creatinine, Ser 1.45 (H) 0.61 - 1.24 mg/dL   Calcium 9.1 8.9 - 10.3 mg/dL   Total Protein 7.4 6.5 - 8.1 g/dL   Albumin 3.7 3.5 -  5.0 g/dL   AST 32 15 - 41 U/L   ALT 18 0 - 44 U/L   Alkaline Phosphatase 143 (H) 38 - 126 U/L   Total Bilirubin 1.4 (H) 0.3 - 1.2 mg/dL   GFR calc non Af Amer 49 (L) >60 mL/min   GFR calc Af Amer 57 (L) >60 mL/min   Anion gap 20 (H) 5 - 15    Comment: Performed at [facility], [address] [place], [place] [address]  CBC     Status: Abnormal   Collection Time: 08/07/19 11:15 AM  Result Value Ref Range   WBC 18.8 (H) 4.0 - 10.5 K/uL   RBC 5.15 4.22 - 5.81 MIL/uL   Hemoglobin 14.9 13.0 - 17.0 g/dL   HCT 44.7 39.0 - 52.0 %   MCV 86.8 80.0 - 100.0 fL   MCH 28.9 26.0 - 34.0 pg   MCHC 33.3 30.0 - 36.0 g/dL   RDW 13.9 11.5 - 15.5 %   Platelets 226 150 - 400 K/uL   nRBC 0.0 0.0 - 0.2 %    Comment: Performed at [facility], [address] [place], [place] [address]  Lactic acid, plasma     Status: None   Collection Time: 08/07/19  2:54 PM  Result Value Ref Range   Lactic Acid, Venous 1.7 0.5 - 1.9 mmol/L    Comment: Performed at [facility], [address] [place], [place] [address]  Urinalysis, Complete w Microscopic     Status: Abnormal   Collection Time: 08/07/19  4:38 PM  Result Value Ref Range   Color, Urine AMBER (A) YELLOW    Comment: BIOCHEMICALS MAY BE AFFECTED BY COLOR   APPearance TURBID (A) CLEAR   Specific Gravity, Urine 1.036 (H) 1.005 - 1.030   pH 5.0 5.0 - 8.0   Glucose, UA NEGATIVE NEGATIVE mg/dL   Hgb urine dipstick MODERATE (A) NEGATIVE   Bilirubin Urine NEGATIVE NEGATIVE   Ketones, ur 20 (A) NEGATIVE mg/dL   Protein, ur 100 (A) NEGATIVE mg/dL   Nitrite POSITIVE (A) NEGATIVE   Leukocytes,Ua MODERATE (A) NEGATIVE   RBC / HPF >50 (H) 0 - 5 RBC/hpf   WBC, UA >50 (H) 0 - 5 WBC/hpf   Bacteria, UA MANY (A) NONE SEEN   Squamous Epithelial / LPF 0-5 0 - 5   WBC Clumps PRESENT    Mucus PRESENT     Comment: Performed at [facility], [address] [place], [place] [address]   Ct Abdomen Pelvis W Contrast  Result  Date: 08/07/2019 CLINICAL DATA:  Emesis for 2 days, history of bladder cancer EXAM: CT ABDOMEN AND PELVIS WITH CONTRAST TECHNIQUE: Multidetector CT  imaging of the abdomen and pelvis was performed using the standard protocol following bolus administration of intravenous contrast. CONTRAST:  184mL OMNIPAQUE IOHEXOL 300 MG/ML  SOLN COMPARISON:  PET-CT, 07/25/2019 FINDINGS: Lower chest: No acute abnormality. Metastatic nodules in the partially included lingula (series 4, image 9). Emphysema. Hepatobiliary: No solid liver abnormality is seen. No gallstones, gallbladder wall thickening, or biliary dilatation. Pancreas: Unremarkable. No pancreatic ductal dilatation or surrounding inflammatory changes. Spleen: Normal in size without significant abnormality. Adrenals/Urinary Tract: Adrenal glands are unremarkable. Right-sided percutaneous nephrostomy with formed pigtail in the right renal pelvis. No hydronephrosis. Redemonstrated, bulky, predominantly endoluminal bladder mass and soft tissue nodules. Stomach/Bowel: There is redemonstrated, extensive, bulky, metastatic tissue involving the omentum, multiple loops of small bowel, and the anterior abdominal wall. The stomach and proximal to mid small bowel are diffusely fluid distended, with an abrupt transition point in the central abdomen abutting metastatic soft tissue (series 2, image 59) and decompression of the distal small bowel. Vascular/Lymphatic: Aortic atherosclerosis. Multiple enlarged retroperitoneal, iliac, and pelvic sidewall lymph nodes as seen on prior examination. Reproductive: No mass or other significant abnormality. Other: No abdominal wall hernia or abnormality. No abdominopelvic ascites. Musculoskeletal: No acute or significant osseous findings. IMPRESSION: 1. There is redemonstrated, extensive, bulky, metastatic tissue involving the omentum, multiple loops of small bowel, and the anterior abdominal wall, not significant changed in appearance compared to  prior PET-CT examination dated 07/25/2019. The stomach and proximal to mid small bowel are diffusely fluid distended, with an abrupt transition point in the central abdomen abutting metastatic soft tissue (series 2, image 59) and decompression of the distal small bowel. Findings are consistent with small bowel obstruction. 2. Other findings of primary bladder malignancy and advanced metastatic disease as detailed above, not significantly changed in comparison to PET-CT dated 07/25/2019. 3. Aortic Atherosclerosis (ICD10-I70.0) and Emphysema (ICD10-J43.9). Electronically Signed   By: Eddie Candle M.D.   On: 08/07/2019 16:15   Dg Chest Portable 1 View  Result Date: 08/07/2019 CLINICAL DATA:  Elevated white count, emesis for 3 days. EXAM: PORTABLE CHEST 1 VIEW COMPARISON:  Chest radiograph dated 07/21/2019 FINDINGS: The heart size and mediastinal contours are within normal limits. Emphysematous changes are noted. Both lungs are clear. The visualized skeletal structures are unremarkable. A left subclavian approach central venous port catheter tip overlies the superior vena cava. A pigtail catheter overlies the right upper quadrant. IMPRESSION: No acute cardiopulmonary findings. Emphysema (ICD10-J43.9). Electronically Signed   By: Zerita Boers M.D.   On: 08/07/2019 15:17   Dg Abd Portable 1 View  Result Date: 08/07/2019 CLINICAL DATA:  NG tube placement EXAM: PORTABLE ABDOMEN - 1 VIEW COMPARISON:  CT abdomen/pelvis 08/07/2019 FINDINGS: Nasogastric tube with the tip projecting over the stomach. There is ill-defined dilatation of the small bowel consistent with bowel obstruction. There is no evidence of pneumoperitoneum, portal venous gas or pneumatosis. There are no pathologic calcifications along the expected course of the ureters. Right percutaneous nephrostomy tube. The osseous structures are unremarkable. IMPRESSION: 1. Nasogastric tube with the tip projecting over the stomach. Electronically Signed   By:  Kathreen Devoid   On: 08/07/2019 17:39    Review of Systems  Constitutional: Negative for chills and fever.  HENT: Negative for sore throat and tinnitus.   Eyes: Negative for blurred vision and redness.  Respiratory: Negative for cough and shortness of breath.   Cardiovascular: Negative for chest pain, palpitations, orthopnea and PND.  Gastrointestinal: Positive for nausea and vomiting. Negative for abdominal pain and diarrhea.  Genitourinary: Negative for dysuria, frequency and urgency.  Musculoskeletal: Negative for joint pain and myalgias.  Skin: Negative for rash.       No lesions  Neurological: Negative for speech change, focal weakness and weakness.  Endo/Heme/Allergies: Does not bruise/bleed easily.       No temperature intolerance  Psychiatric/Behavioral: Negative for depression and suicidal ideas.    Blood pressure (!) 145/79, pulse 92, temperature 98 F (36.7 C), temperature source Oral, resp. rate 16, SpO2 95 %. Physical Exam  Vitals reviewed. Constitutional: He is oriented to person, place, and time. He appears well-developed and well-nourished. No distress.  HENT:  Head: Normocephalic and atraumatic.  Mouth/Throat: Oropharynx is clear and moist.  Eyes: Pupils are equal, round, and reactive to light. Conjunctivae and EOM are normal. No scleral icterus.  Neck: Normal range of motion. Neck supple. No tracheal deviation present. No thyromegaly present.  Cardiovascular: Normal rate, regular rhythm and normal heart sounds. Exam reveals no gallop and no friction rub.  No murmur heard. Respiratory: Effort normal and breath sounds normal. No respiratory distress. He has no wheezes.  GI: Soft. Bowel sounds are normal. He exhibits distension. There is no abdominal tenderness.  Genitourinary:    Genitourinary Comments: Deferred   Musculoskeletal: Normal range of motion.        General: No edema.  Neurological: He is alert and oriented to person, place, and time. No cranial nerve  deficit.  Skin: Skin is warm and dry. No rash noted. No erythema.  Psychiatric: He has a normal mood and affect. His behavior is normal. Judgment and thought content normal.     Assessment/Plan This is a 68 year old male admitted for small bowel obstruction. 1.  Small bowel obstruction: In light of metastatic cancer he is an unlikely surgical candidate.  Surgery consult to follow.  Compazine as needed for nausea and vomiting refractory to Zofran 2.  Sepsis: The patient meets criteria via leukocytosis, intermittent tachycardia and tachypnea.  Stable.  Hydrate with intravenous fluid.  Follow blood and urine cultures for growth and sensitivities.  Source appears to be urine. 3.  Bladder cancer: Consult hematology oncology for possible palliative chemotherapy 4.  UTI:  Present on admission; continue cefepime and vancomycin 5.  Tobacco abuse: Continue nicotine patches 6.  DVT prophylaxis: Lovenox 7.  GI prophylaxis: Pantoprazole per home regimen The patient is a partial code.  He is a DO NOT INTUBATE.  Time spent on admission orders and patient care approximately 45 minutes  Harrie Foreman, MD 08/07/2019, 6:34 PM

## 2019-08-07 NOTE — ED Notes (Signed)
Pt has hx of bladder cancer, pt has not started chemotherapy yet, he is suppose to start Wednesday per patient.

## 2019-08-08 ENCOUNTER — Telehealth: Payer: Self-pay | Admitting: *Deleted

## 2019-08-08 DIAGNOSIS — A419 Sepsis, unspecified organism: Secondary | ICD-10-CM

## 2019-08-08 DIAGNOSIS — R112 Nausea with vomiting, unspecified: Secondary | ICD-10-CM | POA: Diagnosis not present

## 2019-08-08 DIAGNOSIS — E860 Dehydration: Secondary | ICD-10-CM

## 2019-08-08 DIAGNOSIS — C679 Malignant neoplasm of bladder, unspecified: Secondary | ICD-10-CM

## 2019-08-08 DIAGNOSIS — K56609 Unspecified intestinal obstruction, unspecified as to partial versus complete obstruction: Secondary | ICD-10-CM

## 2019-08-08 DIAGNOSIS — E43 Unspecified severe protein-calorie malnutrition: Secondary | ICD-10-CM

## 2019-08-08 DIAGNOSIS — N179 Acute kidney failure, unspecified: Secondary | ICD-10-CM

## 2019-08-08 LAB — BASIC METABOLIC PANEL
Anion gap: 16 — ABNORMAL HIGH (ref 5–15)
BUN: 43 mg/dL — ABNORMAL HIGH (ref 8–23)
CO2: 19 mmol/L — ABNORMAL LOW (ref 22–32)
Calcium: 7.9 mg/dL — ABNORMAL LOW (ref 8.9–10.3)
Chloride: 101 mmol/L (ref 98–111)
Creatinine, Ser: 1.15 mg/dL (ref 0.61–1.24)
GFR calc Af Amer: >60 mL/min (ref >60)
GFR calc non Af Amer: >60 mL/min (ref >60)
Glucose, Bld: 97 mg/dL (ref 70–99)
Potassium: 4.3 mmol/L (ref 3.5–5.1)
Sodium: 136 mmol/L (ref 135–145)

## 2019-08-08 LAB — CBC
HCT: 36.6 % — ABNORMAL LOW (ref 39.0–52.0)
Hemoglobin: 12 g/dL — ABNORMAL LOW (ref 13.0–17.0)
MCH: 29.3 pg (ref 26.0–34.0)
MCHC: 32.8 g/dL (ref 30.0–36.0)
MCV: 89.3 fL (ref 80.0–100.0)
Platelets: 153 10*3/uL (ref 150–400)
RBC: 4.1 MIL/uL — ABNORMAL LOW (ref 4.22–5.81)
RDW: 14.4 % (ref 11.5–15.5)
WBC: 12.1 10*3/uL — ABNORMAL HIGH (ref 4.0–10.5)
nRBC: 0 % (ref 0.0–0.2)

## 2019-08-08 LAB — SARS CORONAVIRUS 2 (TAT 6-24 HRS): SARS Coronavirus 2: NEGATIVE

## 2019-08-08 LAB — URINE CULTURE

## 2019-08-08 LAB — T4, FREE: Free T4: 0.98 ng/dL (ref 0.61–1.12)

## 2019-08-08 MED ORDER — SODIUM CHLORIDE 0.9 % IV SOLN
2.0000 g | Freq: Three times a day (TID) | INTRAVENOUS | Status: DC
  Filled 2019-08-08 (×2): qty 2

## 2019-08-08 MED ORDER — SODIUM CHLORIDE 0.9 % IV SOLN
2.0000 g | Freq: Three times a day (TID) | INTRAVENOUS | Status: DC
  Administered 2019-08-08 – 2019-08-09 (×4): 2 g via INTRAVENOUS
  Filled 2019-08-08 (×5): qty 2

## 2019-08-08 MED ORDER — METOPROLOL TARTRATE 5 MG/5ML IV SOLN
5.0000 mg | Freq: Four times a day (QID) | INTRAVENOUS | Status: DC
  Administered 2019-08-08 – 2019-08-12 (×16): 5 mg via INTRAVENOUS
  Filled 2019-08-08 (×18): qty 5

## 2019-08-08 MED ORDER — PANTOPRAZOLE SODIUM 40 MG IV SOLR
40.0000 mg | INTRAVENOUS | Status: DC
  Administered 2019-08-08 – 2019-08-11 (×4): 40 mg via INTRAVENOUS
  Filled 2019-08-08 (×5): qty 40

## 2019-08-08 MED ORDER — VANCOMYCIN HCL IN DEXTROSE 750-5 MG/150ML-% IV SOLN
750.0000 mg | Freq: Two times a day (BID) | INTRAVENOUS | Status: DC
  Administered 2019-08-08: 05:00:00 750 mg via INTRAVENOUS
  Filled 2019-08-08: qty 150

## 2019-08-08 MED ORDER — MORPHINE SULFATE (PF) 2 MG/ML IV SOLN
1.0000 mg | INTRAVENOUS | Status: DC | PRN
  Administered 2019-08-08: 2 mg via INTRAVENOUS
  Administered 2019-08-08 (×2): 1 mg via INTRAVENOUS
  Administered 2019-08-08: 17:00:00 2 mg via INTRAVENOUS
  Administered 2019-08-09 (×2): 1 mg via INTRAVENOUS
  Filled 2019-08-08 (×6): qty 1

## 2019-08-08 NOTE — Progress Notes (Signed)
[place] Hospital Day(s): 1.   Interval History: Patient seen and examined, no acute events or new complaints overnight. Patient reports he is feeling much better this morning. His abdominal distension has improved some and he reports less pain. No fever, chills, nausea, or emesis. He does report passing some gas. NGT output since placement about 400 ccs of feculent material. No other complaints.    Vital signs in last 24 hours: [min-max] current  Temp:  [97.8 F (36.6 C)-98 F (36.7 C)] 97.8 F (36.6 C) (10/26 0439) Pulse Rate:  [88-116] 93 (10/26 0439) Resp:  [16-25] 17 (10/26 0439) BP: (126-160)/(65-90) 156/69 (10/26 0439) SpO2:  [93 %-98 %] 96 % (10/26 0439) Weight:  [70 kg] 70 kg (10/26 0654)       Weight: 70 kg BMI (Calculated): 20.36   Intake/Output last 2 shifts:  10/25 0701 - 10/26 0700 In: 2371.7 [I.V.:1021.7; IV Piggyback:1350] Out: 800 [Urine:400; Emesis/NG output:400]   Physical Exam:  Constitutional: alert, cooperative and no distress  HENT: normocephalic without obvious abnormality, NGT in place draining feculent material.   Eyes: PERRL, EOM's grossly intact and symmetric  Respiratory: breathing non-labored at rest  Chest: Port in left upper chest, incision healing well with dermabond Cardiovascular: regular rate and sinus rhythm  Gastrointestinal: soft, non-tender, still with mild distension. There is a palpable firm mass in the supraumbilical region. No rebound/guarding   Labs:  CBC Latest Ref Rng & Units 08/08/2019 08/07/2019 07/26/2019  WBC 4.0 - 10.5 K/uL 12.1(H) 18.8(H) 7.4  Hemoglobin 13.0 - 17.0 g/dL 12.0(L) 14.9 13.4  Hematocrit 39.0 - 52.0 % 36.6(L) 44.7 40.2  Platelets 150 - 400 K/uL 153 226 183   CMP Latest Ref Rng & Units 08/08/2019 08/07/2019 07/26/2019  Glucose 70 - 99 mg/dL 97 142(H) 109(H)  BUN 8 - 23 mg/dL 43(H) 45(H) 21  Creatinine 0.61 - 1.24 mg/dL 1.15 1.45(H) 0.98  Sodium 135 - 145 mmol/L 136  133(L) 135  Potassium 3.5 - 5.1 mmol/L 4.3 4.8 3.9  Chloride 98 - 111 mmol/L 101 90(L) 99  CO2 22 - 32 mmol/L 19(L) 23 25  Calcium 8.9 - 10.3 mg/dL 7.9(L) 9.1 8.6(L)  Total Protein 6.5 - 8.1 g/dL - 7.4 6.7  Total Bilirubin 0.3 - 1.2 mg/dL - 1.4(H) 0.7  Alkaline Phos 38 - 126 U/L - 143(H) 127(H)  AST 15 - 41 U/L - 32 25  ALT 0 - 44 U/L - 18 13     Imaging studies: No new pertinent imaging studies   Assessment/Plan: 68 y.o. male with small bowel obstruction related to peritoneal carcinomatosiscomplicated by pertinent comorbidities including history of metastatic bladder cancer.   - Unfortunately this is a very difficult situation. We will attempt to manage this with conservative measures. However, if this fails surgical options are very limited given the degree of tumor burden and with the carcinomatosis we may encounter a frozen abdomen.    - Recommend palliative care / oncology consults secondary to above.     - NPO   - Continue NGT decompression; monitor output  - Serial abdominal examination +/- KUBs  - pain control prn  - mobilization encouraged   All of the above findings and recommendations were discussed with the patient, patient's family (daughter - at bedside), and the medical team, and all of patient's and family's questions were answered to their expressed satisfaction.  -- Edison Simon, PA-C [facility] 08/08/2019, 10:44 AM [phone] M-F: 7am - 4pm

## 2019-08-08 NOTE — Progress Notes (Signed)
Received verbal order from Dr. Sidney Ace for morphine 1-2mg  from 2-4mg  change for pain prn.

## 2019-08-08 NOTE — Progress Notes (Signed)
Pharmacy Antibiotic Note  NUH MEEDS is a 68 y.o. male admitted on 08/07/2019 with sepsis s/t intra-abdominal source.  Pharmacy has been consulted for vanc/cefepime dosing.  Plan: Patient received vanc 1.75g IV load   Vancomycin 750 mg IV Q 12 hrs. Goal AUC 400-550. Expected AUC: 477.6 SCr used: 1.15 Cssmin: 14.2  Will start cefepime 2g IV q8h per CrCl > 60 ml/min and will continue to monitor.     Temp (24hrs), Avg:97.9 F (36.6 C), Min:97.8 F (36.6 C), Max:98 F (36.7 C)  Recent Labs  Lab 08/07/19 1115 08/07/19 1454 08/08/19 0209  WBC 18.8*  --   --   CREATININE 1.45*  --  1.15  LATICACIDVEN  --  1.7  --     Estimated Creatinine Clearance: 61.2 mL/min (by C-G formula based on SCr of 1.15 mg/dL).    No Known Allergies   Thank you for allowing pharmacy to be a part of this patient's care.  Tobie Lords, PharmD, BCPS Clinical Pharmacist 08/08/2019 4:41 AM

## 2019-08-08 NOTE — Progress Notes (Addendum)
[facility] NAME: Zaveon Nolton    MR#:  [id number]  DATE OF BIRTH:  [birth year]  SUBJECTIVE:   Patient states he is feeling much better today. He has not had any additional episodes of vomiting. His abdominal pain and distention has improved. He is passing gas, but has not had a BM since Saturday.  REVIEW OF SYSTEMS:  Review of Systems  Constitutional: Negative for chills and fever.  HENT: Negative for congestion and sore throat.   Eyes: Negative for blurred vision and double vision.  Respiratory: Negative for cough and shortness of breath.   Cardiovascular: Negative for chest pain and palpitations.  Gastrointestinal: Positive for abdominal pain and constipation. Negative for diarrhea, nausea and vomiting.  Genitourinary: Negative for dysuria and urgency.  Musculoskeletal: Negative for back pain and neck pain.  Neurological: Negative for dizziness and headaches.  Psychiatric/Behavioral: Negative for depression. The patient is not nervous/anxious.    DRUG ALLERGIES:  No Known Allergies VITALS:  Blood pressure 133/71, pulse 91, temperature 97.8 F (36.6 C), temperature source Oral, resp. rate 17, weight 70 kg, SpO2 96 %. PHYSICAL EXAMINATION:  Physical Exam  GENERAL:  Laying in the bed with no acute distress.  HEENT: Head atraumatic, normocephalic. Pupils equal, round, reactive to light and accommodation. No scleral icterus. Extraocular muscles intact. NG tube in place. NECK:  Supple, no jugular venous distention. No thyroid enlargement. LUNGS: Lungs are clear to auscultation bilaterally. No wheezes, crackles, rhonchi. No use of accessory muscles of respiration.  CARDIOVASCULAR: RRR, S1, S2 normal. No murmurs, rubs, or gallops.  ABDOMEN: Soft.  +mild distention, +mild diffuse tenderness to palpation. No rebound or guarding. Hypoactive bowel sounds. EXTREMITIES: No pedal edema, cyanosis, or clubbing.  NEUROLOGIC: CN 2-12 intact,  no focal deficits. 5/5 muscle strength throughout all extremities. Sensation intact throughout. Gait not checked.  PSYCHIATRIC: The patient is alert and oriented x 3.  SKIN: No obvious rash, lesion, or ulcer.  LABORATORY PANEL:  Male CBC Recent Labs  Lab 08/08/19 0757  WBC 12.1*  HGB 12.0*  HCT 36.6*  PLT 153   ------------------------------------------------------------------------------------------------------------------ Chemistries  Recent Labs  Lab 08/07/19 1115 08/08/19 0209  NA 133* 136  K 4.8 4.3  CL 90* 101  CO2 23 19*  GLUCOSE 142* 97  BUN 45* 43*  CREATININE 1.45* 1.15  CALCIUM 9.1 7.9*  AST 32  --   ALT 18  --   ALKPHOS 143*  --   BILITOT 1.4*  --    RADIOLOGY:  Ct Abdomen Pelvis W Contrast  Result Date: 08/07/2019 CLINICAL DATA:  Emesis for 2 days, history of bladder cancer EXAM: CT ABDOMEN AND PELVIS WITH CONTRAST TECHNIQUE: Multidetector CT imaging of the abdomen and pelvis was performed using the standard protocol following bolus administration of intravenous contrast. CONTRAST:  1110mL OMNIPAQUE IOHEXOL 300 MG/ML  SOLN COMPARISON:  PET-CT, 07/25/2019 FINDINGS: Lower chest: No acute abnormality. Metastatic nodules in the partially included lingula (series 4, image 9). Emphysema. Hepatobiliary: No solid liver abnormality is seen. No gallstones, gallbladder wall thickening, or biliary dilatation. Pancreas: Unremarkable. No pancreatic ductal dilatation or surrounding inflammatory changes. Spleen: Normal in size without significant abnormality. Adrenals/Urinary Tract: Adrenal glands are unremarkable. Right-sided percutaneous nephrostomy with formed pigtail in the right renal pelvis. No hydronephrosis. Redemonstrated, bulky, predominantly endoluminal bladder mass and soft tissue nodules. Stomach/Bowel: There is redemonstrated, extensive, bulky, metastatic tissue involving the omentum, multiple loops of small bowel, and the anterior abdominal wall. The stomach  and  proximal to mid small bowel are diffusely fluid distended, with an abrupt transition point in the central abdomen abutting metastatic soft tissue (series 2, image 59) and decompression of the distal small bowel. Vascular/Lymphatic: Aortic atherosclerosis. Multiple enlarged retroperitoneal, iliac, and pelvic sidewall lymph nodes as seen on prior examination. Reproductive: No mass or other significant abnormality. Other: No abdominal wall hernia or abnormality. No abdominopelvic ascites. Musculoskeletal: No acute or significant osseous findings. IMPRESSION: 1. There is redemonstrated, extensive, bulky, metastatic tissue involving the omentum, multiple loops of small bowel, and the anterior abdominal wall, not significant changed in appearance compared to prior PET-CT examination dated 07/25/2019. The stomach and proximal to mid small bowel are diffusely fluid distended, with an abrupt transition point in the central abdomen abutting metastatic soft tissue (series 2, image 59) and decompression of the distal small bowel. Findings are consistent with small bowel obstruction. 2. Other findings of primary bladder malignancy and advanced metastatic disease as detailed above, not significantly changed in comparison to PET-CT dated 07/25/2019. 3. Aortic Atherosclerosis (ICD10-I70.0) and Emphysema (ICD10-J43.9). Electronically Signed   By: Eddie Candle M.D.   On: 08/07/2019 16:15   Dg Chest Portable 1 View  Result Date: 08/07/2019 CLINICAL DATA:  Elevated white count, emesis for 3 days. EXAM: PORTABLE CHEST 1 VIEW COMPARISON:  Chest radiograph dated 07/21/2019 FINDINGS: The heart size and mediastinal contours are within normal limits. Emphysematous changes are noted. Both lungs are clear. The visualized skeletal structures are unremarkable. A left subclavian approach central venous port catheter tip overlies the superior vena cava. A pigtail catheter overlies the right upper quadrant. IMPRESSION: No acute cardiopulmonary  findings. Emphysema (ICD10-J43.9). Electronically Signed   By: Zerita Boers M.D.   On: 08/07/2019 15:17   Dg Abd Portable 1 View  Result Date: 08/07/2019 CLINICAL DATA:  NG tube placement EXAM: PORTABLE ABDOMEN - 1 VIEW COMPARISON:  CT abdomen/pelvis 08/07/2019 FINDINGS: Nasogastric tube with the tip projecting over the stomach. There is ill-defined dilatation of the small bowel consistent with bowel obstruction. There is no evidence of pneumoperitoneum, portal venous gas or pneumatosis. There are no pathologic calcifications along the expected course of the ureters. Right percutaneous nephrostomy tube. The osseous structures are unremarkable. IMPRESSION: 1. Nasogastric tube with the tip projecting over the stomach. Electronically Signed   By: Kathreen Devoid   On: 08/07/2019 17:39   ASSESSMENT AND PLAN:   Small bowel obstruction due to metastatic bladder cancer and peritoneal carcinomatosis- symptoms have improved with NG tube -Continue NG tube for today -NPO -Continue IVFs -Surgery following -Pain control and IV anti-emetics -Palliative care consult  Sepsis secondary to UTI- meeting sepsis criteria on admission with leukocytosis, tachycardia, and tachypnea.  Sepsis is improving. -Nephrostomy tube in place -Blood cultures with no growth to date -Urine culture pending -Stop vancomycin and continue cefepime  Metastatic bladder cancer with peritoneal carcinomatosis with nephrostomy tube in place -Oncology consulted  Tobacco use -Continue nicotine patch  Daughter, [name], updated via the phone.  All the records are reviewed and case discussed with Care Management/Social Worker. Management plans discussed with the patient, family and they are in agreement.  CODE STATUS: Partial Code  TOTAL TIME TAKING CARE OF THIS PATIENT: 40 minutes.   More than 50% of the time was spent in counseling/coordination of care: YES  POSSIBLE D/C IN 1-2 DAYS, DEPENDING ON CLINICAL CONDITION.   Berna Spare  Aadhya Bustamante M.D on 08/08/2019 at 12:08 PM  Between 7am to 6pm - Pager - [phone]  After 6pm  go to [email or web address] - Technical [employer]wer [facility]  Office  [phone]  CC: Primary care physician; Earlie Server, MD  Note: This dictation was prepared with Dragon dictation along with smaller phrase technology. Any transcriptional errors that result from this process are unintentional.

## 2019-08-08 NOTE — Progress Notes (Signed)
Initial Nutrition Assessment  DOCUMENTATION CODES:   Severe malnutrition in context of chronic illness  INTERVENTION:  Will monitor for plan of care and recommend nutrition interventions as warranted.  NUTRITION DIAGNOSIS:   Severe Malnutrition related to chronic illness(metastatic bladder cancer) as evidenced by severe fat depletion, severe muscle depletion.  GOAL:   Patient will meet greater than or equal to 90% of their needs  MONITOR:   Diet advancement, Labs, Weight trends, Skin, I & O's  REASON FOR ASSESSMENT:   Malnutrition Screening Tool    ASSESSMENT:   68 year old male with PMHx of metastatic bladder cancer and peritoneal carcinomatosis admitted with SBO, sepsis secondary to UTI.   Met with patient and his daughter at bedside. He reports he had a poor appetite for 3 days PTA and was not able to eat or drink much. Prior to that he still had a decreased appetite for a while and was eating less than usual. Patient reports he is feeling better today. He reports his nausea has improved and he had flatus today.   Patient reports his UBW was 174 lbs. Limited weight history in chart so unable to trend. He is now 70 kg (154.32 lbs).  Medications reviewed and include: Colace 100 mg BID, nicotine patch, pantoprazole, senna-docusate 2 tablets daily, NS at 125 mL/hr, cefepime.  Labs reviewed: CO2 19, BUN 43, Anion gap 16.  NUTRITION - FOCUSED PHYSICAL EXAM:    Most Recent Value  Orbital Region  Severe depletion  Upper Arm Region  Severe depletion  Thoracic and Lumbar Region  Moderate depletion  Buccal Region  Severe depletion  Temple Region  Severe depletion  Clavicle Bone Region  Severe depletion  Clavicle and Acromion Bone Region  Severe depletion  Scapular Bone Region  Severe depletion  Dorsal Hand  Moderate depletion  Patellar Region  Severe depletion  Anterior Thigh Region  Severe depletion  Posterior Calf Region  Moderate depletion  Edema (RD Assessment)  None   Hair  Reviewed  Eyes  Reviewed  Mouth  Reviewed  Skin  Reviewed  Nails  Reviewed     Diet Order:   Diet Order            Diet NPO time specified  Diet effective now             EDUCATION NEEDS:   No education needs have been identified at this time  Skin:  Skin Assessment: Skin Integrity Issues:(closed incision to chest)  Last BM:  08/06/2019 per chart  Height:   Ht Readings from Last 1 Encounters:  08/02/19 _0  (1.854 m)   Weight:   Wt Readings from Last 1 Encounters:  08/08/19 70 kg   Ideal Body Weight:  83.6 kg  BMI:  Body mass index is 20.36 kg/m.  Estimated Nutritional Needs:   Kcal:  2000-2200  Protein:  100-110 grams  Fluid:  2-2.2 L/day  Willey Blade, MS, RD, LDN Office: [phone] Pager: [phone] After Hours/Weekend Pager: [phone]

## 2019-08-08 NOTE — Consult Note (Signed)
Hematology/Oncology Consult note [facility] Telephone:([phone] [phone]) [phone]  Patient Care Team: Earlie Server, MD as PCP - General (Oncology)   Name of the patient: Johnny Martin  [id number]  1951/06/23   Date of visit: 08/08/19 REASON FOR COSULTATION:  Metastatic bladder cancer History of presenting illness-  68 y.o. male with PMH listed at below who presents to ER for evaluation of 2 days of nausea and vomiting.  Not able to keep food down. Patient had CT abdomen done in emergency room which showed small bowel obstruction.  NG tube was placed in ED and patient was admitted for treatment of SBO. Surgery was consulted. CT abdomen pelvis with contrast showed extensive, bulky, metastatic tissue involving the omentum, multiple loops of small bowel, the anterior abdominal wall, not significant change at in appearance compared to prior PET scan. Stomach and proximal to mid small bowel are diffusely fluid distended, with an abrupt transition point in the central abdomen abutting metastatic soft tissue and decompression of the distal small bowel.  Consistent with small bowel obstruction.  No intervention per surgery.  Supportive care.  SBO was thought to be secondary to metastatic bladder cancer with peritoneal carcinomatosis.  Today patient reports much better after NG tube decompression.  Denies any pain.  Feels weak.   Review of Systems  Constitutional: Positive for appetite change, fatigue and unexpected weight change. Negative for chills and fever.  HENT:   Negative for hearing loss and voice change.   Eyes: Negative for eye problems and icterus.  Respiratory: Negative for chest tightness, cough and shortness of breath.   Cardiovascular: Negative for chest pain and leg swelling.  Gastrointestinal: Positive for nausea and vomiting. Negative for abdominal distention and abdominal pain.  Endocrine: Negative for hot flashes.  Genitourinary: Negative for  difficulty urinating, dysuria and frequency.   Musculoskeletal: Negative for arthralgias.  Skin: Negative for itching and rash.  Neurological: Negative for light-headedness and numbness.  Hematological: Negative for adenopathy. Does not bruise/bleed easily.  Psychiatric/Behavioral: Negative for confusion.    No Known Allergies  Patient Active Problem List   Diagnosis Date Noted   Protein-calorie malnutrition, severe 08/08/2019   SBO (small bowel obstruction) ([place]) 08/07/2019   Goals of care, counseling/discussion 07/21/2019   Bladder cancer ([place]) 07/21/2019   Drug-induced constipation    Bladder mass 07/11/2019   Hydronephrosis due to obstruction of ureter 07/11/2019   Gross hematuria    Peritoneal carcinomatosis ([facility])    Mass of left side of neck    AKI (acute kidney injury) ([place])      Past Medical History:  Diagnosis Date   Bladder cancer ([place]) 07/21/2019   Cancer ([place])    Bladder cancer     Past Surgical History:  Procedure Laterality Date   BLADDER SURGERY     BRAIN SURGERY     plate in head per patient since childhood    PORTACATH PLACEMENT Left 07/21/2019   Procedure: INSERTION PORT-A-CATH;  Surgeon: Olean Ree, MD;  Location: [facility] ORS;  Service: General;  Laterality: Left;    Social History   Socioeconomic History   Marital status: Divorced    Spouse name: Not on file   Number of children: Not on file   Years of education: Not on file   Highest education level: Not on file  Occupational History   Not on file  Social Needs   Financial resource strain: Not on file   Food insecurity    Worry: Not on file  Inability: Not on file   Transportation needs    Medical: Not on file    Non-medical: Not on file  Tobacco Use   Smoking status: Former Smoker    Packs/day: 0.50    Quit date: 07/13/2019    Years since quitting: 0.0   Smokeless tobacco: Never Used  Substance and Sexual Activity   Alcohol use: Not Currently     Frequency: Never    Comment: quit 3 years ago    Drug use: Never   Sexual activity: Not on file  Lifestyle   Physical activity    Days per week: Not on file    Minutes per session: Not on file   Stress: Not on file  Relationships   Social connections    Talks on phone: Not on file    Gets together: Not on file    Attends religious service: Not on file    Active member of club or organization: Not on file    Attends meetings of clubs or organizations: Not on file    Relationship status: Not on file   Intimate partner violence    Fear of current or ex partner: Not on file    Emotionally abused: Not on file    Physically abused: Not on file    Forced sexual activity: Not on file  Other Topics Concern   Not on file  Social History Narrative   Lives at home with daughter and son in [place].     Family History  Problem Relation Age of Onset   Cervical cancer Sister    Kidney failure Mother    Stroke Mother    Alzheimer's disease Father      Current Facility-Administered Medications:    0.9 %  sodium chloride infusion, , Intravenous, Continuous, Harrie Foreman, MD, Last Rate: 125 mL/hr at 08/08/19 1500   acetaminophen (TYLENOL) tablet 650 mg, 650 mg, Oral, Q6H PRN **OR** acetaminophen (TYLENOL) suppository 650 mg, 650 mg, Rectal, Q6H PRN, Harrie Foreman, MD   ceFEPIme (MAXIPIME) 2 g in sodium chloride 0.9 % 100 mL IVPB, 2 g, Intravenous, Q8H, Mayo, Pete Pelt, MD, Stopped at 08/08/19 1438   Chlorhexidine Gluconate Cloth 2 % PADS 6 each, 6 each, Topical, Daily, Harrie Foreman, MD, 6 each at 08/07/19 2107   docusate sodium (COLACE) capsule 100 mg, 100 mg, Oral, BID, Harrie Foreman, MD   enoxaparin (LOVENOX) injection 40 mg, 40 mg, Subcutaneous, Q24H, Harrie Foreman, MD, 40 mg at 08/08/19 2120   metoprolol tartrate (LOPRESSOR) injection 5 mg, 5 mg, Intravenous, Q6H, Mayo, Pete Pelt, MD, 5 mg at 08/08/19 1742   morphine 2 MG/ML injection 1-2 mg, 1-2  mg, Intravenous, Q4H PRN, Mansy, Jan A, MD, 1 mg at 08/08/19 2115   nicotine (NICODERM CQ - dosed in mg/24 hours) patch 21 mg, 21 mg, Transdermal, Daily, Harrie Foreman, MD, 21 mg at 08/08/19 2120   ondansetron (ZOFRAN) tablet 4 mg, 4 mg, Oral, Q6H PRN **OR** ondansetron (ZOFRAN) injection 4 mg, 4 mg, Intravenous, Q6H PRN, Harrie Foreman, MD   pantoprazole (PROTONIX) injection 40 mg, 40 mg, Intravenous, Q24H, Mayo, Pete Pelt, MD, 40 mg at 08/08/19 1055   polyethylene glycol (MIRALAX / GLYCOLAX) packet 17 g, 17 g, Oral, Daily PRN, Harrie Foreman, MD   prochlorperazine (COMPAZINE) injection 10 mg, 10 mg, Intravenous, Q6H PRN, Harrie Foreman, MD   senna-docusate (Senokot-S) tablet 2 tablet, 2 tablet, Oral, Daily, Harrie Foreman, MD   Physical  exam:  Vitals:   08/08/19 1044 08/08/19 1347 08/08/19 1738 08/08/19 1927  BP: 133/71 119/63 137/76 136/77  Pulse: 91 89 96 84  Resp:  17    Temp:  98.2 F (36.8 C)  97.6 F (36.4 C)  TempSrc:    Oral  SpO2:  94%  98%  Weight:       Physical Exam  Constitutional: He is oriented to person, place, and time. No distress.  HENT:  Head: Normocephalic and atraumatic.  Mouth/Throat: No oropharyngeal exudate.  Eyes: Pupils are equal, round, and reactive to light. EOM are normal. No scleral icterus.  Neck: Normal range of motion. Neck supple.  NG tube in place.   Cardiovascular: Normal rate and regular rhythm.  No murmur heard. Pulmonary/Chest: Effort normal. No respiratory distress.  Abdominal: Soft. He exhibits distension. There is no abdominal tenderness.  Diminished bowel sounds  Musculoskeletal: Normal range of motion.        General: No edema.  Neurological: He is alert and oriented to person, place, and time. No cranial nerve deficit. He exhibits normal muscle tone. Coordination normal.  Skin: He is not diaphoretic. No erythema.  Psychiatric: Affect normal.        CMP Latest Ref Rng & Units 08/08/2019  Glucose 70  - 99 mg/dL 97  BUN 8 - 23 mg/dL 43(H)  Creatinine 0.61 - 1.24 mg/dL 1.15  Sodium 135 - 145 mmol/L 136  Potassium 3.5 - 5.1 mmol/L 4.3  Chloride 98 - 111 mmol/L 101  CO2 22 - 32 mmol/L 19(L)  Calcium 8.9 - 10.3 mg/dL 7.9(L)  Total Protein 6.5 - 8.1 g/dL -  Total Bilirubin 0.3 - 1.2 mg/dL -  Alkaline Phos 38 - 126 U/L -  AST 15 - 41 U/L -  ALT 0 - 44 U/L -   CBC Latest Ref Rng & Units 08/08/2019  WBC 4.0 - 10.5 K/uL 12.1(H)  Hemoglobin 13.0 - 17.0 g/dL 12.0(L)  Hematocrit 39.0 - 52.0 % 36.6(L)  Platelets 150 - 400 K/uL 153   RADIOGRAPHIC STUDIES: I have personally reviewed the radiological images as listed and agreed with the findings in the report.  Ct Abdomen Wo Contrast  Result Date: 07/12/2019 CLINICAL DATA:  Right hydronephrosis. EXAM: CT ABDOMEN WITHOUT CONTRAST TECHNIQUE: Multidetector CT imaging of the abdomen was performed following the standard protocol without IV contrast. COMPARISON:  Prior recent CT. FINDINGS: Limited CT was obtained in order to determine access for right percutaneous nephrostomy. Right hydronephrosis again identified. Reference is made to percutaneous nephrostomy report. IMPRESSION: Limited CT was obtained in order to determine access for right percutaneous nephrostomy. Reference is made to percutaneous nephrostomy report. Electronically Signed   By: Marcello Moores  Register   On: 07/12/2019 12:20   Ct Guided Needle Placement  Result Date: 07/13/2019 INDICATION: History of bladder cancer, now with peritoneal masses worrisome for metastatic disease. Please perform CT-guided biopsy for tissue diagnostic purposes. EXAM: CT GUIDANCE NEEDLE PLACEMENT COMPARISON:  CT abdomen and pelvis - 07/11/2019 MEDICATIONS: None. ANESTHESIA/SEDATION: Fentanyl 100 mcg IV; Versed 2 mg IV Sedation time: 10 minutes; The patient was continuously monitored during the procedure by the interventional radiology nurse under my direct supervision. CONTRAST:  None. COMPLICATIONS: None immediate.  PROCEDURE: Informed consent was obtained from the patient following an explanation of the procedure, risks, benefits and alternatives. A time out was performed prior to the initiation of the procedure. The patient was positioned supine on the CT table and a limited CT was performed for procedural planning demonstrating  unchanged size and appearance of the infiltrative mass involving the left pericolic gutter with dominant component measuring at least 12.3 x 6.2 cm (image 26, series 2). The procedure was planned. The operative site was prepped and draped in the usual sterile fashion. Appropriate trajectory was confirmed with a 22 gauge spinal needle after the adjacent tissues were anesthetized with 1% Lidocaine with epinephrine. Under intermittent CT guidance, a 17 gauge coaxial needle was advanced into the peripheral aspect of the mass. Appropriate positioning was confirmed and 5 core needle biopsy samples were obtained with an 18 gauge core needle biopsy device. The co-axial needle was removed following the administration of a Gel-Foam slurry and superficial hemostasis was achieved with manual compression. A limited postprocedural CT was negative for hemorrhage or additional complication. A dressing was placed. The patient tolerated the procedure well without immediate postprocedural complication. IMPRESSION: Technically successful CT guided core needle biopsy of infiltrative mass within the left pericolic gutter. Electronically Signed   By: Sandi Mariscal M.D.   On: 07/13/2019 11:35   Ct Abdomen Pelvis W Contrast  Result Date: 08/07/2019 CLINICAL DATA:  Emesis for 2 days, history of bladder cancer EXAM: CT ABDOMEN AND PELVIS WITH CONTRAST TECHNIQUE: Multidetector CT imaging of the abdomen and pelvis was performed using the standard protocol following bolus administration of intravenous contrast. CONTRAST:  113m OMNIPAQUE IOHEXOL 300 MG/ML  SOLN COMPARISON:  PET-CT, 07/25/2019 FINDINGS: Lower chest: No acute  abnormality. Metastatic nodules in the partially included lingula (series 4, image 9). Emphysema. Hepatobiliary: No solid liver abnormality is seen. No gallstones, gallbladder wall thickening, or biliary dilatation. Pancreas: Unremarkable. No pancreatic ductal dilatation or surrounding inflammatory changes. Spleen: Normal in size without significant abnormality. Adrenals/Urinary Tract: Adrenal glands are unremarkable. Right-sided percutaneous nephrostomy with formed pigtail in the right renal pelvis. No hydronephrosis. Redemonstrated, bulky, predominantly endoluminal bladder mass and soft tissue nodules. Stomach/Bowel: There is redemonstrated, extensive, bulky, metastatic tissue involving the omentum, multiple loops of small bowel, and the anterior abdominal wall. The stomach and proximal to mid small bowel are diffusely fluid distended, with an abrupt transition point in the central abdomen abutting metastatic soft tissue (series 2, image 59) and decompression of the distal small bowel. Vascular/Lymphatic: Aortic atherosclerosis. Multiple enlarged retroperitoneal, iliac, and pelvic sidewall lymph nodes as seen on prior examination. Reproductive: No mass or other significant abnormality. Other: No abdominal wall hernia or abnormality. No abdominopelvic ascites. Musculoskeletal: No acute or significant osseous findings. IMPRESSION: 1. There is redemonstrated, extensive, bulky, metastatic tissue involving the omentum, multiple loops of small bowel, and the anterior abdominal wall, not significant changed in appearance compared to prior PET-CT examination dated 07/25/2019. The stomach and proximal to mid small bowel are diffusely fluid distended, with an abrupt transition point in the central abdomen abutting metastatic soft tissue (series 2, image 59) and decompression of the distal small bowel. Findings are consistent with small bowel obstruction. 2. Other findings of primary bladder malignancy and advanced  metastatic disease as detailed above, not significantly changed in comparison to PET-CT dated 07/25/2019. 3. Aortic Atherosclerosis (ICD10-I70.0) and Emphysema (ICD10-J43.9). Electronically Signed   By: AEddie CandleM.D.   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 EXAM: NUCLEAR MEDICINE PET SKULL BASE TO THIGH TECHNIQUE: 8.795 mCi F-18 FDG was injected intravenously. Full-ring PET imaging was performed from the skull base to thigh after the radiotracer. CT data was obtained and  used for attenuation correction and anatomic localization. Fasting blood glucose: 93 mg/dl COMPARISON:  None. FINDINGS: Mediastinal blood pool activity: SUV max 1.36 . Liver activity: SUV max NA NECK: Enlarged and hypermetabolic left level 2 lymph nodes. Nodal mass measures 3.4 by 1.9 cm and has an SUV max of 7.06 Incidental CT findings: none CHEST: Hypermetabolic right paratracheal, subcarinal and bilateral hilar lymph nodes are identified. Right paratracheal node measures 1.4 cm and has an SUV max of 10.38. Index subcarinal node measures 1.1 cm and has an SUV max of 8.93. Index left hilar node measures 1.2 cm and has an SUV max of 6.41. Right hilar lymph node measures 1.2 cm and has an SUV max 8.66. Right CP angle lymph node is FDG avid measuring 1.4 cm with SUV max of 7.57. Bilateral FDG avid pulmonary nodules. Right lower lobe lung nodule measures 1 cm and has an SUV max of 1.88. Hypermetabolic subpleural nodule in the lingula measures 1 cm and has an SUV max of 6.6. Incidental CT findings: Advanced changes of emphysema. Aortic atherosclerosis. ABDOMEN/PELVIS: There is a few small foci of FDG uptake above liver background activity without definite CT correlate, equivocal for metastasis. For example, within segment 8 there is an area of increased uptake within SUV max of 2.67. This is compared with  background liver activity of 2.15. No abnormal uptake within the pancreas, spleen or adrenal glands. Soft tissue mass involving the bladder measures 9 cm within SUV max of 15.7. Hypermetabolic right retrocrural lymph node measures 1 cm and has an SUV max of 7.13. Multiple hypermetabolic retroperitoneal and bilateral iliac and inguinal lymph nodes identified. Index left periaortic node measures 1.1 cm within SUV max of 8.01. Index retrocaval lymph node measures 1.3 cm and has an SUV max of 6.25. Left common iliac node measures 1.3 cm and has an SUV max of 1.2. Right common iliac node measures 1.5 cm and has an SUV max of 6.4. Left external iliac node measures 1.8 cm with SUV max of 8.79. Left inguinal node measures 0.7 cm and has an SUV max of 4.87. Left pericolic gutter soft tissue mass measures 12.4 cm and has SUV max of 11.9. Right lateral abdominal mass measures 8 cm and has SUV max of 12.07. Large mass within the ventral abdomen measures 16.6 cm within SUV max of 11.36. Small FDG avid peritoneal implants are identified along the surface of the liver. Incidental CT findings: Right-sided percutaneous nephrostomy tube is in place. No hydronephrosis identified bilaterally. Aortic atherosclerosis. SKELETON: No focal hypermetabolic activity to suggest skeletal metastasis. Incidental CT findings: none IMPRESSION: 1. Large mass within the urinary bladder is identified and is hypermetabolic concerning for primary urothelial neoplasm. 2. Hypermetabolic left cervical, thoracic, abdominal, and pelvic lymph nodes consistent with metastatic adenopathy. 3. Extensive, bulky, hypermetabolic peritoneal disease within the abdomen and pelvis. 4. Bilateral pulmonary nodules concerning for metastatic disease. 5. A few small foci (without CT correlate) of mild FDG uptake are noted in the liver which are equivocal for metastasis. 6. No findings to suggest osseous metastasis. 7. Aortic Atherosclerosis (ICD10-I70.0) and Emphysema  (ICD10-J43.9). Electronically Signed   By: Kerby Moors M.D.   On: 07/25/2019 13:30   Dg Chest Portable 1 View  Result Date: 08/07/2019 CLINICAL DATA:  Elevated white count, emesis for 3 days. EXAM: PORTABLE CHEST 1 VIEW COMPARISON:  Chest radiograph dated 07/21/2019 FINDINGS: The heart size and mediastinal contours are within normal limits. Emphysematous changes are noted. Both lungs are clear. The visualized skeletal structures  are unremarkable. A left subclavian approach central venous port catheter tip overlies the superior vena cava. A pigtail catheter overlies the right upper quadrant. IMPRESSION: No acute cardiopulmonary findings. Emphysema (ICD10-J43.9). Electronically Signed   By: Zerita Boers M.D.   On: 08/07/2019 15:17   Dg Chest Port 1 View  Result Date: 07/21/2019 CLINICAL DATA:  Port placement.  Metastatic bladder cancer. EXAM: PORTABLE CHEST 1 VIEW COMPARISON:  Chest x-ray dated Feb 24, 2014. FINDINGS: New left chest wall port catheter with tip in the mid SVC. The heart size and mediastinal contours are within normal limits. Chronically coarsened interstitial markings with emphysematous changes. Small area of nodularity at the left lung base. No focal consolidation, pleural effusion, or pneumothorax. No acute osseous abnormality. IMPRESSION: 1. New appropriately positioned left chest wall port catheter without complicating feature. 2. Small area of nodularity at the left lung base. Metastatic disease is not excluded. Electronically Signed   By: Titus Dubin M.D.   On: 07/21/2019 19:39   Dg Abd Portable 1 View  Result Date: 08/07/2019 CLINICAL DATA:  NG tube placement EXAM: PORTABLE ABDOMEN - 1 VIEW COMPARISON:  CT abdomen/pelvis 08/07/2019 FINDINGS: Nasogastric tube with the tip projecting over the stomach. There is ill-defined dilatation of the small bowel consistent with bowel obstruction. There is no evidence of pneumoperitoneum, portal venous gas or pneumatosis. There are no  pathologic calcifications along the expected course of the ureters. Right percutaneous nephrostomy tube. The osseous structures are unremarkable. IMPRESSION: 1. Nasogastric tube with the tip projecting over the stomach. Electronically Signed   By: Kathreen Devoid   On: 08/07/2019 17:39   Dg C-arm 1-60 Min-no Report  Result Date: 07/21/2019 Fluoroscopy was utilized by the requesting physician.  No radiographic interpretation.   Ct Renal Stone Study  Result Date: 07/11/2019 CLINICAL DATA:  RIGHT flank pain and hematuria for 1 week, question stone disease; history of bladder tumor post TURBT in 2015 EXAM: CT ABDOMEN AND PELVIS WITHOUT CONTRAST TECHNIQUE: Multidetector CT imaging of the abdomen and pelvis was performed following the standard protocol without IV contrast. Sagittal and coronal MPR images reconstructed from axial data set. No oral contrast administered for this indication. COMPARISON:  02/23/2014 FINDINGS: Lower chest: Subpleural nodule at lingula 6 mm diameter image 1, new. Lung bases emphysematous. Additional nodule versus scar posteromedial LEFT lung base 5 mm diameter image 7. Additional nodularity at posterior sulcus of RIGHT lower lobe up to 8 mm diameter. 12 mm lingular nodule image 3, subpleural. Hepatobiliary: Gallbladder and liver grossly normal appearance Pancreas: Normal appearance Spleen: Normal appearance Adrenals/Urinary Tract: Adrenal glands and LEFT kidney normal appearance. RIGHT hydronephrosis and hydroureter secondary to distal ureteral obstruction question ureteral tumor versus bladder tumor. Several bladder diverticular noted. Tiny dependent calculus within a LEFT lateral bladder diverticulum. Soft tissue mass within bladder consistent with neoplasm, 9.6 x 9.2 x 7.0 cm. Stomach/Bowel: Scattered colonic diverticulosis. Stomach unremarkable. Appendix not visualized. No bowel dilatation or evidence of obstruction, see below. Vascular/Lymphatic: Atherosclerotic calcifications aorta  and iliac arteries without aneurysm. Reproductive: Enlarged LEFT external iliac lymph node 19 mm short axis image 67. Upper normal sized RIGHT cardiophrenic angle node 9 mm act short axis. 10 mm node anterior to aorta image 23. Additional upper normal sized aortocaval, para-aortic, and pelvic nodes. Other: Extensive abnormal tumor deposits throughout the abdomen consistent with omental caking and peritoneal carcinomatosis. Largest of these masses measures 16.3 x 6.6 cm. Additional LEFT abdominal mass measures 12.7 x 4.9 cm. Tumor mass invading anterior abdominal wall at  medial RIGHT rectus abdominus muscle and umbilicus, measuring 4.1 x 2.9 x 4.8 cm. Small amount of free fluid in the anterior pelvis. No free air. Scattered intra-abdominal collaterals in the anterior abdomen. Musculoskeletal: No acute osseous lesions. 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 Pelvic and retroperitoneal adenopathy. 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 Small amount of ascites. Bibasilar pulmonary nodules question metastases. RIGHT hydronephrosis and hydroureter secondary to distal ureteral obstruction likely by tumor. Findings called to Dr. Corky Downs on 07/11/2019 at 1349 hours. Electronically Signed   By: Lavonia Dana M.D.   On: 07/11/2019 13:51   Ct Image Guided Fluid Drain By Catheter  Result Date: 07/12/2019 INDICATION: Right hydronephrosis secondary to bladder mass. EXAM: Right percutaneous nephrostomy. MEDICATIONS: The patient is currently admitted to the hospital. 1 g of Ancef administered. The antibiotics were administered within an appropriate time frame prior to the initiation of the procedure. ANESTHESIA/SEDATION: Fentanyl 2.0 mcg IV; Versed 25 mg IV Moderate Sedation Time:  Approximately 30 minutes The patient was continuously monitored during the procedure by  the interventional radiology nurse under my direct supervision. COMPLICATIONS: None PROCEDURE: Informed written consent was obtained from the patient after a thorough discussion of the procedural risks, benefits and alternatives. All questions were addressed. Maximal Sterile Barrier Technique was utilized including caps, mask, sterile gowns, sterile gloves, sterile drape, hand hygiene and skin antiseptic. A timeout was performed prior to the initiation of the procedure. Following sterile preparation patient and CT of the patient in prone position, local anesthesia was administered 1% lidocaine. This followed by placement 18 gauge needle into a posterior right renal calyx. This followed by placement of an Amplatz extra stiff wire. This is followed by placement of an 8 French percutaneous drainage catheter. Clear urine obtained. Percutaneous nephrostomy catheter was sutured in place and placed to bag drainage. IMPRESSION: Successful right percutaneous nephrostomy. Electronically Signed   By: Marcello Moores  Register   On: 07/12/2019 12:18    Assessment and plan- Patient is a 68 y.o. male with metastatic bladder cancer, not on chemotherapy, presented for evaluation of nausea vomiting.  Imaging work-up showed small bowel obstruction.  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  Continue supportive care.  NG tube suction. I had a lengthy discussion with patient today. Goal of care, palliative.  Prognosis is poor. Palliative chemotherapy+/- immunotherapy plan will be delayed due to small bowel obstruction. If small bowel obstruction can be spontaneously resolved, will proceed with palliative chemotherapy.  Dose reduction will be considered given his performance status. If no improvement of SBO, comfort care/hospice is reasonable.  Consult palliative care.  Will discuss with [employer]. Continue supportive care with IV  fluids,Protonix, antiemetics.  Nausea/vomiting have improved.  Can consider Reglan if symptoms worsen.  Nephrostomy tube in place.  He met sepsis criteria ith leukocytosis, tachycardia and tachypnea.  This is improving Antibiotics.  Blood cultures negative so far.  Continue cephapirin.  CODE STATUS, DNR/DNI.   Thank you for allowing me to participate in the care of this patient.  Total face to face encounter time for this patient visit was 70 min. >50% of the time was  spent in counseling and coordination of care.    Earlie Server, MD, PhD Hematology Oncology [facility] Pager- [phone] 08/08/2019

## 2019-08-08 NOTE — Telephone Encounter (Signed)
Daughter called reporting that patient is in the hospital and she is asking if Dr Tasia Catchings plans to go ahead with chemotherapy treatment Wednesday. Please return her call [phone]

## 2019-08-08 NOTE — Plan of Care (Signed)

## 2019-08-09 ENCOUNTER — Inpatient Hospital Stay: Payer: Medicare Other

## 2019-08-09 DIAGNOSIS — Z515 Encounter for palliative care: Secondary | ICD-10-CM

## 2019-08-09 DIAGNOSIS — K56609 Unspecified intestinal obstruction, unspecified as to partial versus complete obstruction: Secondary | ICD-10-CM | POA: Diagnosis not present

## 2019-08-09 DIAGNOSIS — E860 Dehydration: Secondary | ICD-10-CM | POA: Diagnosis not present

## 2019-08-09 DIAGNOSIS — R112 Nausea with vomiting, unspecified: Secondary | ICD-10-CM | POA: Diagnosis not present

## 2019-08-09 DIAGNOSIS — N179 Acute kidney failure, unspecified: Secondary | ICD-10-CM | POA: Diagnosis not present

## 2019-08-09 DIAGNOSIS — C7911 Secondary malignant neoplasm of bladder: Secondary | ICD-10-CM

## 2019-08-09 LAB — BASIC METABOLIC PANEL
Anion gap: 11 (ref 5–15)
BUN: 34 mg/dL — ABNORMAL HIGH (ref 8–23)
CO2: 20 mmol/L — ABNORMAL LOW (ref 22–32)
Calcium: 8.3 mg/dL — ABNORMAL LOW (ref 8.9–10.3)
Chloride: 109 mmol/L (ref 98–111)
Creatinine, Ser: 0.86 mg/dL (ref 0.61–1.24)
GFR calc Af Amer: >60 mL/min (ref >60)
GFR calc non Af Amer: >60 mL/min (ref >60)
Glucose, Bld: 75 mg/dL (ref 70–99)
Potassium: 3.9 mmol/L (ref 3.5–5.1)
Sodium: 140 mmol/L (ref 135–145)

## 2019-08-09 LAB — CBC
HCT: 35.5 % — ABNORMAL LOW (ref 39.0–52.0)
Hemoglobin: 11.3 g/dL — ABNORMAL LOW (ref 13.0–17.0)
MCH: 29.2 pg (ref 26.0–34.0)
MCHC: 31.8 g/dL (ref 30.0–36.0)
MCV: 91.7 fL (ref 80.0–100.0)
Platelets: 129 10*3/uL — ABNORMAL LOW (ref 150–400)
RBC: 3.87 MIL/uL — ABNORMAL LOW (ref 4.22–5.81)
RDW: 14.6 % (ref 11.5–15.5)
WBC: 9.8 10*3/uL (ref 4.0–10.5)
nRBC: 0 % (ref 0.0–0.2)

## 2019-08-09 LAB — T3: T3, Total: 62 ng/dL — ABNORMAL LOW (ref 71–180)

## 2019-08-09 MED ORDER — HYDROMORPHONE HCL 1 MG/ML IJ SOLN
0.5000 mg | INTRAMUSCULAR | Status: DC | PRN
  Administered 2019-08-09 – 2019-08-19 (×45): 0.5 mg via INTRAVENOUS
  Filled 2019-08-09 (×46): qty 1

## 2019-08-09 NOTE — Telephone Encounter (Signed)
Called and she did not answer.

## 2019-08-09 NOTE — Consult Note (Signed)
[place]  [phone] [phone]   Name: Johnny Martin Date: 08/09/2019 MRN: [id number]  DOB: Dec 18, 1950  Patient Care Team: Earlie Server, MD as PCP - General (Oncology)    REASON FOR CONSULTATION: Palliative Care consult requested for this 68 y.o. male with multiple medical problems including history of bladder cancer lost to follow-up, who was recently hospitalized 07/11/2019-07/14/2019 with hematuria.  Work-up revealed right-sided hydronephrosis requiring a right nephrostomy tube.  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  Subsequent PET scan on 07/25/2019 shows metastatic disease to bladder, with widespread lymph involvement, bilateral pulmonary nodules, liver, and extensive hypermetabolic peritoneal disease.  He also was noted to have a left neck mass concerning for malignancy.  Patient was admitted to the hospital on 08/07/2019 with intractable nausea and vomiting.  He was found to have a small bowel obstruction per abdominal CT.  Patient has not felt to be a surgical candidate and is being treated conservatively with gastric decompression via NGT. Patient was referred to palliative care to help address goals and manage ongoing symptoms.  SOCIAL HISTORY:     reports that he quit smoking about 3 weeks ago. He smoked 0.50 packs per day. He has never used smokeless tobacco. He reports previous alcohol use. He reports that he does not use drugs.   Patient is unmarried.  He lives at home with his daughter and son-in-law.  He has another daughter who lives nearby.  Patient worked in [employer].  ADVANCE DIRECTIVES:  Not on file  CODE STATUS: DNR/DNI (MOST Form completed on 07/26/2019)  PAST MEDICAL HISTORY: Past Medical History:  Diagnosis Date   Bladder cancer ([place]) 07/21/2019   Cancer ([place])    Bladder cancer    PAST SURGICAL HISTORY:  Past Surgical History:    Procedure Laterality Date   BLADDER SURGERY     BRAIN SURGERY     plate in head per patient since childhood    PORTACATH PLACEMENT Left 07/21/2019   Procedure: INSERTION PORT-A-CATH;  Surgeon: Olean Ree, MD;  Location: [facility] ORS;  Service: General;  Laterality: Left;    HEMATOLOGY/ONCOLOGY HISTORY:  Oncology History  Bladder cancer ([place])  07/21/2019 Initial Diagnosis   Bladder cancer ([place])   07/27/2019 - 07/27/2019 Chemotherapy   The patient had PALONOSETRON HCL INJECTION 0.25 MG/5ML, 0.25 mg, Intravenous,  Once, 0 of 4 cycles CARBOplatin (PARAPLATIN) in sodium chloride 0.9 % 100 mL chemo infusion, , Intravenous,  Once, 0 of 4 cycles gemcitabine (GEMZAR) 1,900 mg in sodium chloride 0.9 % 250 mL chemo infusion, 1,000 mg/m2, Intravenous,  Once, 0 of 4 cycles FOSAPREPITANT 150MG + DEXAMETHASONE INFUSION [facility], , Intravenous,  Once, 0 of 4 cycles  for chemotherapy treatment.    08/03/2019 - 08/03/2019 Chemotherapy   The patient had palonosetron (ALOXI) injection 0.25 mg, 0.25 mg, Intravenous,  Once, 0 of 6 cycles CISplatin (PLATINOL) 133 mg in sodium chloride 0.9 % 500 mL chemo infusion, 70 mg/m2, Intravenous,  Once, 0 of 6 cycles gemcitabine (GEMZAR) 1,900 mg in sodium chloride 0.9 % 250 mL chemo infusion, 1,000 mg/m2, Intravenous,  Once, 0 of 6 cycles fosaprepitant (EMEND) 150 mg, dexamethasone (DECADRON) 12 mg in sodium chloride 0.9 % 145 mL IVPB, , Intravenous,  Once, 0 of 6 cycles  for chemotherapy treatment.    08/10/2019 -  Chemotherapy   The patient had PALONOSETRON HCL INJECTION 0.25 MG/5ML, 0.25 mg, Intravenous,  Once, 0 of 4 cycles CARBOplatin (  PARAPLATIN) in sodium chloride 0.9 % 100 mL chemo infusion, , Intravenous,  Once, 0 of 4 cycles gemcitabine (GEMZAR) 1,900 mg in sodium chloride 0.9 % 250 mL chemo infusion, 1,000 mg/m2, Intravenous,  Once, 0 of 4 cycles FOSAPREPITANT 150MG + DEXAMETHASONE INFUSION [facility], , Intravenous,  Once, 0 of 4 cycles  for chemotherapy  treatment.      ALLERGIES:  has No Known Allergies.  MEDICATIONS:  Current Facility-Administered Medications  Medication Dose Route Frequency Provider Last Rate Last Dose   0.9 %  sodium chloride infusion   Intravenous Continuous Harrie Foreman, MD 125 mL/hr at 08/09/19 0601     acetaminophen (TYLENOL) tablet 650 mg  650 mg Oral Q6H PRN Harrie Foreman, MD       Or   acetaminophen (TYLENOL) suppository 650 mg  650 mg Rectal Q6H PRN Harrie Foreman, MD       Chlorhexidine Gluconate Cloth 2 % PADS 6 each  6 each Topical Daily Harrie Foreman, MD   6 each at 08/09/19 1006   docusate sodium (COLACE) capsule 100 mg  100 mg Oral BID Harrie Foreman, MD       enoxaparin (LOVENOX) injection 40 mg  40 mg Subcutaneous Q24H Harrie Foreman, MD   40 mg at 08/08/19 2120   HYDROmorphone (DILAUDID) injection 0.5 mg  0.5 mg Intravenous Q3H PRN Mayo, Pete Pelt, MD   0.5 mg at 08/09/19 1001   metoprolol tartrate (LOPRESSOR) injection 5 mg  5 mg Intravenous Q6H Mayo, Pete Pelt, MD   5 mg at 08/09/19 1203   nicotine (NICODERM CQ - dosed in mg/24 hours) patch 21 mg  21 mg Transdermal Daily Harrie Foreman, MD   21 mg at 08/08/19 2120   ondansetron (ZOFRAN) tablet 4 mg  4 mg Oral Q6H PRN Harrie Foreman, MD       Or   ondansetron [facility]) injection 4 mg  4 mg Intravenous Q6H PRN Harrie Foreman, MD       pantoprazole (PROTONIX) injection 40 mg  40 mg Intravenous Q24H Mayo, Pete Pelt, MD   40 mg at 08/09/19 1002   polyethylene glycol (MIRALAX / GLYCOLAX) packet 17 g  17 g Oral Daily PRN Harrie Foreman, MD       prochlorperazine (COMPAZINE) injection 10 mg  10 mg Intravenous Q6H PRN Harrie Foreman, MD       senna-docusate (Senokot-S) tablet 2 tablet  2 tablet Oral Daily Harrie Foreman, MD        VITAL SIGNS: BP 140/69 (BP Location: Left Arm)    Pulse 87    Temp 97.6 F (36.4 C)    Resp 18    Wt 152 lb 1.9 oz (69 kg)    SpO2 96%    BMI 20.07 kg/m  Filed  Weights   08/08/19 0500 08/08/19 0654 08/09/19 0500  Weight: 154 lb 5.2 oz (70 kg) 154 lb 5.2 oz (70 kg) 152 lb 1.9 oz (69 kg)    Estimated body mass index is 20.07 kg/m as calculated from the following:   Height as of 08/02/19: 6' 1"  (1.854 m).   Weight as of this encounter: 152 lb 1.9 oz (69 kg).  LABS: CBC:    Component Value Date/Time   WBC 9.8 08/09/2019 0429   HGB 11.3 (L) 08/09/2019 0429   HGB 8.7 (L) 03/02/2014 0948   HCT 35.5 (L) 08/09/2019 0429   HCT 26.2 (L) 03/02/2014 0948   PLT  129 (L) 08/09/2019 0429   PLT 136 (L) 03/02/2014 0948   MCV 91.7 08/09/2019 0429   MCV 88 03/02/2014 0948   NEUTROABS 5.4 07/26/2019 1011   NEUTROABS 12.4 (H) 03/02/2014 0948   LYMPHSABS 1.1 07/26/2019 1011   LYMPHSABS 1.4 03/02/2014 0948   MONOABS 0.7 07/26/2019 1011   MONOABS 0.5 03/02/2014 0948   EOSABS 0.1 07/26/2019 1011   EOSABS 0.3 03/02/2014 0948   BASOSABS 0.0 07/26/2019 1011   BASOSABS 0.0 03/02/2014 0948   Comprehensive Metabolic Panel:    Component Value Date/Time   NA 140 08/09/2019 0429   NA 138 02/26/2014 0307   K 3.9 08/09/2019 0429   K 3.8 02/26/2014 0307   CL 109 08/09/2019 0429   CL 107 02/26/2014 0307   CO2 20 (L) 08/09/2019 0429   CO2 27 02/26/2014 0307   BUN 34 (H) 08/09/2019 0429   BUN 17 02/26/2014 0307   CREATININE 0.86 08/09/2019 0429   CREATININE 1.08 02/26/2014 0307   GLUCOSE 75 08/09/2019 0429   GLUCOSE 89 02/26/2014 0307   CALCIUM 8.3 (L) 08/09/2019 0429   CALCIUM 7.4 (L) 02/26/2014 0307   AST 32 08/07/2019 1115   AST 20 02/23/2014 2141   ALT 18 08/07/2019 1115   ALT 18 02/23/2014 2141   ALKPHOS 143 (H) 08/07/2019 1115   ALKPHOS 49 02/23/2014 2141   BILITOT 1.4 (H) 08/07/2019 1115   BILITOT 0.3 02/23/2014 2141   PROT 7.4 08/07/2019 1115   PROT 5.1 (L) 02/23/2014 2141   ALBUMIN 3.7 08/07/2019 1115   ALBUMIN 2.6 (L) 02/23/2014 2141    RADIOGRAPHIC STUDIES: Ct Abdomen Wo Contrast  Result Date: 07/12/2019 CLINICAL DATA:  Right  hydronephrosis. EXAM: CT ABDOMEN WITHOUT CONTRAST TECHNIQUE: Multidetector CT imaging of the abdomen was performed following the standard protocol without IV contrast. COMPARISON:  Prior recent CT. FINDINGS: Limited CT was obtained in order to determine access for right percutaneous nephrostomy. Right hydronephrosis again identified. Reference is made to percutaneous nephrostomy report. IMPRESSION: Limited CT was obtained in order to determine access for right percutaneous nephrostomy. Reference is made to percutaneous nephrostomy report. Electronically Signed   By: Marcello Moores  Register   On: 07/12/2019 12:20   Dg Abd 1 View  Result Date: 08/09/2019 CLINICAL DATA:  Small bowel obstruction EXAM: ABDOMEN - 1 VIEW COMPARISON:  08/07/2019 FINDINGS: NG tube tip is within the fundus of the stomach, unchanged. Right side nephrostomy catheter again noted, unchanged. There are dilated small bowel loops compatible with small bowel obstruction, slightly progressed since prior study. No organomegaly or free air. IMPRESSION: Small-bowel dilatation slightly progressed since prior study. Findings compatible with continued small bowel obstruction. Electronically Signed   By: Rolm Baptise M.D.   On: 08/09/2019 09:42   Ct Guided Needle Placement  Result Date: 07/13/2019 INDICATION: History of bladder cancer, now with peritoneal masses worrisome for metastatic disease. Please perform CT-guided biopsy for tissue diagnostic purposes. EXAM: CT GUIDANCE NEEDLE PLACEMENT COMPARISON:  CT abdomen and pelvis - 07/11/2019 MEDICATIONS: None. ANESTHESIA/SEDATION: Fentanyl 100 mcg IV; Versed 2 mg IV Sedation time: 10 minutes; The patient was continuously monitored during the procedure by the interventional radiology nurse under my direct supervision. CONTRAST:  None. COMPLICATIONS: None immediate. PROCEDURE: Informed consent was obtained from the patient following an explanation of the procedure, risks, benefits and alternatives. A time out  was performed prior to the initiation of the procedure. The patient was positioned supine on the CT table and a limited CT was performed for procedural planning demonstrating  unchanged size and appearance of the infiltrative mass involving the left pericolic gutter with dominant component measuring at least 12.3 x 6.2 cm (image 26, series 2). The procedure was planned. The operative site was prepped and draped in the usual sterile fashion. Appropriate trajectory was confirmed with a 22 gauge spinal needle after the adjacent tissues were anesthetized with 1% Lidocaine with epinephrine. Under intermittent CT guidance, a 17 gauge coaxial needle was advanced into the peripheral aspect of the mass. Appropriate positioning was confirmed and 5 core needle biopsy samples were obtained with an 18 gauge core needle biopsy device. The co-axial needle was removed following the administration of a Gel-Foam slurry and superficial hemostasis was achieved with manual compression. A limited postprocedural CT was negative for hemorrhage or additional complication. A dressing was placed. The patient tolerated the procedure well without immediate postprocedural complication. IMPRESSION: Technically successful CT guided core needle biopsy of infiltrative mass within the left pericolic gutter. Electronically Signed   By: Sandi Mariscal M.D.   On: 07/13/2019 11:35   Ct Abdomen Pelvis W Contrast  Result Date: 08/07/2019 CLINICAL DATA:  Emesis for 2 days, history of bladder cancer EXAM: CT ABDOMEN AND PELVIS WITH CONTRAST TECHNIQUE: Multidetector CT imaging of the abdomen and pelvis was performed using the standard protocol following bolus administration of intravenous contrast. CONTRAST:  179m OMNIPAQUE IOHEXOL 300 MG/ML  SOLN COMPARISON:  PET-CT, 07/25/2019 FINDINGS: Lower chest: No acute abnormality. Metastatic nodules in the partially included lingula (series 4, image 9). Emphysema. Hepatobiliary: No solid liver abnormality is seen.  No gallstones, gallbladder wall thickening, or biliary dilatation. Pancreas: Unremarkable. No pancreatic ductal dilatation or surrounding inflammatory changes. Spleen: Normal in size without significant abnormality. Adrenals/Urinary Tract: Adrenal glands are unremarkable. Right-sided percutaneous nephrostomy with formed pigtail in the right renal pelvis. No hydronephrosis. Redemonstrated, bulky, predominantly endoluminal bladder mass and soft tissue nodules. Stomach/Bowel: There is redemonstrated, extensive, bulky, metastatic tissue involving the omentum, multiple loops of small bowel, and the anterior abdominal wall. The stomach and proximal to mid small bowel are diffusely fluid distended, with an abrupt transition point in the central abdomen abutting metastatic soft tissue (series 2, image 59) and decompression of the distal small bowel. Vascular/Lymphatic: Aortic atherosclerosis. Multiple enlarged retroperitoneal, iliac, and pelvic sidewall lymph nodes as seen on prior examination. Reproductive: No mass or other significant abnormality. Other: No abdominal wall hernia or abnormality. No abdominopelvic ascites. Musculoskeletal: No acute or significant osseous findings. IMPRESSION: 1. There is redemonstrated, extensive, bulky, metastatic tissue involving the omentum, multiple loops of small bowel, and the anterior abdominal wall, not significant changed in appearance compared to prior PET-CT examination dated 07/25/2019. The stomach and proximal to mid small bowel are diffusely fluid distended, with an abrupt transition point in the central abdomen abutting metastatic soft tissue (series 2, image 59) and decompression of the distal small bowel. Findings are consistent with small bowel obstruction. 2. Other findings of primary bladder malignancy and advanced metastatic disease as detailed above, not significantly changed in comparison to PET-CT dated 07/25/2019. 3. Aortic Atherosclerosis (ICD10-I70.0) and  Emphysema (ICD10-J43.9). Electronically Signed   By: AEddie CandleM.D.   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 EXAM: NUCLEAR MEDICINE PET SKULL BASE TO THIGH TECHNIQUE: 8.795 mCi F-18 FDG was injected intravenously. Full-ring PET imaging was performed from the skull base to thigh after the radiotracer. CT data was obtained and  used for attenuation correction and anatomic localization. Fasting blood glucose: 93 mg/dl COMPARISON:  None. FINDINGS: Mediastinal blood pool activity: SUV max 1.36 . Liver activity: SUV max NA NECK: Enlarged and hypermetabolic left level 2 lymph nodes. Nodal mass measures 3.4 by 1.9 cm and has an SUV max of 7.06 Incidental CT findings: none CHEST: Hypermetabolic right paratracheal, subcarinal and bilateral hilar lymph nodes are identified. Right paratracheal node measures 1.4 cm and has an SUV max of 10.38. Index subcarinal node measures 1.1 cm and has an SUV max of 8.93. Index left hilar node measures 1.2 cm and has an SUV max of 6.41. Right hilar lymph node measures 1.2 cm and has an SUV max 8.66. Right CP angle lymph node is FDG avid measuring 1.4 cm with SUV max of 7.57. Bilateral FDG avid pulmonary nodules. Right lower lobe lung nodule measures 1 cm and has an SUV max of 1.88. Hypermetabolic subpleural nodule in the lingula measures 1 cm and has an SUV max of 6.6. Incidental CT findings: Advanced changes of emphysema. Aortic atherosclerosis. ABDOMEN/PELVIS: There is a few small foci of FDG uptake above liver background activity without definite CT correlate, equivocal for metastasis. For example, within segment 8 there is an area of increased uptake within SUV max of 2.67. This is compared with background liver activity of 2.15. No abnormal uptake within the pancreas, spleen or adrenal glands. Soft tissue mass involving the bladder measures 9 cm  within SUV max of 15.7. Hypermetabolic right retrocrural lymph node measures 1 cm and has an SUV max of 7.13. Multiple hypermetabolic retroperitoneal and bilateral iliac and inguinal lymph nodes identified. Index left periaortic node measures 1.1 cm within SUV max of 8.01. Index retrocaval lymph node measures 1.3 cm and has an SUV max of 6.25. Left common iliac node measures 1.3 cm and has an SUV max of 1.2. Right common iliac node measures 1.5 cm and has an SUV max of 6.4. Left external iliac node measures 1.8 cm with SUV max of 8.79. Left inguinal node measures 0.7 cm and has an SUV max of 4.87. Left pericolic gutter soft tissue mass measures 12.4 cm and has SUV max of 11.9. Right lateral abdominal mass measures 8 cm and has SUV max of 12.07. Large mass within the ventral abdomen measures 16.6 cm within SUV max of 11.36. Small FDG avid peritoneal implants are identified along the surface of the liver. Incidental CT findings: Right-sided percutaneous nephrostomy tube is in place. No hydronephrosis identified bilaterally. Aortic atherosclerosis. SKELETON: No focal hypermetabolic activity to suggest skeletal metastasis. Incidental CT findings: none IMPRESSION: 1. Large mass within the urinary bladder is identified and is hypermetabolic concerning for primary urothelial neoplasm. 2. Hypermetabolic left cervical, thoracic, abdominal, and pelvic lymph nodes consistent with metastatic adenopathy. 3. Extensive, bulky, hypermetabolic peritoneal disease within the abdomen and pelvis. 4. Bilateral pulmonary nodules concerning for metastatic disease. 5. A few small foci (without CT correlate) of mild FDG uptake are noted in the liver which are equivocal for metastasis. 6. No findings to suggest osseous metastasis. 7. Aortic Atherosclerosis (ICD10-I70.0) and Emphysema (ICD10-J43.9). Electronically Signed   By: Kerby Moors M.D.   On: 07/25/2019 13:30   Dg Chest Portable 1 View  Result Date: 08/07/2019 CLINICAL DATA:   Elevated white count, emesis for 3 days. EXAM: PORTABLE CHEST 1 VIEW COMPARISON:  Chest radiograph dated 07/21/2019 FINDINGS: The heart size and mediastinal contours are within normal limits. Emphysematous changes are noted. Both lungs are clear. The visualized skeletal structures  are unremarkable. A left subclavian approach central venous port catheter tip overlies the superior vena cava. A pigtail catheter overlies the right upper quadrant. IMPRESSION: No acute cardiopulmonary findings. Emphysema (ICD10-J43.9). Electronically Signed   By: Zerita Boers M.D.   On: 08/07/2019 15:17   Dg Chest Port 1 View  Result Date: 07/21/2019 CLINICAL DATA:  Port placement.  Metastatic bladder cancer. EXAM: PORTABLE CHEST 1 VIEW COMPARISON:  Chest x-ray dated Feb 24, 2014. FINDINGS: New left chest wall port catheter with tip in the mid SVC. The heart size and mediastinal contours are within normal limits. Chronically coarsened interstitial markings with emphysematous changes. Small area of nodularity at the left lung base. No focal consolidation, pleural effusion, or pneumothorax. No acute osseous abnormality. IMPRESSION: 1. New appropriately positioned left chest wall port catheter without complicating feature. 2. Small area of nodularity at the left lung base. Metastatic disease is not excluded. Electronically Signed   By: Titus Dubin M.D.   On: 07/21/2019 19:39   Dg Abd Portable 1 View  Result Date: 08/07/2019 CLINICAL DATA:  NG tube placement EXAM: PORTABLE ABDOMEN - 1 VIEW COMPARISON:  CT abdomen/pelvis 08/07/2019 FINDINGS: Nasogastric tube with the tip projecting over the stomach. There is ill-defined dilatation of the small bowel consistent with bowel obstruction. There is no evidence of pneumoperitoneum, portal venous gas or pneumatosis. There are no pathologic calcifications along the expected course of the ureters. Right percutaneous nephrostomy tube. The osseous structures are unremarkable. IMPRESSION: 1.  Nasogastric tube with the tip projecting over the stomach. Electronically Signed   By: Kathreen Devoid   On: 08/07/2019 17:39   Dg C-arm 1-60 Min-no Report  Result Date: 07/21/2019 Fluoroscopy was utilized by the requesting physician.  No radiographic interpretation.   Ct Renal Stone Study  Result Date: 07/11/2019 CLINICAL DATA:  RIGHT flank pain and hematuria for 1 week, question stone disease; history of bladder tumor post TURBT in 2015 EXAM: CT ABDOMEN AND PELVIS WITHOUT CONTRAST TECHNIQUE: Multidetector CT imaging of the abdomen and pelvis was performed following the standard protocol without IV contrast. Sagittal and coronal MPR images reconstructed from axial data set. No oral contrast administered for this indication. COMPARISON:  02/23/2014 FINDINGS: Lower chest: Subpleural nodule at lingula 6 mm diameter image 1, new. Lung bases emphysematous. Additional nodule versus scar posteromedial LEFT lung base 5 mm diameter image 7. Additional nodularity at posterior sulcus of RIGHT lower lobe up to 8 mm diameter. 12 mm lingular nodule image 3, subpleural. Hepatobiliary: Gallbladder and liver grossly normal appearance Pancreas: Normal appearance Spleen: Normal appearance Adrenals/Urinary Tract: Adrenal glands and LEFT kidney normal appearance. RIGHT hydronephrosis and hydroureter secondary to distal ureteral obstruction question ureteral tumor versus bladder tumor. Several bladder diverticular noted. Tiny dependent calculus within a LEFT lateral bladder diverticulum. Soft tissue mass within bladder consistent with neoplasm, 9.6 x 9.2 x 7.0 cm. Stomach/Bowel: Scattered colonic diverticulosis. Stomach unremarkable. Appendix not visualized. No bowel dilatation or evidence of obstruction, see below. Vascular/Lymphatic: Atherosclerotic calcifications aorta and iliac arteries without aneurysm. Reproductive: Enlarged LEFT external iliac lymph node 19 mm short axis image 67. Upper normal sized RIGHT cardiophrenic angle  node 9 mm act short axis. 10 mm node anterior to aorta image 23. Additional upper normal sized aortocaval, para-aortic, and pelvic nodes. Other: Extensive abnormal tumor deposits throughout the abdomen consistent with omental caking and peritoneal carcinomatosis. Largest of these masses measures 16.3 x 6.6 cm. Additional LEFT abdominal mass measures 12.7 x 4.9 cm. Tumor mass invading anterior abdominal wall at  medial RIGHT rectus abdominus muscle and umbilicus, measuring 4.1 x 2.9 x 4.8 cm. Small amount of free fluid in the anterior pelvis. No free air. Scattered intra-abdominal collaterals in the anterior abdomen. Musculoskeletal: No acute osseous lesions. 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 Pelvic and retroperitoneal adenopathy. 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 Small amount of ascites. Bibasilar pulmonary nodules question metastases. RIGHT hydronephrosis and hydroureter secondary to distal ureteral obstruction likely by tumor. Findings called to Dr. Corky Downs on 07/11/2019 at 1349 hours. Electronically Signed   By: Lavonia Dana M.D.   On: 07/11/2019 13:51   Ct Image Guided Fluid Drain By Catheter  Result Date: 07/12/2019 INDICATION: Right hydronephrosis secondary to bladder mass. EXAM: Right percutaneous nephrostomy. MEDICATIONS: The patient is currently admitted to the hospital. 1 g of Ancef administered. The antibiotics were administered within an appropriate time frame prior to the initiation of the procedure. ANESTHESIA/SEDATION: Fentanyl 2.0 mcg IV; Versed 25 mg IV Moderate Sedation Time:  Approximately 30 minutes The patient was continuously monitored during the procedure by the interventional radiology nurse under my direct supervision. COMPLICATIONS: None PROCEDURE: Informed written consent was obtained from the patient after a  thorough discussion of the procedural risks, benefits and alternatives. All questions were addressed. Maximal Sterile Barrier Technique was utilized including caps, mask, sterile gowns, sterile gloves, sterile drape, hand hygiene and skin antiseptic. A timeout was performed prior to the initiation of the procedure. Following sterile preparation patient and CT of the patient in prone position, local anesthesia was administered 1% lidocaine. This followed by placement 18 gauge needle into a posterior right renal calyx. This followed by placement of an Amplatz extra stiff wire. This is followed by placement of an 8 French percutaneous drainage catheter. Clear urine obtained. Percutaneous nephrostomy catheter was sutured in place and placed to bag drainage. IMPRESSION: Successful right percutaneous nephrostomy. Electronically Signed   By: Marcello Moores  Register   On: 07/12/2019 12:18    PERFORMANCE STATUS (ECOG) : 2 - Symptomatic, <50% confined to bed  Review of Systems Unless otherwise noted, a complete review of systems is negative.  Physical Exam General: NAD, frail appearing, thin Pulmonary: Unlabored Extremities: no edema, no joint deformities Skin: no rashes Neurological: Weakness but otherwise nonfocal  IMPRESSION: Patient is familiar to me from the clinic.  I met today with patient and his daughter.  Patient symptoms are improved with use of nasogastric tube and gastric decompression.  He had a bowel movement earlier today.  Patient complains of dry mouth.  We will see if nursing can provide him with glycerin swabs.  Patient has also seen Dr. Tasia Catchings in consultation during this hospitalization.  Patient is able to relate to me their conversation and understands that it is possible that his condition may not allow future treatment and hospice has been discussed as a possible option.  In the event of improvement, patient would be interested in pursuing continued chemotherapy.  Patient says he wants to "take  it a day at a time."  We did discuss CODE STATUS.  I had previously completed a MOST Form on 07/26/2019 detailing desire for DNAR and DNI.  Patient opted to be a limited code on admission.  He says that he was initially considering option of CPR but after further discussion regarding the probable futility he now says he would not want that.  Both patient and daughter verbalize a  desire to change CODE STATUS to DNR.  PLAN: -Continue current scope of treatment -DNR/DNI -Will follow   Patient expressed understanding and was in agreement with this plan. He also understands that He can call the clinic at any time with any questions, concerns, or complaints.     Time Total: 60 minutes  Visit consisted of counseling and education dealing with the complex and emotionally intense issues of symptom management and palliative care in the setting of serious and potentially life-threatening illness.Greater than 50%  of this time was spent counseling and coordinating care related to the above assessment and plan.  Signed by: Altha Harm, PhD, NP-C

## 2019-08-09 NOTE — Telephone Encounter (Signed)
Dr. Tasia Catchings saw patient yesterday in the hospital. She will call daughter.

## 2019-08-09 NOTE — Progress Notes (Signed)
[name] [facility] Day(s): 2.   Interval History: Patient seen and examined, no acute events or new complaints overnight. Patient reports he continues to feel better this morning. His abdominal distension is improving. No pain. He reports that he has passed flatus multiple times and has had a BM. No NGT output was recorded. No other complaints.   Vital signs in last 24 hours: [min-max] current  Temp:  [97.6 F (36.4 C)-98.2 F (36.8 C)] 98.2 F (36.8 C) (10/27 0549) Pulse Rate:  [84-96] 86 (10/27 0549) Resp:  [17] 17 (10/27 0549) BP: (119-140)/(63-87) 133/87 (10/27 0549) SpO2:  [94 %-98 %] 96 % (10/27 0549) Weight:  NN:316265 kg] 69 kg (10/27 0500)       Weight: 69 kg BMI (Calculated): 20.07   Intake/Output last 2 shifts:  10/26 0701 - 10/27 0700 In: 1165 [I.V.:873.2; IV Piggyback:291.8] Out: 975 [Urine:975]   Physical Exam:  Constitutional: alert, cooperative and no distress  HENT: normocephalic without obvious abnormality, NGT in place Eyes: PERRL, EOM's grossly intact and symmetric  Respiratory: breathing non-labored at rest  Chest: Port in left upper chest, incision healing well with dermabond Cardiovascular: regular rate and sinus rhythm  Gastrointestinal: soft, non-tender, still with mild distension. There is a palpable firm mass in the supraumbilical region. No rebound/guarding   Labs:  CBC Latest Ref Rng & Units 08/09/2019 08/08/2019 08/07/2019  WBC 4.0 - 10.5 K/uL 9.8 12.1(H) 18.8(H)  Hemoglobin 13.0 - 17.0 g/dL 11.3(L) 12.0(L) 14.9  Hematocrit 39.0 - 52.0 % 35.5(L) 36.6(L) 44.7  Platelets 150 - 400 K/uL 129(L) 153 226   CMP Latest Ref Rng & Units 08/09/2019 08/08/2019 08/07/2019  Glucose 70 - 99 mg/dL 75 97 142(H)  BUN 8 - 23 mg/dL 34(H) 43(H) 45(H)  Creatinine 0.61 - 1.24 mg/dL 0.86 1.15 1.45(H)  Sodium 135 - 145 mmol/L 140 136 133(L)  Potassium 3.5 - 5.1 mmol/L 3.9 4.3 4.8  Chloride 98 - 111 mmol/L 109 101 90(L)  CO2 22 -  32 mmol/L 20(L) 19(L) 23  Calcium 8.9 - 10.3 mg/dL 8.3(L) 7.9(L) 9.1  Total Protein 6.5 - 8.1 g/dL - - 7.4  Total Bilirubin 0.3 - 1.2 mg/dL - - 1.4(H)  Alkaline Phos 38 - 126 U/L - - 143(H)  AST 15 - 41 U/L - - 32  ALT 0 - 44 U/L - - 18     Imaging studies: No new pertinent imaging studies   Assessment/Plan:  68 y.o. male with clinically improving small bowel obstruction related to peritoneal carcinomatosiscomplicated by pertinent comorbidities including history of metastatic bladder cancer.   - Will obtain KUB this morning to reassess abdomen  - If there is improvement on KUB will do clamping trial prior to removal of NGT  - NPO for now   - Continue NGT decompression; monitor output             - Serial abdominal examination +/- KUBs  - Appreciate oncology and palliative consults             - pain control prn             - mobilization encouraged   All of the above findings and recommendations were discussed with the patient, and the medical team, and all of patient's questions were answered to his expressed satisfaction.  -- Johnny Simon, PA-C [facility] 08/09/2019, 7:13 AM [phone] M-F: 7am - 4pm

## 2019-08-09 NOTE — Progress Notes (Signed)
[facility] [place] NAME: Johnny Martin    MR#:  [id number]  DATE OF BIRTH:  November 28, 1950  SUBJECTIVE:   Patient states he is feeling better today.  His abdominal pain has almost completely resolved.  He has been passing a lot of gas and did have a small bowel movement this morning.  REVIEW OF SYSTEMS:  Review of Systems  Constitutional: Negative for chills and fever.  HENT: Negative for congestion and sore throat.   Eyes: Negative for blurred vision and double vision.  Respiratory: Negative for cough and shortness of breath.   Cardiovascular: Negative for chest pain and palpitations.  Gastrointestinal: Negative for abdominal pain, diarrhea, nausea and vomiting.  Genitourinary: Negative for dysuria and urgency.  Musculoskeletal: Negative for back pain and neck pain.  Neurological: Negative for dizziness and headaches.  Psychiatric/Behavioral: Negative for depression. The patient is not nervous/anxious.    DRUG ALLERGIES:  No Known Allergies VITALS:  Blood pressure 133/87, pulse 86, temperature 98.2 F (36.8 C), temperature source Oral, resp. rate 17, weight 69 kg, SpO2 96 %. PHYSICAL EXAMINATION:  Physical Exam  GENERAL:  Laying in the bed with no acute distress.  HEENT: Head atraumatic, normocephalic. Pupils equal, round, reactive to light and accommodation. No scleral icterus. Extraocular muscles intact. NG tube in place. NECK:  Supple, no jugular venous distention. No thyroid enlargement. LUNGS: Lungs are clear to auscultation bilaterally. No wheezes, crackles, rhonchi. No use of accessory muscles of respiration.  CARDIOVASCULAR: RRR, S1, S2 normal. No murmurs, rubs, or gallops.  ABDOMEN: Soft.  +mild distention, no tenderness to palpation.  No rebound or guarding. +BS. EXTREMITIES: No pedal edema, cyanosis, or clubbing.  NEUROLOGIC: CN 2-12 intact, no focal deficits. 5/5 muscle strength throughout all extremities. Sensation intact  throughout. Gait not checked.  PSYCHIATRIC: The patient is alert and oriented x 3.  SKIN: No obvious rash, lesion, or ulcer.  LABORATORY PANEL:  Male CBC Recent Labs  Lab 08/09/19 0429  WBC 9.8  HGB 11.3*  HCT 35.5*  PLT 129*   ------------------------------------------------------------------------------------------------------------------ Chemistries  Recent Labs  Lab 08/07/19 1115  08/09/19 0429  NA 133*   < > 140  K 4.8   < > 3.9  CL 90*   < > 109  CO2 23   < > 20*  GLUCOSE 142*   < > 75  BUN 45*   < > 34*  CREATININE 1.45*   < > 0.86  CALCIUM 9.1   < > 8.3*  AST 32  --   --   ALT 18  --   --   ALKPHOS 143*  --   --   BILITOT 1.4*  --   --    < > = values in this interval not displayed.   RADIOLOGY:  Dg Abd 1 View  Result Date: 08/09/2019 CLINICAL DATA:  Small bowel obstruction EXAM: ABDOMEN - 1 VIEW COMPARISON:  08/07/2019 FINDINGS: NG tube tip is within the fundus of the stomach, unchanged. Right side nephrostomy catheter again noted, unchanged. There are dilated small bowel loops compatible with small bowel obstruction, slightly progressed since prior study. No organomegaly or free air. IMPRESSION: Small-bowel dilatation slightly progressed since prior study. Findings compatible with continued small bowel obstruction. Electronically Signed   By: Rolm Baptise M.D.   On: 08/09/2019 09:42   ASSESSMENT AND PLAN:   Small bowel obstruction due to metastatic bladder cancer and peritoneal carcinomatosis- symptoms have improved with NG tube.  Patient did have a bowel movement this morning. -Surgery following-  continue NG tube for today and obtain repeat KUB in the morning -NPO -Continue IVFs -Pain control and IV anti-emetics -Palliative care consult  Sepsis - meeting sepsis criteria on admission with leukocytosis, tachycardia, and tachypnea, but sepsis has been ruled out with negative cultures. -Blood cultures with no growth -Urine culture with multiple species  -Stop antibiotics  Metastatic bladder cancer with peritoneal carcinomatosis with nephrostomy tube in place -Oncology following- planning to do palliative chemo if SBO resolves vs comfort care if SBO does not resolve -Palliative care consult  Tobacco use -Continue nicotine patch  Daughter, [name], updated via the phone.  All the records are reviewed and case discussed with Care Management/Social Worker. Management plans discussed with the patient, family and they are in agreement.  CODE STATUS: Partial Code  TOTAL TIME TAKING CARE OF THIS PATIENT: 38 minutes.   More than 50% of the time was spent in counseling/coordination of care: YES  POSSIBLE D/C IN 2-3 DAYS, DEPENDING ON CLINICAL CONDITION.   Berna Spare Mayo M.D on 08/09/2019 at 11:20 AM  Between 7am to 6pm - Pager - [phone]  After 6pm go to [email or web address] - Technical brewer [facility]  Office  [phone]  CC: Primary care physician; Earlie Server, MD  Note: This dictation was prepared with Dragon dictation along with smaller phrase technology. Any transcriptional errors that result from this process are unintentional.

## 2019-08-09 NOTE — Progress Notes (Signed)
Hematology/Oncology Progress Note [facility] [phone] [phone]  Patient Care Team: Earlie Server, MD as PCP - General (Oncology)   Name of the patient: Johnny Martin  [id number]  1951/03/19  Date of visit: 08/09/19   INTERVAL HISTORY-  Patient was seen at the bedside.  He had a bowel movement yesterday.  Subjectively feeling better. Daughter [name] is at bedside.    Review of systems- Review of Systems  Constitutional: Positive for fatigue.  HENT:   Positive for lump/mass.   Respiratory: Negative for chest tightness and cough.   Gastrointestinal: Positive for abdominal distention. Negative for abdominal pain and nausea.  Genitourinary: Negative for dysuria and frequency.   Skin: Negative for rash.  Neurological: Negative for headaches and light-headedness.  Psychiatric/Behavioral: Negative for confusion.    No Known Allergies  Patient Active Problem List   Diagnosis Date Noted   Protein-calorie malnutrition, severe 08/08/2019   Sepsis ([place])    Nausea and vomiting    Dehydration    SBO (small bowel obstruction) ([place]) 08/07/2019   Goals of care, counseling/discussion 07/21/2019   Bladder cancer ([place]) 07/21/2019   Drug-induced constipation    Bladder mass 07/11/2019   Hydronephrosis due to obstruction of ureter 07/11/2019   Gross hematuria    Peritoneal carcinomatosis ([facility])    Mass of left side of neck    AKI (acute kidney injury) ([place])      Past Medical History:  Diagnosis Date   Bladder cancer ([place]) 07/21/2019   Cancer ([place])    Bladder cancer     Past Surgical History:  Procedure Laterality Date   BLADDER SURGERY     BRAIN SURGERY     plate in head per patient since childhood    PORTACATH PLACEMENT Left 07/21/2019   Procedure: INSERTION PORT-A-CATH;  Surgeon: Olean Ree, MD;  Location: [facility] ORS;  Service: General;  Laterality: Left;    Social History   Socioeconomic History   Marital  status: Divorced    Spouse name: Not on file   Number of children: Not on file   Years of education: Not on file   Highest education level: Not on file  Occupational History   Not on file  Social Needs   Financial resource strain: Not on file   Food insecurity    Worry: Not on file    Inability: Not on file   Transportation needs    Medical: Not on file    Non-medical: Not on file  Tobacco Use   Smoking status: Former Smoker    Packs/day: 0.50    Quit date: 07/13/2019    Years since quitting: 0.0   Smokeless tobacco: Never Used  Substance and Sexual Activity   Alcohol use: Not Currently    Frequency: Never    Comment: quit 3 years ago    Drug use: Never   Sexual activity: Not on file  Lifestyle   Physical activity    Days per week: Not on file    Minutes per session: Not on file   Stress: Not on file  Relationships   Social connections    Talks on phone: Not on file    Gets together: Not on file    Attends religious service: Not on file    Active member of club or organization: Not on file    Attends meetings of clubs or organizations: Not on file    Relationship status: Not on file   Intimate partner violence    Fear  of current or ex partner: Not on file    Emotionally abused: Not on file    Physically abused: Not on file    Forced sexual activity: Not on file  Other Topics Concern   Not on file  Social History Narrative   Lives at home with daughter and son in [place].     Family History  Problem Relation Age of Onset   Cervical cancer Sister    Kidney failure Mother    Stroke Mother    Alzheimer's disease Father      Current Facility-Administered Medications:    0.9 %  sodium chloride infusion, , Intravenous, Continuous, Harrie Foreman, MD, Last Rate: 125 mL/hr at 08/09/19 0601   acetaminophen (TYLENOL) tablet 650 mg, 650 mg, Oral, Q6H PRN **OR** acetaminophen (TYLENOL) suppository 650 mg, 650 mg, Rectal, Q6H PRN, Harrie Foreman, MD   Chlorhexidine Gluconate Cloth 2 % PADS 6 each, 6 each, Topical, Daily, Harrie Foreman, MD, 6 each at 08/09/19 1006   docusate sodium (COLACE) capsule 100 mg, 100 mg, Oral, BID, Harrie Foreman, MD   enoxaparin (LOVENOX) injection 40 mg, 40 mg, Subcutaneous, Q24H, Harrie Foreman, MD, 40 mg at 08/08/19 2120   HYDROmorphone (DILAUDID) injection 0.5 mg, 0.5 mg, Intravenous, Q3H PRN, Mayo, Pete Pelt, MD, 0.5 mg at 08/09/19 1001   metoprolol tartrate (LOPRESSOR) injection 5 mg, 5 mg, Intravenous, Q6H, Mayo, Pete Pelt, MD, 5 mg at 08/09/19 1203   nicotine (NICODERM CQ - dosed in mg/24 hours) patch 21 mg, 21 mg, Transdermal, Daily, Harrie Foreman, MD, 21 mg at 08/08/19 2120   ondansetron (ZOFRAN) tablet 4 mg, 4 mg, Oral, Q6H PRN **OR** ondansetron (ZOFRAN) injection 4 mg, 4 mg, Intravenous, Q6H PRN, Harrie Foreman, MD   pantoprazole (PROTONIX) injection 40 mg, 40 mg, Intravenous, Q24H, Mayo, Pete Pelt, MD, 40 mg at 08/09/19 1002   polyethylene glycol (MIRALAX / GLYCOLAX) packet 17 g, 17 g, Oral, Daily PRN, Harrie Foreman, MD   prochlorperazine (COMPAZINE) injection 10 mg, 10 mg, Intravenous, Q6H PRN, Harrie Foreman, MD   senna-docusate (Senokot-S) tablet 2 tablet, 2 tablet, Oral, Daily, Harrie Foreman, MD   Physical exam:  Vitals:   08/09/19 0022 08/09/19 0500 08/09/19 0549 08/09/19 1202  BP: 140/73  133/87 140/69  Pulse: 91  86 87  Resp:   17 18  Temp:   98.2 F (36.8 C) 97.6 F (36.4 C)  TempSrc:   Oral   SpO2: 97%  96% 96%  Weight:  152 lb 1.9 oz (69 kg)     Physical Exam  Constitutional: He is oriented to person, place, and time. No distress.  HENT:  Head: Normocephalic and atraumatic.  Eyes: Pupils are equal, round, and reactive to light. EOM are normal. No scleral icterus.  Neck: Normal range of motion. Neck supple.  Cardiovascular: Normal rate and regular rhythm.  No murmur heard. Pulmonary/Chest: Effort normal. No respiratory  distress. He has no rales. He exhibits no tenderness.  Abdominal: Soft. He exhibits distension. There is no abdominal tenderness.  Musculoskeletal: Normal range of motion.        General: No edema.  Neurological: He is alert and oriented to person, place, and time. No cranial nerve deficit. He exhibits normal muscle tone. Coordination normal.  Skin: Skin is warm and dry. He is not diaphoretic. No erythema.  Psychiatric: Affect normal.       CMP Latest Ref Rng & Units 08/09/2019  Glucose 70 -  99 mg/dL 75  BUN 8 - 23 mg/dL 34(H)  Creatinine 0.61 - 1.24 mg/dL 0.86  Sodium 135 - 145 mmol/L 140  Potassium 3.5 - 5.1 mmol/L 3.9  Chloride 98 - 111 mmol/L 109  CO2 22 - 32 mmol/L 20(L)  Calcium 8.9 - 10.3 mg/dL 8.3(L)  Total Protein 6.5 - 8.1 g/dL -  Total Bilirubin 0.3 - 1.2 mg/dL -  Alkaline Phos 38 - 126 U/L -  AST 15 - 41 U/L -  ALT 0 - 44 U/L -   CBC Latest Ref Rng & Units 08/09/2019  WBC 4.0 - 10.5 K/uL 9.8  Hemoglobin 13.0 - 17.0 g/dL 11.3(L)  Hematocrit 39.0 - 52.0 % 35.5(L)  Platelets 150 - 400 K/uL 129(L)    RADIOGRAPHIC STUDIES: I have personally reviewed the radiological images as listed and agreed with the findings in the report.  Ct Abdomen Wo Contrast  Result Date: 07/12/2019 CLINICAL DATA:  Right hydronephrosis. EXAM: CT ABDOMEN WITHOUT CONTRAST TECHNIQUE: Multidetector CT imaging of the abdomen was performed following the standard protocol without IV contrast. COMPARISON:  Prior recent CT. FINDINGS: Limited CT was obtained in order to determine access for right percutaneous nephrostomy. Right hydronephrosis again identified. Reference is made to percutaneous nephrostomy report. IMPRESSION: Limited CT was obtained in order to determine access for right percutaneous nephrostomy. Reference is made to percutaneous nephrostomy report. Electronically Signed   By: Marcello Moores  Register   On: 07/12/2019 12:20   Dg Abd 1 View  Result Date: 08/09/2019 CLINICAL DATA:  Small bowel  obstruction EXAM: ABDOMEN - 1 VIEW COMPARISON:  08/07/2019 FINDINGS: NG tube tip is within the fundus of the stomach, unchanged. Right side nephrostomy catheter again noted, unchanged. There are dilated small bowel loops compatible with small bowel obstruction, slightly progressed since prior study. No organomegaly or free air. IMPRESSION: Small-bowel dilatation slightly progressed since prior study. Findings compatible with continued small bowel obstruction. Electronically Signed   By: Rolm Baptise M.D.   On: 08/09/2019 09:42   Ct Guided Needle Placement  Result Date: 07/13/2019 INDICATION: History of bladder cancer, now with peritoneal masses worrisome for metastatic disease. Please perform CT-guided biopsy for tissue diagnostic purposes. EXAM: CT GUIDANCE NEEDLE PLACEMENT COMPARISON:  CT abdomen and pelvis - 07/11/2019 MEDICATIONS: None. ANESTHESIA/SEDATION: Fentanyl 100 mcg IV; Versed 2 mg IV Sedation time: 10 minutes; The patient was continuously monitored during the procedure by the interventional radiology nurse under my direct supervision. CONTRAST:  None. COMPLICATIONS: None immediate. PROCEDURE: Informed consent was obtained from the patient following an explanation of the procedure, risks, benefits and alternatives. A time out was performed prior to the initiation of the procedure. The patient was positioned supine on the CT table and a limited CT was performed for procedural planning demonstrating unchanged size and appearance of the infiltrative mass involving the left pericolic gutter with dominant component measuring at least 12.3 x 6.2 cm (image 26, series 2). The procedure was planned. The operative site was prepped and draped in the usual sterile fashion. Appropriate trajectory was confirmed with a 22 gauge spinal needle after the adjacent tissues were anesthetized with 1% Lidocaine with epinephrine. Under intermittent CT guidance, a 17 gauge coaxial needle was advanced into the peripheral  aspect of the mass. Appropriate positioning was confirmed and 5 core needle biopsy samples were obtained with an 18 gauge core needle biopsy device. The co-axial needle was removed following the administration of a Gel-Foam slurry and superficial hemostasis was achieved with manual compression. A limited postprocedural CT  was negative for hemorrhage or additional complication. A dressing was placed. The patient tolerated the procedure well without immediate postprocedural complication. IMPRESSION: Technically successful CT guided core needle biopsy of infiltrative mass within the left pericolic gutter. Electronically Signed   By: Sandi Mariscal M.D.   On: 07/13/2019 11:35   Ct Abdomen Pelvis W Contrast  Result Date: 08/07/2019 CLINICAL DATA:  Emesis for 2 days, history of bladder cancer EXAM: CT ABDOMEN AND PELVIS WITH CONTRAST TECHNIQUE: Multidetector CT imaging of the abdomen and pelvis was performed using the standard protocol following bolus administration of intravenous contrast. CONTRAST:  112m OMNIPAQUE IOHEXOL 300 MG/ML  SOLN COMPARISON:  PET-CT, 07/25/2019 FINDINGS: Lower chest: No acute abnormality. Metastatic nodules in the partially included lingula (series 4, image 9). Emphysema. Hepatobiliary: No solid liver abnormality is seen. No gallstones, gallbladder wall thickening, or biliary dilatation. Pancreas: Unremarkable. No pancreatic ductal dilatation or surrounding inflammatory changes. Spleen: Normal in size without significant abnormality. Adrenals/Urinary Tract: Adrenal glands are unremarkable. Right-sided percutaneous nephrostomy with formed pigtail in the right renal pelvis. No hydronephrosis. Redemonstrated, bulky, predominantly endoluminal bladder mass and soft tissue nodules. Stomach/Bowel: There is redemonstrated, extensive, bulky, metastatic tissue involving the omentum, multiple loops of small bowel, and the anterior abdominal wall. The stomach and proximal to mid small bowel are diffusely  fluid distended, with an abrupt transition point in the central abdomen abutting metastatic soft tissue (series 2, image 59) and decompression of the distal small bowel. Vascular/Lymphatic: Aortic atherosclerosis. Multiple enlarged retroperitoneal, iliac, and pelvic sidewall lymph nodes as seen on prior examination. Reproductive: No mass or other significant abnormality. Other: No abdominal wall hernia or abnormality. No abdominopelvic ascites. Musculoskeletal: No acute or significant osseous findings. IMPRESSION: 1. There is redemonstrated, extensive, bulky, metastatic tissue involving the omentum, multiple loops of small bowel, and the anterior abdominal wall, not significant changed in appearance compared to prior PET-CT examination dated 07/25/2019. The stomach and proximal to mid small bowel are diffusely fluid distended, with an abrupt transition point in the central abdomen abutting metastatic soft tissue (series 2, image 59) and decompression of the distal small bowel. Findings are consistent with small bowel obstruction. 2. Other findings of primary bladder malignancy and advanced metastatic disease as detailed above, not significantly changed in comparison to PET-CT dated 07/25/2019. 3. Aortic Atherosclerosis (ICD10-I70.0) and Emphysema (ICD10-J43.9). Electronically Signed   By: AEddie CandleM.D.   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 EXAM: NUCLEAR MEDICINE PET SKULL BASE TO THIGH TECHNIQUE: 8.795 mCi F-18 FDG was injected intravenously. Full-ring PET imaging was performed from the skull base to thigh after the radiotracer. CT data was obtained and used for attenuation correction and anatomic localization. Fasting blood glucose: 93 mg/dl COMPARISON:  None. FINDINGS: Mediastinal blood pool activity: SUV max 1.36 . Liver activity: SUV max NA NECK: Enlarged and  hypermetabolic left level 2 lymph nodes. Nodal mass measures 3.4 by 1.9 cm and has an SUV max of 7.06 Incidental CT findings: none CHEST: Hypermetabolic right paratracheal, subcarinal and bilateral hilar lymph nodes are identified. Right paratracheal node measures 1.4 cm and has an SUV max of 10.38. Index subcarinal node measures 1.1 cm and has an SUV max of 8.93. Index left hilar node measures 1.2 cm and has an SUV max of 6.41. Right hilar lymph node measures 1.2 cm and has an SUV max 8.66. Right CP angle lymph node  is FDG avid measuring 1.4 cm with SUV max of 7.57. Bilateral FDG avid pulmonary nodules. Right lower lobe lung nodule measures 1 cm and has an SUV max of 1.88. Hypermetabolic subpleural nodule in the lingula measures 1 cm and has an SUV max of 6.6. Incidental CT findings: Advanced changes of emphysema. Aortic atherosclerosis. ABDOMEN/PELVIS: There is a few small foci of FDG uptake above liver background activity without definite CT correlate, equivocal for metastasis. For example, within segment 8 there is an area of increased uptake within SUV max of 2.67. This is compared with background liver activity of 2.15. No abnormal uptake within the pancreas, spleen or adrenal glands. Soft tissue mass involving the bladder measures 9 cm within SUV max of 15.7. Hypermetabolic right retrocrural lymph node measures 1 cm and has an SUV max of 7.13. Multiple hypermetabolic retroperitoneal and bilateral iliac and inguinal lymph nodes identified. Index left periaortic node measures 1.1 cm within SUV max of 8.01. Index retrocaval lymph node measures 1.3 cm and has an SUV max of 6.25. Left common iliac node measures 1.3 cm and has an SUV max of 1.2. Right common iliac node measures 1.5 cm and has an SUV max of 6.4. Left external iliac node measures 1.8 cm with SUV max of 8.79. Left inguinal node measures 0.7 cm and has an SUV max of 4.87. Left pericolic gutter soft tissue mass measures 12.4 cm and has SUV max of 11.9.  Right lateral abdominal mass measures 8 cm and has SUV max of 12.07. Large mass within the ventral abdomen measures 16.6 cm within SUV max of 11.36. Small FDG avid peritoneal implants are identified along the surface of the liver. Incidental CT findings: Right-sided percutaneous nephrostomy tube is in place. No hydronephrosis identified bilaterally. Aortic atherosclerosis. SKELETON: No focal hypermetabolic activity to suggest skeletal metastasis. Incidental CT findings: none IMPRESSION: 1. Large mass within the urinary bladder is identified and is hypermetabolic concerning for primary urothelial neoplasm. 2. Hypermetabolic left cervical, thoracic, abdominal, and pelvic lymph nodes consistent with metastatic adenopathy. 3. Extensive, bulky, hypermetabolic peritoneal disease within the abdomen and pelvis. 4. Bilateral pulmonary nodules concerning for metastatic disease. 5. A few small foci (without CT correlate) of mild FDG uptake are noted in the liver which are equivocal for metastasis. 6. No findings to suggest osseous metastasis. 7. Aortic Atherosclerosis (ICD10-I70.0) and Emphysema (ICD10-J43.9). Electronically Signed   By: Kerby Moors M.D.   On: 07/25/2019 13:30   Dg Chest Portable 1 View  Result Date: 08/07/2019 CLINICAL DATA:  Elevated white count, emesis for 3 days. EXAM: PORTABLE CHEST 1 VIEW COMPARISON:  Chest radiograph dated 07/21/2019 FINDINGS: The heart size and mediastinal contours are within normal limits. Emphysematous changes are noted. Both lungs are clear. The visualized skeletal structures are unremarkable. A left subclavian approach central venous port catheter tip overlies the superior vena cava. A pigtail catheter overlies the right upper quadrant. IMPRESSION: No acute cardiopulmonary findings. Emphysema (ICD10-J43.9). Electronically Signed   By: Zerita Boers M.D.   On: 08/07/2019 15:17   Dg Chest Port 1 View  Result Date: 07/21/2019 CLINICAL DATA:  Port placement.  Metastatic  bladder cancer. EXAM: PORTABLE CHEST 1 VIEW COMPARISON:  Chest x-ray dated Feb 24, 2014. FINDINGS: New left chest wall port catheter with tip in the mid SVC. The heart size and mediastinal contours are within normal limits. Chronically coarsened interstitial markings with emphysematous changes. Small area of nodularity at the left lung base. No focal consolidation, pleural effusion, or pneumothorax. No acute  osseous abnormality. IMPRESSION: 1. New appropriately positioned left chest wall port catheter without complicating feature. 2. Small area of nodularity at the left lung base. Metastatic disease is not excluded. Electronically Signed   By: Titus Dubin M.D.   On: 07/21/2019 19:39   Dg Abd Portable 1 View  Result Date: 08/07/2019 CLINICAL DATA:  NG tube placement EXAM: PORTABLE ABDOMEN - 1 VIEW COMPARISON:  CT abdomen/pelvis 08/07/2019 FINDINGS: Nasogastric tube with the tip projecting over the stomach. There is ill-defined dilatation of the small bowel consistent with bowel obstruction. There is no evidence of pneumoperitoneum, portal venous gas or pneumatosis. There are no pathologic calcifications along the expected course of the ureters. Right percutaneous nephrostomy tube. The osseous structures are unremarkable. IMPRESSION: 1. Nasogastric tube with the tip projecting over the stomach. Electronically Signed   By: Kathreen Devoid   On: 08/07/2019 17:39   Dg C-arm 1-60 Min-no Report  Result Date: 07/21/2019 Fluoroscopy was utilized by the requesting physician.  No radiographic interpretation.   Ct Renal Stone Study  Result Date: 07/11/2019 CLINICAL DATA:  RIGHT flank pain and hematuria for 1 week, question stone disease; history of bladder tumor post TURBT in 2015 EXAM: CT ABDOMEN AND PELVIS WITHOUT CONTRAST TECHNIQUE: Multidetector CT imaging of the abdomen and pelvis was performed following the standard protocol without IV contrast. Sagittal and coronal MPR images reconstructed from axial data  set. No oral contrast administered for this indication. COMPARISON:  02/23/2014 FINDINGS: Lower chest: Subpleural nodule at lingula 6 mm diameter image 1, new. Lung bases emphysematous. Additional nodule versus scar posteromedial LEFT lung base 5 mm diameter image 7. Additional nodularity at posterior sulcus of RIGHT lower lobe up to 8 mm diameter. 12 mm lingular nodule image 3, subpleural. Hepatobiliary: Gallbladder and liver grossly normal appearance Pancreas: Normal appearance Spleen: Normal appearance Adrenals/Urinary Tract: Adrenal glands and LEFT kidney normal appearance. RIGHT hydronephrosis and hydroureter secondary to distal ureteral obstruction question ureteral tumor versus bladder tumor. Several bladder diverticular noted. Tiny dependent calculus within a LEFT lateral bladder diverticulum. Soft tissue mass within bladder consistent with neoplasm, 9.6 x 9.2 x 7.0 cm. Stomach/Bowel: Scattered colonic diverticulosis. Stomach unremarkable. Appendix not visualized. No bowel dilatation or evidence of obstruction, see below. Vascular/Lymphatic: Atherosclerotic calcifications aorta and iliac arteries without aneurysm. Reproductive: Enlarged LEFT external iliac lymph node 19 mm short axis image 67. Upper normal sized RIGHT cardiophrenic angle node 9 mm act short axis. 10 mm node anterior to aorta image 23. Additional upper normal sized aortocaval, para-aortic, and pelvic nodes. Other: Extensive abnormal tumor deposits throughout the abdomen consistent with omental caking and peritoneal carcinomatosis. Largest of these masses measures 16.3 x 6.6 cm. Additional LEFT abdominal mass measures 12.7 x 4.9 cm. Tumor mass invading anterior abdominal wall at medial RIGHT rectus abdominus muscle and umbilicus, measuring 4.1 x 2.9 x 4.8 cm. Small amount of free fluid in the anterior pelvis. No free air. Scattered intra-abdominal collaterals in the anterior abdomen. Musculoskeletal: No acute osseous lesions. IMPRESSION: Large  bladder neoplasm approximately 9.6 x 9.2 x 7.0 cm. Bladder diverticula. Pelvic and retroperitoneal adenopathy. 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 Small amount of ascites. Bibasilar pulmonary nodules question metastases. RIGHT hydronephrosis and hydroureter secondary to distal ureteral obstruction likely by tumor. Findings called to Dr. Corky Downs on 07/11/2019 at 1349 hours. Electronically Signed   By: Lavonia Dana M.D.   On: 07/11/2019 13:51   Ct Image Guided Fluid  Drain By Catheter  Result Date: 07/12/2019 INDICATION: Right hydronephrosis secondary to bladder mass. EXAM: Right percutaneous nephrostomy. MEDICATIONS: The patient is currently admitted to the hospital. 1 g of Ancef administered. The antibiotics were administered within an appropriate time frame prior to the initiation of the procedure. ANESTHESIA/SEDATION: Fentanyl 2.0 mcg IV; Versed 25 mg IV Moderate Sedation Time:  Approximately 30 minutes The patient was continuously monitored during the procedure by the interventional radiology nurse under my direct supervision. COMPLICATIONS: None PROCEDURE: Informed written consent was obtained from the patient after a thorough discussion of the procedural risks, benefits and alternatives. All questions were addressed. Maximal Sterile Barrier Technique was utilized including caps, mask, sterile gowns, sterile gloves, sterile drape, hand hygiene and skin antiseptic. A timeout was performed prior to the initiation of the procedure. Following sterile preparation patient and CT of the patient in prone position, local anesthesia was administered 1% lidocaine. This followed by placement 18 gauge needle into a posterior right renal calyx. This followed by placement of an Amplatz extra stiff wire. This is followed by placement of an 8 French percutaneous drainage catheter. Clear urine obtained.  Percutaneous nephrostomy catheter was sutured in place and placed to bag drainage. IMPRESSION: Successful right percutaneous nephrostomy. Electronically Signed   By: Marcello Moores  Register   On: 07/12/2019 12:18    Assessment and plan-  68 year old male metastatic bladder cancer, not yet started on chemotherapy, presented for evaluation of nausea vomiting.  Imaging work-up showed small bowel obstruction.  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  Continue supportive care. NG tube suction Physical examination reveals less distended abdomen. KUB shows persistent bowel distention and obstruction. Agree with continuing NG suction.  #I called patient's daughter [name] today and had a lengthy discussion with her. I also discussed with patient diet and also discussed with patient's other daughter [name] at the bedside. Prognosis is poor.  I will see how he does in the next 24 to 48 hours.  If small bowel obstruction is refractory to supportive care, not resolving, comfort/hospice is reasonable. Continue antiemetics.  Recommend considering [facility] Palliative care service on board.  Nephrostomy tube in place.  He met sepsis criteria ith leukocytosis, tachycardia and tachypnea.  This is improving AKI has resolved. Agree with stopping antibiotics.  Blood cultures negative so far.    CODE STATUS, DNR/DNI.  Thank you for allowing me to participate in the care of this patient.  Total face to face encounter time for this patient visit was 35 min. >50% of the time was  spent in counseling and coordination of care.    Earlie Server, MD, PhD Hematology Oncology [facility] Pager- [phone] 08/09/2019

## 2019-08-09 NOTE — Progress Notes (Signed)
Johnny Martin requested prayer for re-assurance and validation. He reports "I had a heart to heart with God last night.  If it is my time I am ready." Johnny Martin openly offered insight into his understanding of his prognosis. He is willing to accept treatment that offers comfort and is open to hospice when appropriate especially if the hospice house is an option. 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 Prayer of assurance and validation were offered. Chaplain will refer to on-call chaplain on 10.28.2020 to offer a follow-up visit for additional assurance and validation.     08/09/19 1800  Clinical Encounter Type  Visited With Patient  Visit Type Initial  Referral From Patient

## 2019-08-09 NOTE — Telephone Encounter (Signed)
LC, please cancel this weeks appts.

## 2019-08-09 NOTE — Telephone Encounter (Signed)
Done... 08/10/19 Port lab/MD/Infusion/Palliative Care appts has been cancelled as requested.

## 2019-08-10 ENCOUNTER — Inpatient Hospital Stay: Payer: Medicare Other | Admitting: Hospice and Palliative Medicine

## 2019-08-10 ENCOUNTER — Inpatient Hospital Stay: Payer: Medicare Other

## 2019-08-10 ENCOUNTER — Inpatient Hospital Stay: Payer: Medicare Other | Admitting: Oncology

## 2019-08-10 ENCOUNTER — Other Ambulatory Visit: Payer: Self-pay | Admitting: Radiology

## 2019-08-10 DIAGNOSIS — K56609 Unspecified intestinal obstruction, unspecified as to partial versus complete obstruction: Secondary | ICD-10-CM | POA: Diagnosis not present

## 2019-08-10 DIAGNOSIS — C7911 Secondary malignant neoplasm of bladder: Secondary | ICD-10-CM | POA: Diagnosis not present

## 2019-08-10 DIAGNOSIS — E43 Unspecified severe protein-calorie malnutrition: Secondary | ICD-10-CM | POA: Diagnosis not present

## 2019-08-10 LAB — CBC
HCT: 37.8 % — ABNORMAL LOW (ref 39.0–52.0)
Hemoglobin: 12.3 g/dL — ABNORMAL LOW (ref 13.0–17.0)
MCH: 29.3 pg (ref 26.0–34.0)
MCHC: 32.5 g/dL (ref 30.0–36.0)
MCV: 90 fL (ref 80.0–100.0)
Platelets: 157 10*3/uL (ref 150–400)
RBC: 4.2 MIL/uL — ABNORMAL LOW (ref 4.22–5.81)
RDW: 15 % (ref 11.5–15.5)
WBC: 11.3 10*3/uL — ABNORMAL HIGH (ref 4.0–10.5)
nRBC: 0 % (ref 0.0–0.2)

## 2019-08-10 LAB — BASIC METABOLIC PANEL
Anion gap: 14 (ref 5–15)
BUN: 27 mg/dL — ABNORMAL HIGH (ref 8–23)
CO2: 16 mmol/L — ABNORMAL LOW (ref 22–32)
Calcium: 9 mg/dL (ref 8.9–10.3)
Chloride: 115 mmol/L — ABNORMAL HIGH (ref 98–111)
Creatinine, Ser: 0.91 mg/dL (ref 0.61–1.24)
GFR calc Af Amer: >60 mL/min (ref >60)
GFR calc non Af Amer: >60 mL/min (ref >60)
Glucose, Bld: 80 mg/dL (ref 70–99)
Potassium: 4.3 mmol/L (ref 3.5–5.1)
Sodium: 145 mmol/L (ref 135–145)

## 2019-08-10 MED ORDER — DEXAMETHASONE SODIUM PHOSPHATE 4 MG/ML IJ SOLN
4.0000 mg | Freq: Three times a day (TID) | INTRAMUSCULAR | Status: DC
  Administered 2019-08-10 – 2019-08-12 (×7): 4 mg via INTRAVENOUS
  Filled 2019-08-10 (×7): qty 1

## 2019-08-10 MED ORDER — METOCLOPRAMIDE HCL 5 MG/ML IJ SOLN
5.0000 mg | Freq: Three times a day (TID) | INTRAMUSCULAR | Status: DC
  Administered 2019-08-10 (×2): 5 mg via INTRAVENOUS
  Filled 2019-08-10 (×2): qty 2

## 2019-08-10 MED ORDER — METOCLOPRAMIDE HCL 5 MG/ML IJ SOLN
10.0000 mg | Freq: Three times a day (TID) | INTRAMUSCULAR | Status: DC
  Administered 2019-08-11 – 2019-08-18 (×22): 10 mg via INTRAVENOUS
  Filled 2019-08-10 (×22): qty 2

## 2019-08-10 MED ORDER — OCTREOTIDE ACETATE 100 MCG/ML IJ SOLN
100.0000 ug | Freq: Two times a day (BID) | INTRAMUSCULAR | Status: DC
  Administered 2019-08-10 – 2019-08-18 (×17): 100 ug via SUBCUTANEOUS
  Filled 2019-08-10 (×24): qty 1

## 2019-08-10 NOTE — Progress Notes (Signed)
Hematology/Oncology Progress Note [facility] [phone] [phone]  Patient Care Team: Earlie Server, MD as PCP - General (Oncology)   Name of the patient: Johnny Martin  [id number]  [birth year]  Date of visit: 08/10/19   INTERVAL HISTORY-  Patient was seen at the bedside.  Patient had small bowel movements this morning. No family members at bedside. Patient reports more discomfort in his abdomen.  Continue to have NG output. KUB this morning still shows distended loops of small bowel.   Review of systems-  Review of Systems  Constitutional: Positive for appetite change, fatigue and unexpected weight change.  Respiratory: Negative for hemoptysis.   Cardiovascular: Negative for chest pain.  Gastrointestinal: Positive for abdominal distention and abdominal pain.  Genitourinary: Negative for dysuria.   Neurological: Negative for light-headedness.  Psychiatric/Behavioral: Negative for confusion.     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)    Protein-calorie malnutrition, severe 08/08/2019   Sepsis ([place])    Nausea and vomiting    Dehydration    SBO (small bowel obstruction) ([place]) 08/07/2019   Goals of care, counseling/discussion 07/21/2019   Bladder cancer ([place]) 07/21/2019   Drug-induced constipation    Bladder mass 07/11/2019   Hydronephrosis due to obstruction of ureter 07/11/2019   Gross hematuria    Peritoneal carcinomatosis ([facility])    Mass of left side of neck    AKI (acute kidney injury) ([place])      Past Medical History:  Diagnosis Date   Bladder cancer ([place]) 07/21/2019   Cancer ([place])    Bladder cancer     Past Surgical History:  Procedure Laterality Date   BLADDER SURGERY     BRAIN SURGERY     plate in head per patient since childhood    PORTACATH PLACEMENT Left 07/21/2019   Procedure: INSERTION PORT-A-CATH;  Surgeon: Olean Ree, MD;  Location: [facility] ORS;  Service: General;  Laterality: Left;    Social History   Socioeconomic History   Marital status: Divorced    Spouse name: Not on file   Number of children: Not on file   Years of education: Not on file   Highest education level: Not on file  Occupational History   Not on file  Social Needs   Financial resource strain: Not on file   Food insecurity    Worry: Not on file    Inability: Not on file   Transportation needs    Medical: Not on file    Non-medical: Not on file  Tobacco Use   Smoking status: Former Smoker    Packs/day: 0.50    Quit date: 07/13/2019    Years since quitting: 0.0   Smokeless tobacco: Never Used  Substance and Sexual Activity   Alcohol use: Not Currently    Frequency: Never    Comment: quit 3 years ago    Drug use: Never   Sexual activity: Not on file  Lifestyle   Physical activity    Days per week: Not on file    Minutes per session: Not on file   Stress: Not on file  Relationships   Social connections    Talks on phone: Not on file    Gets together: Not on file    Attends religious service: Not on file    Active member of club or organization: Not on file  Attends meetings of clubs or organizations: Not on file    Relationship status: Not on file   Intimate partner violence    Fear of current or ex partner: Not on file    Emotionally abused: Not on file    Physically abused: Not on file    Forced sexual activity: Not on file  Other Topics Concern   Not on file  Social History Narrative   Lives at home with daughter and son in [place].     Family History  Problem Relation Age of Onset   Cervical cancer Sister    Kidney failure Mother    Stroke Mother    Alzheimer's disease Father      Current Facility-Administered Medications:    0.9 %  sodium chloride infusion, , Intravenous, Continuous, Harrie Foreman, MD, Last Rate: 125  mL/hr at 08/10/19 1221   acetaminophen (TYLENOL) tablet 650 mg, 650 mg, Oral, Q6H PRN **OR** acetaminophen (TYLENOL) suppository 650 mg, 650 mg, Rectal, Q6H PRN, Harrie Foreman, MD   Chlorhexidine Gluconate Cloth 2 % PADS 6 each, 6 each, Topical, Daily, Harrie Foreman, MD, 6 each at 08/10/19 2774   docusate sodium (COLACE) capsule 100 mg, 100 mg, Oral, BID, Harrie Foreman, MD   enoxaparin (LOVENOX) injection 40 mg, 40 mg, Subcutaneous, Q24H, Harrie Foreman, MD, 40 mg at 08/10/19 2125   HYDROmorphone (DILAUDID) injection 0.5 mg, 0.5 mg, Intravenous, Q3H PRN, Mayo, Pete Pelt, MD, 0.5 mg at 08/10/19 2121   metoCLOPramide (REGLAN) injection 5 mg, 5 mg, Intravenous, Q8H, Piscoya, Jose, MD, 5 mg at 08/10/19 2126   metoprolol tartrate (LOPRESSOR) injection 5 mg, 5 mg, Intravenous, Q6H, Mayo, Pete Pelt, MD, 5 mg at 08/10/19 1734   nicotine (NICODERM CQ - dosed in mg/24 hours) patch 21 mg, 21 mg, Transdermal, Daily, Harrie Foreman, MD, 21 mg at 08/10/19 2128   octreotide (SANDOSTATIN) injection 100 mcg, 100 mcg, Subcutaneous, Q12H, Piscoya, Jose, MD, 100 mcg at 08/10/19 1219   ondansetron (ZOFRAN) tablet 4 mg, 4 mg, Oral, Q6H PRN **OR** ondansetron (ZOFRAN) injection 4 mg, 4 mg, Intravenous, Q6H PRN, Harrie Foreman, MD   pantoprazole (PROTONIX) injection 40 mg, 40 mg, Intravenous, Q24H, Mayo, Pete Pelt, MD, 40 mg at 08/10/19 1287   polyethylene glycol (MIRALAX / GLYCOLAX) packet 17 g, 17 g, Oral, Daily PRN, Harrie Foreman, MD   prochlorperazine (COMPAZINE) injection 10 mg, 10 mg, Intravenous, Q6H PRN, Harrie Foreman, MD   senna-docusate (Senokot-S) tablet 2 tablet, 2 tablet, Oral, Daily, Harrie Foreman, MD   Physical exam:  Vitals:   08/10/19 0815 08/10/19 1204 08/10/19 1530 08/10/19 2016  BP: (!) 150/80 134/73 (!) 150/70 130/67  Pulse: 98 98 87 83  Resp: 18  18 20   Temp: 97.7 F (36.5 C)  98.2 F (36.8 C) 98.2 F (36.8 C)  TempSrc:   Oral Oral    SpO2: 97%  95% 97%  Weight:       Physical Exam  Constitutional: He is oriented to person, place, and time. No distress.  Cachectic  HENT:  Head: Normocephalic and atraumatic.  Mouth/Throat: No oropharyngeal exudate.  NG tube in place  Eyes: Pupils are equal, round, and reactive to light. EOM are normal. No scleral icterus.  Neck: Normal range of motion. Neck supple.  Cardiovascular: Normal rate and regular rhythm.  No murmur heard. Pulmonary/Chest: Effort normal. No respiratory distress. He has no rales. He exhibits no tenderness.  Abdominal: Soft. He exhibits distension. There  is no abdominal tenderness.  Sluggish bowel sounds  Nephrostomy tube  Musculoskeletal: Normal range of motion.        General: No edema.  Neurological: He is alert and oriented to person, place, and time. He exhibits normal muscle tone.  Skin: Skin is warm and dry. He is not diaphoretic. No erythema.  Psychiatric: Affect normal.      CMP Latest Ref Rng & Units 08/10/2019  Glucose 70 - 99 mg/dL 80  BUN 8 - 23 mg/dL 27(H)  Creatinine 0.61 - 1.24 mg/dL 0.91  Sodium 135 - 145 mmol/L 145  Potassium 3.5 - 5.1 mmol/L 4.3  Chloride 98 - 111 mmol/L 115(H)  CO2 22 - 32 mmol/L 16(L)  Calcium 8.9 - 10.3 mg/dL 9.0  Total Protein 6.5 - 8.1 g/dL -  Total Bilirubin 0.3 - 1.2 mg/dL -  Alkaline Phos 38 - 126 U/L -  AST 15 - 41 U/L -  ALT 0 - 44 U/L -   CBC Latest Ref Rng & Units 08/10/2019  WBC 4.0 - 10.5 K/uL 11.3(H)  Hemoglobin 13.0 - 17.0 g/dL 12.3(L)  Hematocrit 39.0 - 52.0 % 37.8(L)  Platelets 150 - 400 K/uL 157    RADIOGRAPHIC STUDIES: I have personally reviewed the radiological images as listed and agreed with the findings in the report.  Ct Abdomen Wo Contrast  Result Date: 07/12/2019 CLINICAL DATA:  Right hydronephrosis. EXAM: CT ABDOMEN WITHOUT CONTRAST TECHNIQUE: Multidetector CT imaging of the abdomen was performed following the standard protocol without IV contrast. COMPARISON:  Prior  recent CT. FINDINGS: Limited CT was obtained in order to determine access for right percutaneous nephrostomy. Right hydronephrosis again identified. Reference is made to percutaneous nephrostomy report. IMPRESSION: Limited CT was obtained in order to determine access for right percutaneous nephrostomy. Reference is made to percutaneous nephrostomy report. Electronically Signed   By: Marcello Moores  Register   On: 07/12/2019 12:20   Dg Abd 1 View  Result Date: 08/10/2019 CLINICAL DATA:  68 year old male with a history of small bowel obstruction EXAM: ABDOMEN - 1 VIEW COMPARISON:  08/09/2019 FINDINGS: Similar appearance of the lower chest. Gastric tube terminates within the stomach. Unchanged appearance of the right percutaneous nephrostomy. Dilated small bowel loops within the mid abdomen. Paucity of colonic and rectal gas. Small formed stool within the rectum. No unexpected radiopaque foreign body. No displaced fracture IMPRESSION: Dilated small bowel loops indicating ongoing obstruction/ileus. Relative absence of colonic gas. Unchanged gastric tube and right percutaneous nephrostomy. Electronically Signed   By: Corrie Mckusick D.O.   On: 08/10/2019 08:09   Dg Abd 1 View  Result Date: 08/09/2019 CLINICAL DATA:  Small bowel obstruction EXAM: ABDOMEN - 1 VIEW COMPARISON:  08/07/2019 FINDINGS: NG tube tip is within the fundus of the stomach, unchanged. Right side nephrostomy catheter again noted, unchanged. There are dilated small bowel loops compatible with small bowel obstruction, slightly progressed since prior study. No organomegaly or free air. IMPRESSION: Small-bowel dilatation slightly progressed since prior study. Findings compatible with continued small bowel obstruction. Electronically Signed   By: Rolm Baptise M.D.   On: 08/09/2019 09:42   Ct Guided Needle Placement  Result Date: 07/13/2019 INDICATION: History of bladder cancer, now with peritoneal masses worrisome for metastatic disease. Please perform  CT-guided biopsy for tissue diagnostic purposes. EXAM: CT GUIDANCE NEEDLE PLACEMENT COMPARISON:  CT abdomen and pelvis - 07/11/2019 MEDICATIONS: None. ANESTHESIA/SEDATION: Fentanyl 100 mcg IV; Versed 2 mg IV Sedation time: 10 minutes; The patient was continuously monitored during the procedure by  the interventional radiology nurse under my direct supervision. CONTRAST:  None. COMPLICATIONS: None immediate. PROCEDURE: Informed consent was obtained from the patient following an explanation of the procedure, risks, benefits and alternatives. A time out was performed prior to the initiation of the procedure. The patient was positioned supine on the CT table and a limited CT was performed for procedural planning demonstrating unchanged size and appearance of the infiltrative mass involving the left pericolic gutter with dominant component measuring at least 12.3 x 6.2 cm (image 26, series 2). The procedure was planned. The operative site was prepped and draped in the usual sterile fashion. Appropriate trajectory was confirmed with a 22 gauge spinal needle after the adjacent tissues were anesthetized with 1% Lidocaine with epinephrine. Under intermittent CT guidance, a 17 gauge coaxial needle was advanced into the peripheral aspect of the mass. Appropriate positioning was confirmed and 5 core needle biopsy samples were obtained with an 18 gauge core needle biopsy device. The co-axial needle was removed following the administration of a Gel-Foam slurry and superficial hemostasis was achieved with manual compression. A limited postprocedural CT was negative for hemorrhage or additional complication. A dressing was placed. The patient tolerated the procedure well without immediate postprocedural complication. IMPRESSION: Technically successful CT guided core needle biopsy of infiltrative mass within the left pericolic gutter. Electronically Signed   By: Sandi Mariscal M.D.   On: 07/13/2019 11:35   Ct Abdomen Pelvis W  Contrast  Result Date: 08/07/2019 CLINICAL DATA:  Emesis for 2 days, history of bladder cancer EXAM: CT ABDOMEN AND PELVIS WITH CONTRAST TECHNIQUE: Multidetector CT imaging of the abdomen and pelvis was performed using the standard protocol following bolus administration of intravenous contrast. CONTRAST:  165m OMNIPAQUE IOHEXOL 300 MG/ML  SOLN COMPARISON:  PET-CT, 07/25/2019 FINDINGS: Lower chest: No acute abnormality. Metastatic nodules in the partially included lingula (series 4, image 9). Emphysema. Hepatobiliary: No solid liver abnormality is seen. No gallstones, gallbladder wall thickening, or biliary dilatation. Pancreas: Unremarkable. No pancreatic ductal dilatation or surrounding inflammatory changes. Spleen: Normal in size without significant abnormality. Adrenals/Urinary Tract: Adrenal glands are unremarkable. Right-sided percutaneous nephrostomy with formed pigtail in the right renal pelvis. No hydronephrosis. Redemonstrated, bulky, predominantly endoluminal bladder mass and soft tissue nodules. Stomach/Bowel: There is redemonstrated, extensive, bulky, metastatic tissue involving the omentum, multiple loops of small bowel, and the anterior abdominal wall. The stomach and proximal to mid small bowel are diffusely fluid distended, with an abrupt transition point in the central abdomen abutting metastatic soft tissue (series 2, image 59) and decompression of the distal small bowel. Vascular/Lymphatic: Aortic atherosclerosis. Multiple enlarged retroperitoneal, iliac, and pelvic sidewall lymph nodes as seen on prior examination. Reproductive: No mass or other significant abnormality. Other: No abdominal wall hernia or abnormality. No abdominopelvic ascites. Musculoskeletal: No acute or significant osseous findings. IMPRESSION: 1. There is redemonstrated, extensive, bulky, metastatic tissue involving the omentum, multiple loops of small bowel, and the anterior abdominal wall, not significant changed in  appearance compared to prior PET-CT examination dated 07/25/2019. The stomach and proximal to mid small bowel are diffusely fluid distended, with an abrupt transition point in the central abdomen abutting metastatic soft tissue (series 2, image 59) and decompression of the distal small bowel. Findings are consistent with small bowel obstruction. 2. Other findings of primary bladder malignancy and advanced metastatic disease as detailed above, not significantly changed in comparison to PET-CT dated 07/25/2019. 3. Aortic Atherosclerosis (ICD10-I70.0) and Emphysema (ICD10-J43.9). Electronically Signed   By: ADorna BloomD.  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 EXAM: NUCLEAR MEDICINE PET SKULL BASE TO THIGH TECHNIQUE: 8.795 mCi F-18 FDG was injected intravenously. Full-ring PET imaging was performed from the skull base to thigh after the radiotracer. CT data was obtained and used for attenuation correction and anatomic localization. Fasting blood glucose: 93 mg/dl COMPARISON:  None. FINDINGS: Mediastinal blood pool activity: SUV max 1.36 . Liver activity: SUV max NA NECK: Enlarged and hypermetabolic left level 2 lymph nodes. Nodal mass measures 3.4 by 1.9 cm and has an SUV max of 7.06 Incidental CT findings: none CHEST: Hypermetabolic right paratracheal, subcarinal and bilateral hilar lymph nodes are identified. Right paratracheal node measures 1.4 cm and has an SUV max of 10.38. Index subcarinal node measures 1.1 cm and has an SUV max of 8.93. Index left hilar node measures 1.2 cm and has an SUV max of 6.41. Right hilar lymph node measures 1.2 cm and has an SUV max 8.66. Right CP angle lymph node is FDG avid measuring 1.4 cm with SUV max of 7.57. Bilateral FDG avid pulmonary nodules. Right lower lobe lung nodule measures 1 cm and has an SUV max of 1.88. Hypermetabolic  subpleural nodule in the lingula measures 1 cm and has an SUV max of 6.6. Incidental CT findings: Advanced changes of emphysema. Aortic atherosclerosis. ABDOMEN/PELVIS: There is a few small foci of FDG uptake above liver background activity without definite CT correlate, equivocal for metastasis. For example, within segment 8 there is an area of increased uptake within SUV max of 2.67. This is compared with background liver activity of 2.15. No abnormal uptake within the pancreas, spleen or adrenal glands. Soft tissue mass involving the bladder measures 9 cm within SUV max of 15.7. Hypermetabolic right retrocrural lymph node measures 1 cm and has an SUV max of 7.13. Multiple hypermetabolic retroperitoneal and bilateral iliac and inguinal lymph nodes identified. Index left periaortic node measures 1.1 cm within SUV max of 8.01. Index retrocaval lymph node measures 1.3 cm and has an SUV max of 6.25. Left common iliac node measures 1.3 cm and has an SUV max of 1.2. Right common iliac node measures 1.5 cm and has an SUV max of 6.4. Left external iliac node measures 1.8 cm with SUV max of 8.79. Left inguinal node measures 0.7 cm and has an SUV max of 4.87. Left pericolic gutter soft tissue mass measures 12.4 cm and has SUV max of 11.9. Right lateral abdominal mass measures 8 cm and has SUV max of 12.07. Large mass within the ventral abdomen measures 16.6 cm within SUV max of 11.36. Small FDG avid peritoneal implants are identified along the surface of the liver. Incidental CT findings: Right-sided percutaneous nephrostomy tube is in place. No hydronephrosis identified bilaterally. Aortic atherosclerosis. SKELETON: No focal hypermetabolic activity to suggest skeletal metastasis. Incidental CT findings: none IMPRESSION: 1. Large mass within the urinary bladder is identified and is hypermetabolic concerning for primary urothelial neoplasm. 2. Hypermetabolic left cervical, thoracic, abdominal, and pelvic lymph nodes  consistent with metastatic adenopathy. 3. Extensive, bulky, hypermetabolic peritoneal disease within the abdomen and pelvis. 4. Bilateral pulmonary nodules concerning for metastatic disease. 5. A few small foci (without CT correlate) of mild FDG uptake are noted in the liver which are equivocal for metastasis. 6. No findings to suggest osseous metastasis. 7. Aortic Atherosclerosis (ICD10-I70.0) and Emphysema (ICD10-J43.9). Electronically Signed   By: Lovena Le  Clovis Riley M.D.   On: 07/25/2019 13:30   Dg Chest Portable 1 View  Result Date: 08/07/2019 CLINICAL DATA:  Elevated white count, emesis for 3 days. EXAM: PORTABLE CHEST 1 VIEW COMPARISON:  Chest radiograph dated 07/21/2019 FINDINGS: The heart size and mediastinal contours are within normal limits. Emphysematous changes are noted. Both lungs are clear. The visualized skeletal structures are unremarkable. A left subclavian approach central venous port catheter tip overlies the superior vena cava. A pigtail catheter overlies the right upper quadrant. IMPRESSION: No acute cardiopulmonary findings. Emphysema (ICD10-J43.9). Electronically Signed   By: Zerita Boers M.D.   On: 08/07/2019 15:17   Dg Chest Port 1 View  Result Date: 07/21/2019 CLINICAL DATA:  Port placement.  Metastatic bladder cancer. EXAM: PORTABLE CHEST 1 VIEW COMPARISON:  Chest x-ray dated Feb 24, 2014. FINDINGS: New left chest wall port catheter with tip in the mid SVC. The heart size and mediastinal contours are within normal limits. Chronically coarsened interstitial markings with emphysematous changes. Small area of nodularity at the left lung base. No focal consolidation, pleural effusion, or pneumothorax. No acute osseous abnormality. IMPRESSION: 1. New appropriately positioned left chest wall port catheter without complicating feature. 2. Small area of nodularity at the left lung base. Metastatic disease is not excluded. Electronically Signed   By: Titus Dubin M.D.   On: 07/21/2019  19:39   Dg Abd Portable 1 View  Result Date: 08/07/2019 CLINICAL DATA:  NG tube placement EXAM: PORTABLE ABDOMEN - 1 VIEW COMPARISON:  CT abdomen/pelvis 08/07/2019 FINDINGS: Nasogastric tube with the tip projecting over the stomach. There is ill-defined dilatation of the small bowel consistent with bowel obstruction. There is no evidence of pneumoperitoneum, portal venous gas or pneumatosis. There are no pathologic calcifications along the expected course of the ureters. Right percutaneous nephrostomy tube. The osseous structures are unremarkable. IMPRESSION: 1. Nasogastric tube with the tip projecting over the stomach. Electronically Signed   By: Kathreen Devoid   On: 08/07/2019 17:39   Dg C-arm 1-60 Min-no Report  Result Date: 07/21/2019 Fluoroscopy was utilized by the requesting physician.  No radiographic interpretation.   Ct Image Guided Fluid Drain By Catheter  Result Date: 07/12/2019 INDICATION: Right hydronephrosis secondary to bladder mass. EXAM: Right percutaneous nephrostomy. MEDICATIONS: The patient is currently admitted to the hospital. 1 g of Ancef administered. The antibiotics were administered within an appropriate time frame prior to the initiation of the procedure. ANESTHESIA/SEDATION: Fentanyl 2.0 mcg IV; Versed 25 mg IV Moderate Sedation Time:  Approximately 30 minutes The patient was continuously monitored during the procedure by the interventional radiology nurse under my direct supervision. COMPLICATIONS: None PROCEDURE: Informed written consent was obtained from the patient after a thorough discussion of the procedural risks, benefits and alternatives. All questions were addressed. Maximal Sterile Barrier Technique was utilized including caps, mask, sterile gowns, sterile gloves, sterile drape, hand hygiene and skin antiseptic. A timeout was performed prior to the initiation of the procedure. Following sterile preparation patient and CT of the patient in prone position, local  anesthesia was administered 1% lidocaine. This followed by placement 18 gauge needle into a posterior right renal calyx. This followed by placement of an Amplatz extra stiff wire. This is followed by placement of an 8 French percutaneous drainage catheter. Clear urine obtained. Percutaneous nephrostomy catheter was sutured in place and placed to bag drainage. IMPRESSION: Successful right percutaneous nephrostomy. Electronically Signed   By: Marcello Moores  Register   On: 07/12/2019 12:18    Assessment and plan-  68 year old  male metastatic bladder cancer, not yet started on chemotherapy, presented for evaluation of nausea vomiting.  Imaging work-up showed small bowel obstruction.  #Small bowel obstruction in the setting of widely metastatic bladder cancer with peritoneal carcinomatosis, no intervention per surgery given peritoneal carcinomatosis. Continue supportive care. So far, no significant improvement of SBO. Continue NG to suction. KUB was independent reviewed by me which showed persistent bowel distention and obstruction. I have discussed with Dr. Hampton Abbot via secure chat.  I agree with starting octreotide, recommend 0.1 mg SubQ twice daily- may titrate to TID.  Also recommend to start Reglan, increase dose to 57m Q8 hours.  Add dexamethasone 425mIV TID .  Nephrostomy tube in place.  He met sepsis criteria ith leukocytosis, tachycardia and tachypnea.  This is improving AKI has resolved. Agree with stopping antibiotics.  Blood cultures negative so far.    Prognosis is poor. If no further inmprovement, likely need comfort care /hospice. discussed with patient.  Palliative care service on board.  CODE STATUS, DNR/DNI.   ZhEarlie ServerMD, PhD Hematology Oncology [facility]- [phone]/28/2020

## 2019-08-10 NOTE — Care Management Important Message (Signed)
Important Message  Patient Details  Name: Johnny Martin MRN: [id number] Date of Birth: [birth year]   Medicare Important Message Given:  Yes     [name] 08/10/2019, 10:25 AM

## 2019-08-10 NOTE — Progress Notes (Signed)
[place] [facility] Day(s): 3.   Interval History: Patient seen and examined, no acute events or new complaints overnight. Patient reports he continues to feel better. No longer with abdominal pain, nausea, or emesis. He continues to pass flatus and has BM recorded. NGT output 600 ccs and appearing more bilious today. Mobilized. Palliative and oncology following as well. No other complaints.   Vital signs in last 24 hours: [min-max] current  Temp:  [97.6 F (36.4 C)-98.3 F (36.8 C)] 97.7 F (36.5 C) (10/28 0815) Pulse Rate:  [83-100] 98 (10/28 0815) Resp:  [17-22] 18 (10/28 0815) BP: (124-150)/(59-80) 150/80 (10/28 0815) SpO2:  [95 %-97 %] 97 % (10/28 0815)       Weight: 69 kg BMI (Calculated): 20.07   Intake/Output last 2 shifts:  10/27 0701 - 10/28 0700 In: 2554.5 [I.V.:2554.5] Out: [id number] [Urine:1226; Emesis/NG output:600; Stool:1]   Physical Exam:  Constitutional: alert, cooperative and no distress  HENT: normocephalic without obvious abnormality, NGT in place with more bilious output Eyes: PERRL, EOM's grossly intact and symmetric  Respiratory: breathing non-labored at rest Chest:Port in left upper chest, incision healing well with dermabond Cardiovascular: regular rate and sinus rhythm  Gastrointestinal: soft, non-tender,still with mild distension. There is a palpable firm mass in the supraumbilical region. No rebound/guarding Genitourinary: Nephrostomy tube in place, making good urine   Labs:  CBC Latest Ref Rng & Units 08/10/2019 08/09/2019 08/08/2019  WBC 4.0 - 10.5 K/uL 11.3(H) 9.8 12.1(H)  Hemoglobin 13.0 - 17.0 g/dL 12.3(L) 11.3(L) 12.0(L)  Hematocrit 39.0 - 52.0 % 37.8(L) 35.5(L) 36.6(L)  Platelets 150 - 400 K/uL 157 129(L) 153   CMP Latest Ref Rng & Units 08/10/2019 08/09/2019 08/08/2019  Glucose 70 - 99 mg/dL 80 75 97  BUN 8 - 23 mg/dL 27(H) 34(H) 43(H)  Creatinine 0.61 - 1.24 mg/dL 0.91 0.86 1.15  Sodium 135 -  145 mmol/L 145 140 136  Potassium 3.5 - 5.1 mmol/L 4.3 3.9 4.3  Chloride 98 - 111 mmol/L 115(H) 109 101  CO2 22 - 32 mmol/L 16(L) 20(L) 19(L)  Calcium 8.9 - 10.3 mg/dL 9.0 8.3(L) 7.9(L)  Total Protein 6.5 - 8.1 g/dL - - -  Total Bilirubin 0.3 - 1.2 mg/dL - - -  Alkaline Phos 38 - 126 U/L - - -  AST 15 - 41 U/L - - -  ALT 0 - 44 U/L - - -    Imaging studies:   KUB (08/10/2019) personally reviewed, still with dilated small bowel, and radiologist report reviewed:  IMPRESSION: Dilated small bowel loops indicating ongoing obstruction/ileus. Relative absence of colonic gas.  Unchanged gastric tube and right percutaneous nephrostomy.   Assessment/Plan: 68 y.o. male with clinically improved small bowel obstruction related to peritoneal carcinomatosis, although he continues to have high output NGT and dilated loops on KUB, complicated by pertinent comorbidities including metastatic bladder CA.   - Continue NGT decompression for now; closely monitor output  - NPO + IVF   - Serial abdominal examination +/- KUBs             - Appreciate oncology and palliative consults: any role for octreotide?  - Again, this is a very difficult situation and surgical intervention has low yield given likelihood of peritoneal carcinomatosis and possible frozen abdomen. Will continue conservative measures described above - pain control prn - mobilization encouraged   - Further management per primary service  All of the above findings and recommendations were discussed with the patient, and the  medical team, and all of patient's  questions were answered to his expressed satisfaction.  -- Edison Simon, PA-C [facility] 08/10/2019, 9:58 AM [phone] M-F: 7am - 4pm

## 2019-08-10 NOTE — Progress Notes (Addendum)
[facility] [facility] [place] NAME: Johnny Martin    MR#:  [id number]  DATE OF BIRTH:  [birth year]  SUBJECTIVE:   Patient continues to feel better. Abdominal pain and distention are improved. He did have one formed bowel movement this morning. No vomiting.  REVIEW OF SYSTEMS:  Review of Systems  Constitutional: Negative for chills and fever.  HENT: Negative.  Negative for congestion and sore throat.   Eyes: Negative for blurred vision and double vision.  Respiratory: Negative for cough and shortness of breath.   Cardiovascular: Negative for chest pain and palpitations.  Gastrointestinal: Negative for abdominal pain, diarrhea, nausea and vomiting.  Genitourinary: Negative for dysuria and urgency.  Musculoskeletal: Negative for back pain and neck pain.  Neurological: Negative for dizziness and headaches.  Psychiatric/Behavioral: Negative for depression. The patient is not nervous/anxious.    DRUG ALLERGIES:  No Known Allergies VITALS:  Blood pressure (!) 150/80, pulse 98, temperature 97.7 F (36.5 C), resp. rate 18, weight 69 kg, SpO2 97 %. PHYSICAL EXAMINATION:  Physical Exam  GENERAL:  Laying in the bed with no acute distress.  HEENT: Head atraumatic, normocephalic. Pupils equal, round, reactive to light and accommodation. No scleral icterus. Extraocular muscles intact. NG tube in place. NECK:  Supple, no jugular venous distention. No thyroid enlargement. LUNGS: Lungs are clear to auscultation bilaterally. No wheezes, crackles, rhonchi. No use of accessory muscles of respiration.  CARDIOVASCULAR: RRR, S1, S2 normal. No murmurs, rubs, or gallops.  ABDOMEN: Soft.  +mild distention, no tenderness to palpation.  No rebound or guarding. +BS. EXTREMITIES: No pedal edema, cyanosis, or clubbing.  NEUROLOGIC: CN 2-12 intact, no focal deficits. 5/5 muscle strength throughout all extremities. Sensation intact throughout. Gait not checked.  PSYCHIATRIC: The  patient is alert and oriented x 3.  SKIN: No obvious rash, lesion, or ulcer.  LABORATORY PANEL:  Male CBC Recent Labs  Lab 08/10/19 0629  WBC 11.3*  HGB 12.3*  HCT 37.8*  PLT 157   ------------------------------------------------------------------------------------------------------------------ Chemistries  Recent Labs  Lab 08/07/19 1115  08/10/19 0629  NA 133*   < > 145  K 4.8   < > 4.3  CL 90*   < > 115*  CO2 23   < > 16*  GLUCOSE 142*   < > 80  BUN 45*   < > 27*  CREATININE 1.45*   < > 0.91  CALCIUM 9.1   < > 9.0  AST 32  --   --   ALT 18  --   --   ALKPHOS 143*  --   --   BILITOT 1.4*  --   --    < > = values in this interval not displayed.   RADIOLOGY:  Dg Abd 1 View  Result Date: 08/10/2019 CLINICAL DATA:  68 year old male with a history of small bowel obstruction EXAM: ABDOMEN - 1 VIEW COMPARISON:  08/09/2019 FINDINGS: Similar appearance of the lower chest. Gastric tube terminates within the stomach. Unchanged appearance of the right percutaneous nephrostomy. Dilated small bowel loops within the mid abdomen. Paucity of colonic and rectal gas. Small formed stool within the rectum. No unexpected radiopaque foreign body. No displaced fracture IMPRESSION: Dilated small bowel loops indicating ongoing obstruction/ileus. Relative absence of colonic gas. Unchanged gastric tube and right percutaneous nephrostomy. Electronically Signed   By: Corrie Mckusick D.O.   On: 08/10/2019 08:09   ASSESSMENT AND PLAN:   Small bowel obstruction due to metastatic bladder cancer and peritoneal  carcinomatosis- symptoms continue to improve and patient did have 1 formed BM. -Repeat KUB this morning with persistent obstruction -Surgery following- continue NG tube -Trial of ocreotide and reglan today per surgery recs -NPO -Continue IVFs -Pain control and IV anti-emetics -Palliative care consult  Sepsis- meeting sepsis criteria on admission with leukocytosis, tachycardia, and tachypnea, but  sepsis has been ruled out with negative cultures. -Blood cultures with no growth -Urine culture with multiple species -Off antibiotics  Metastatic bladder cancer with peritoneal carcinomatosis with nephrostomy tube in place -Oncology following- planning to do palliative chemo if SBO resolves vs comfort care if SBO does not resolve -Palliative care consult  Tobacco use -Continue nicotine patch  Daughter, [name], updated via the phone.  All the records are reviewed and case discussed with Care Management/Social Worker. Management plans discussed with the patient, family and they are in agreement.  CODE STATUS: DNR  TOTAL TIME TAKING CARE OF THIS PATIENT: 38 minutes.   More than 50% of the time was spent in counseling/coordination of care: YES  POSSIBLE D/C IN 2-3 DAYS, DEPENDING ON CLINICAL CONDITION.   Berna Spare Malcom Selmer M.D on 08/10/2019 at 11:13 AM  Between 7am to 6pm - Pager - [phone]  After 6pm go to [email or web address] - Technical brewer  Hospitalists  Office  [phone]  CC: Primary care physician; Earlie Server, MD  Note: This dictation was prepared with Dragon dictation along with smaller phrase technology. Any transcriptional errors that result from this process are unintentional.

## 2019-08-11 ENCOUNTER — Encounter: Payer: Self-pay | Admitting: Oncology

## 2019-08-11 ENCOUNTER — Ambulatory Visit
Admission: RE | Admit: 2019-08-11 | Discharge: 2019-08-11 | Disposition: A | Discharge status: Home / Self Care | Payer: Medicare Other | Source: Ambulatory Visit | Attending: Oncology | Admitting: Oncology

## 2019-08-11 DIAGNOSIS — K56609 Unspecified intestinal obstruction, unspecified as to partial versus complete obstruction: Secondary | ICD-10-CM | POA: Diagnosis not present

## 2019-08-11 DIAGNOSIS — R112 Nausea with vomiting, unspecified: Secondary | ICD-10-CM | POA: Diagnosis not present

## 2019-08-11 DIAGNOSIS — C7911 Secondary malignant neoplasm of bladder: Secondary | ICD-10-CM | POA: Diagnosis not present

## 2019-08-11 DIAGNOSIS — N179 Acute kidney failure, unspecified: Secondary | ICD-10-CM | POA: Diagnosis not present

## 2019-08-11 LAB — CBC
HCT: 37.4 % — ABNORMAL LOW (ref 39.0–52.0)
HCT: 40.8 % (ref 39.0–52.0)
Hemoglobin: 12.1 g/dL — ABNORMAL LOW (ref 13.0–17.0)
Hemoglobin: 13.1 g/dL (ref 13.0–17.0)
MCH: 29.7 pg (ref 26.0–34.0)
MCH: 29.6 pg (ref 26.0–34.0)
MCHC: 32.4 g/dL (ref 30.0–36.0)
MCHC: 32.1 g/dL (ref 30.0–36.0)
MCV: 91.9 fL (ref 80.0–100.0)
MCV: 92.1 fL (ref 80.0–100.0)
Platelets: 121 10*3/uL — ABNORMAL LOW (ref 150–400)
Platelets: 144 10*3/uL — ABNORMAL LOW (ref 150–400)
RBC: 4.07 MIL/uL — ABNORMAL LOW (ref 4.22–5.81)
RBC: 4.43 MIL/uL (ref 4.22–5.81)
RDW: 15 % (ref 11.5–15.5)
RDW: 15.2 % (ref 11.5–15.5)
WBC: 7.2 10*3/uL (ref 4.0–10.5)
WBC: 7.7 10*3/uL (ref 4.0–10.5)
nRBC: 0 % (ref 0.0–0.2)
nRBC: 0 % (ref 0.0–0.2)

## 2019-08-11 LAB — BASIC METABOLIC PANEL WITH GFR
Anion gap: 16 — ABNORMAL HIGH (ref 5–15)
BUN: 26 mg/dL — ABNORMAL HIGH (ref 8–23)
CO2: 15 mmol/L — ABNORMAL LOW (ref 22–32)
Calcium: 8.6 mg/dL — ABNORMAL LOW (ref 8.9–10.3)
Chloride: 114 mmol/L — ABNORMAL HIGH (ref 98–111)
Creatinine, Ser: 0.9 mg/dL (ref 0.61–1.24)
GFR calc Af Amer: >60 mL/min
GFR calc non Af Amer: >60 mL/min
Glucose, Bld: 102 mg/dL — ABNORMAL HIGH (ref 70–99)
Potassium: 4.1 mmol/L (ref 3.5–5.1)
Sodium: 145 mmol/L (ref 135–145)

## 2019-08-11 LAB — PROTIME-INR
INR: 1.1 (ref 0.8–1.2)
Prothrombin Time: 14 s (ref 11.4–15.2)

## 2019-08-11 MED ORDER — SODIUM CHLORIDE 0.9 % IV SOLN: INTRAVENOUS | Status: DC

## 2019-08-11 NOTE — Progress Notes (Signed)
At 1545 this RN connected NG to intermittent suction for approximately fifteen minutes. Patient had zero output over that time, so NG was removed per order. Patient tolerated well.

## 2019-08-11 NOTE — Progress Notes (Signed)
[place] Hospital Day(s): 4.   Interval History: Patient seen and examined, no acute events or new complaints overnight. Patient reports he is feeling better this morning. He denied any abdominal pain, nausea, fever, or chills. He continues to endorse flatus and BMs are recorded. NGT output recorded in last 24 hours is 550, but apparently canister was changed at 1300 and only has had about 150 ccs of bilious output. Started on Octreotide, Reglan, and Dexamethasone yesterday. No other complaints.    Vital signs in last 24 hours: [min-max] current  Temp:  [97.7 F (36.5 C)-98.3 F (36.8 C)] 98 F (36.7 C) (10/29 0343) Pulse Rate:  [83-98] 91 (10/29 0343) Resp:  [18-20] 20 (10/29 0343) BP: (130-150)/(67-80) 140/79 (10/29 0343) SpO2:  [95 %-98 %] 98 % (10/29 0343)       Weight: 69 kg BMI (Calculated): 20.07   Intake/Output last 2 shifts:  10/28 0701 - 10/29 0700 In: -  Out: 1350 [Urine:800; Emesis/NG output:550]   Physical Exam:  Constitutional: alert, cooperative and no distress  HENT: normocephalic without obvious abnormality, NGT in place with more bilious output Eyes: PERRL, EOM's grossly intact and symmetric  Respiratory: breathing non-labored at rest Chest:Port in left upper chest, incision healing well with dermabond Cardiovascular: regular rate and sinus rhythm  Gastrointestinal: soft, non-tender,still with mild distension. There is a palpable firm mass in the supraumbilical region. No rebound/guarding Genitourinary: Nephrostomy tube in place, making good urine   Labs:  CBC Latest Ref Rng & Units 08/11/2019 08/10/2019 08/09/2019  WBC 4.0 - 10.5 K/uL 7.2 11.3(H) 9.8  Hemoglobin 13.0 - 17.0 g/dL 12.1(L) 12.3(L) 11.3(L)  Hematocrit 39.0 - 52.0 % 37.4(L) 37.8(L) 35.5(L)  Platelets 150 - 400 K/uL 121(L) 157 129(L)   CMP Latest Ref Rng & Units 08/11/2019 08/10/2019 08/09/2019  Glucose 70 - 99 mg/dL 102(H) 80 75  BUN 8 - 23 mg/dL  26(H) 27(H) 34(H)  Creatinine 0.61 - 1.24 mg/dL 0.90 0.91 0.86  Sodium 135 - 145 mmol/L 145 145 140  Potassium 3.5 - 5.1 mmol/L 4.1 4.3 3.9  Chloride 98 - 111 mmol/L 114(H) 115(H) 109  CO2 22 - 32 mmol/L 15(L) 16(L) 20(L)  Calcium 8.9 - 10.3 mg/dL 8.6(L) 9.0 8.3(L)  Total Protein 6.5 - 8.1 g/dL - - -  Total Bilirubin 0.3 - 1.2 mg/dL - - -  Alkaline Phos 38 - 126 U/L - - -  AST 15 - 41 U/L - - -  ALT 0 - 44 U/L - - -    Imaging studies: No new pertinent imaging studies   Assessment/Plan:  68 y.o. male with clinically improving small bowel obstruction, with decrease in NGT output, related to peritoneal carcinomatosis, although he continues to have high output NGT and dilated loops on KUB, complicated by pertinent comorbidities including metastatic bladder CA.   - Will do NGT clamping trial this afternoon; check residuals at 1530; if less than 150 ccs we will remove NGT and initiate diet    - Serial abdominal examination +/- KUBs - Appreciate oncology and palliative consults             - Again, this is a very difficult situation and surgical intervention has low yield given likelihood of peritoneal carcinomatosis and possible frozen abdomen. Will continue conservative measures described above - pain control prn - mobilization encouraged              - Further management per primary service  All of the above  findings and recommendations were discussed with the patient, and the medical team, and all of patient's questions were answered to his expressed satisfaction.  -- Edison Simon, PA-C [facility] 08/11/2019, 6:55 AM [phone] M-F: 7am - 4pm

## 2019-08-11 NOTE — Progress Notes (Signed)
Hematology/Oncology Progress Note [facility] [phone] [phone]  Patient Care Team: Earlie Server, MD as PCP - General (Oncology)   Name of the patient: Johnny Martin  [id number]  [birth year]  Date of visit: 08/11/19   INTERVAL HISTORY-  Patient was seen at the bedside.  Patient states that he is feeling better today.  Had 2 bowel movement this morning. No nausea, vomiting or abdominal pain. NG suction has been stopped and NG tube clamped.    Review of systems-  Review of Systems  Constitutional: Positive for appetite change, fatigue and unexpected weight change.  HENT:   Negative for hearing loss.   Respiratory: Negative for cough and hemoptysis.   Cardiovascular: Negative for chest pain.  Gastrointestinal: Positive for abdominal distention. Negative for abdominal pain.  Genitourinary: Negative for dysuria and frequency.   Neurological: Negative for light-headedness.  Hematological: Negative for adenopathy.  Psychiatric/Behavioral: Negative for confusion. The patient is not nervous/anxious.      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[place])    Protein-calorie malnutrition, severe 08/08/2019   Sepsis ([place])    Nausea and vomiting    Dehydration    SBO (small bowel obstruction) ([place]) 08/07/2019   Goals of care, counseling/discussion 07/21/2019   Bladder cancer ([place]) 07/21/2019   Drug-induced constipation    Bladder mass 07/11/2019   Hydronephrosis due to obstruction of ureter 07/11/2019   Gross hematuria    Peritoneal carcinomatosis ([facility])    Mass of left side of neck    AKI (acute kidney injury) ([place])      Past Medical History:  Diagnosis Date   Bladder cancer ([place]) 07/21/2019   Cancer ([place])    Bladder cancer     Past Surgical History:  Procedure Laterality Date   BLADDER SURGERY      BRAIN SURGERY     plate in head per patient since childhood    PORTACATH PLACEMENT Left 07/21/2019   Procedure: INSERTION PORT-A-CATH;  Surgeon: Olean Ree, MD;  Location: [facility] ORS;  Service: General;  Laterality: Left;    Social History   Socioeconomic History   Marital status: Divorced    Spouse name: Not on file   Number of children: Not on file   Years of education: Not on file   Highest education level: Not on file  Occupational History   Not on file  Social Needs   Financial resource strain: Not on file   Food insecurity    Worry: Not on file    Inability: Not on file   Transportation needs    Medical: Not on file    Non-medical: Not on file  Tobacco Use   Smoking status: Former Smoker    Packs/day: 0.50    Quit date: 07/13/2019    Years since quitting: 0.0   Smokeless tobacco: Never Used  Substance and Sexual Activity   Alcohol use: Not Currently    Frequency: Never    Comment: quit 3 years ago    Drug use: Never   Sexual activity: Not on file  Lifestyle   Physical activity    Days per week: Not on file    Minutes per session: Not on file   Stress: Not on file  Relationships   Social connections    Talks on phone: Not on file    Gets together: Not on file  Attends religious service: Not on file    Active member of club or organization: Not on file    Attends meetings of clubs or organizations: Not on file    Relationship status: Not on file   Intimate partner violence    Fear of current or ex partner: Not on file    Emotionally abused: Not on file    Physically abused: Not on file    Forced sexual activity: Not on file  Other Topics Concern   Not on file  Social History Narrative   Lives at home with daughter and son in [place].     Family History  Problem Relation Age of Onset   Cervical cancer Sister    Kidney failure Mother    Stroke Mother    Alzheimer's disease Father      Current Facility-Administered Medications:     0.9 %  sodium chloride infusion, , Intravenous, Continuous, Harrie Foreman, MD, Last Rate: 125 mL/hr at 08/11/19 0858   0.9 %  sodium chloride infusion, , Intravenous, Continuous, Monia Sabal, PA-C   acetaminophen (TYLENOL) tablet 650 mg, 650 mg, Oral, Q6H PRN **OR** acetaminophen (TYLENOL) suppository 650 mg, 650 mg, Rectal, Q6H PRN, Harrie Foreman, MD   Chlorhexidine Gluconate Cloth 2 % PADS 6 each, 6 each, Topical, Daily, Harrie Foreman, MD, 6 each at 08/11/19 1351   dexamethasone (DECADRON) injection 4 mg, 4 mg, Intravenous, Q8H, Earlie Server, MD, 4 mg at 08/11/19 1316   docusate sodium (COLACE) capsule 100 mg, 100 mg, Oral, BID, Harrie Foreman, MD, Stopped at 08/11/19 0847   enoxaparin (LOVENOX) injection 40 mg, 40 mg, Subcutaneous, Q24H, Harrie Foreman, MD, 40 mg at 08/10/19 2125   HYDROmorphone (DILAUDID) injection 0.5 mg, 0.5 mg, Intravenous, Q3H PRN, Mayo, Pete Pelt, MD, 0.5 mg at 08/11/19 0847   metoCLOPramide (REGLAN) injection 10 mg, 10 mg, Intravenous, Q8H, Earlie Server, MD, 10 mg at 08/11/19 1319   metoprolol tartrate (LOPRESSOR) injection 5 mg, 5 mg, Intravenous, Q6H, Mayo, Pete Pelt, MD, 5 mg at 08/11/19 0539   nicotine (NICODERM CQ - dosed in mg/24 hours) patch 21 mg, 21 mg, Transdermal, Daily, Harrie Foreman, MD, 21 mg at 08/10/19 2128   octreotide (SANDOSTATIN) injection 100 mcg, 100 mcg, Subcutaneous, Q12H, Piscoya, Jose, MD, 100 mcg at 08/11/19 0003   ondansetron (ZOFRAN) tablet 4 mg, 4 mg, Oral, Q6H PRN **OR** ondansetron (ZOFRAN) injection 4 mg, 4 mg, Intravenous, Q6H PRN, Harrie Foreman, MD   pantoprazole (PROTONIX) injection 40 mg, 40 mg, Intravenous, Q24H, Mayo, Pete Pelt, MD, 40 mg at 08/11/19 1148   polyethylene glycol (MIRALAX / GLYCOLAX) packet 17 g, 17 g, Oral, Daily PRN, Harrie Foreman, MD   prochlorperazine (COMPAZINE) injection 10 mg, 10 mg, Intravenous, Q6H PRN, Harrie Foreman, MD   senna-docusate (Senokot-S)  tablet 2 tablet, 2 tablet, Oral, Daily, Harrie Foreman, MD, Stopped at 08/11/19 0848   Physical exam:  Vitals:   08/10/19 2016 08/11/19 0011 08/11/19 0343 08/11/19 0850  BP: 130/67 (!) 145/77 140/79 135/74  Pulse: 83 86 91 96  Resp: 20  20 16   Temp: 98.2 F (36.8 C) 98.3 F (36.8 C) 98 F (36.7 C) 98.6 F (37 C)  TempSrc: Oral Oral Oral Oral  SpO2: 97% 96% 98% 99%  Weight:       Physical Exam  Constitutional: He is oriented to person, place, and time. No distress.  Cachectic  HENT:  Head: Normocephalic and atraumatic.  Mouth/Throat: No  oropharyngeal exudate.  NG tube in place  Eyes: Pupils are equal, round, and reactive to light. EOM are normal. No scleral icterus.  Neck: Normal range of motion. Neck supple.  Cardiovascular: Normal rate and regular rhythm.  No murmur heard. Pulmonary/Chest: Effort normal. No respiratory distress. He has no rales. He exhibits no tenderness.  Abdominal: Soft. He exhibits distension. There is no abdominal tenderness.  Sluggish bowel sounds  Nephrostomy tube  Musculoskeletal: Normal range of motion.        General: No edema.  Neurological: He is alert and oriented to person, place, and time. No cranial nerve deficit. He exhibits normal muscle tone. Coordination normal.  Skin: Skin is warm and dry. He is not diaphoretic. No erythema.  Psychiatric: Affect normal.      CMP Latest Ref Rng & Units 08/11/2019  Glucose 70 - 99 mg/dL 102(H)  BUN 8 - 23 mg/dL 26(H)  Creatinine 0.61 - 1.24 mg/dL 0.90  Sodium 135 - 145 mmol/L 145  Potassium 3.5 - 5.1 mmol/L 4.1  Chloride 98 - 111 mmol/L 114(H)  CO2 22 - 32 mmol/L 15(L)  Calcium 8.9 - 10.3 mg/dL 8.6(L)  Total Protein 6.5 - 8.1 g/dL -  Total Bilirubin 0.3 - 1.2 mg/dL -  Alkaline Phos 38 - 126 U/L -  AST 15 - 41 U/L -  ALT 0 - 44 U/L -   CBC Latest Ref Rng & Units 08/11/2019  WBC 4.0 - 10.5 K/uL 7.7  Hemoglobin 13.0 - 17.0 g/dL 13.1  Hematocrit 39.0 - 52.0 % 40.8  Platelets 150 - 400  K/uL 144(L)    RADIOGRAPHIC STUDIES: I have personally reviewed the radiological images as listed and agreed with the findings in the report. Dg Abd 1 View  Result Date: 08/10/2019 CLINICAL DATA:  68 year old male with a history of small bowel obstruction EXAM: ABDOMEN - 1 VIEW COMPARISON:  08/09/2019 FINDINGS: Similar appearance of the lower chest. Gastric tube terminates within the stomach. Unchanged appearance of the right percutaneous nephrostomy. Dilated small bowel loops within the mid abdomen. Paucity of colonic and rectal gas. Small formed stool within the rectum. No unexpected radiopaque foreign body. No displaced fracture IMPRESSION: Dilated small bowel loops indicating ongoing obstruction/ileus. Relative absence of colonic gas. Unchanged gastric tube and right percutaneous nephrostomy. Electronically Signed   By: Corrie Mckusick D.O.   On: 08/10/2019 08:09   Dg Abd 1 View  Result Date: 08/09/2019 CLINICAL DATA:  Small bowel obstruction EXAM: ABDOMEN - 1 VIEW COMPARISON:  08/07/2019 FINDINGS: NG tube tip is within the fundus of the stomach, unchanged. Right side nephrostomy catheter again noted, unchanged. There are dilated small bowel loops compatible with small bowel obstruction, slightly progressed since prior study. No organomegaly or free air. IMPRESSION: Small-bowel dilatation slightly progressed since prior study. Findings compatible with continued small bowel obstruction. Electronically Signed   By: Rolm Baptise M.D.   On: 08/09/2019 09:42   Ct Guided Needle Placement  Result Date: 07/13/2019 INDICATION: History of bladder cancer, now with peritoneal masses worrisome for metastatic disease. Please perform CT-guided biopsy for tissue diagnostic purposes. EXAM: CT GUIDANCE NEEDLE PLACEMENT COMPARISON:  CT abdomen and pelvis - 07/11/2019 MEDICATIONS: None. ANESTHESIA/SEDATION: Fentanyl 100 mcg IV; Versed 2 mg IV Sedation time: 10 minutes; The patient was continuously monitored during  the procedure by the interventional radiology nurse under my direct supervision. CONTRAST:  None. COMPLICATIONS: None immediate. PROCEDURE: Informed consent was obtained from the patient following an explanation of the procedure, risks, benefits and alternatives.  A time out was performed prior to the initiation of the procedure. The patient was positioned supine on the CT table and a limited CT was performed for procedural planning demonstrating unchanged size and appearance of the infiltrative mass involving the left pericolic gutter with dominant component measuring at least 12.3 x 6.2 cm (image 26, series 2). The procedure was planned. The operative site was prepped and draped in the usual sterile fashion. Appropriate trajectory was confirmed with a 22 gauge spinal needle after the adjacent tissues were anesthetized with 1% Lidocaine with epinephrine. Under intermittent CT guidance, a 17 gauge coaxial needle was advanced into the peripheral aspect of the mass. Appropriate positioning was confirmed and 5 core needle biopsy samples were obtained with an 18 gauge core needle biopsy device. The co-axial needle was removed following the administration of a Gel-Foam slurry and superficial hemostasis was achieved with manual compression. A limited postprocedural CT was negative for hemorrhage or additional complication. A dressing was placed. The patient tolerated the procedure well without immediate postprocedural complication. IMPRESSION: Technically successful CT guided core needle biopsy of infiltrative mass within the left pericolic gutter. Electronically Signed   By: Sandi Mariscal M.D.   On: 07/13/2019 11:35   Ct Abdomen Pelvis W Contrast  Result Date: 08/07/2019 CLINICAL DATA:  Emesis for 2 days, history of bladder cancer EXAM: CT ABDOMEN AND PELVIS WITH CONTRAST TECHNIQUE: Multidetector CT imaging of the abdomen and pelvis was performed using the standard protocol following bolus administration of intravenous  contrast. CONTRAST:  142mL OMNIPAQUE IOHEXOL 300 MG/ML  SOLN COMPARISON:  PET-CT, 07/25/2019 FINDINGS: Lower chest: No acute abnormality. Metastatic nodules in the partially included lingula (series 4, image 9). Emphysema. Hepatobiliary: No solid liver abnormality is seen. No gallstones, gallbladder wall thickening, or biliary dilatation. Pancreas: Unremarkable. No pancreatic ductal dilatation or surrounding inflammatory changes. Spleen: Normal in size without significant abnormality. Adrenals/Urinary Tract: Adrenal glands are unremarkable. Right-sided percutaneous nephrostomy with formed pigtail in the right renal pelvis. No hydronephrosis. Redemonstrated, bulky, predominantly endoluminal bladder mass and soft tissue nodules. Stomach/Bowel: There is redemonstrated, extensive, bulky, metastatic tissue involving the omentum, multiple loops of small bowel, and the anterior abdominal wall. The stomach and proximal to mid small bowel are diffusely fluid distended, with an abrupt transition point in the central abdomen abutting metastatic soft tissue (series 2, image 59) and decompression of the distal small bowel. Vascular/Lymphatic: Aortic atherosclerosis. Multiple enlarged retroperitoneal, iliac, and pelvic sidewall lymph nodes as seen on prior examination. Reproductive: No mass or other significant abnormality. Other: No abdominal wall hernia or abnormality. No abdominopelvic ascites. Musculoskeletal: No acute or significant osseous findings. IMPRESSION: 1. There is redemonstrated, extensive, bulky, metastatic tissue involving the omentum, multiple loops of small bowel, and the anterior abdominal wall, not significant changed in appearance compared to prior PET-CT examination dated 07/25/2019. The stomach and proximal to mid small bowel are diffusely fluid distended, with an abrupt transition point in the central abdomen abutting metastatic soft tissue (series 2, image 59) and decompression of the distal small bowel.  Findings are consistent with small bowel obstruction. 2. Other findings of primary bladder malignancy and advanced metastatic disease as detailed above, not significantly changed in comparison to PET-CT dated 07/25/2019. 3. Aortic Atherosclerosis (ICD10-I70.0) and Emphysema (ICD10-J43.9). Electronically Signed   By: Eddie Candle M.D.   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 EXAM: NUCLEAR MEDICINE PET  SKULL BASE TO THIGH TECHNIQUE: 8.795 mCi F-18 FDG was injected intravenously. Full-ring PET imaging was performed from the skull base to thigh after the radiotracer. CT data was obtained and used for attenuation correction and anatomic localization. Fasting blood glucose: 93 mg/dl COMPARISON:  None. FINDINGS: Mediastinal blood pool activity: SUV max 1.36 . Liver activity: SUV max NA NECK: Enlarged and hypermetabolic left level 2 lymph nodes. Nodal mass measures 3.4 by 1.9 cm and has an SUV max of 7.06 Incidental CT findings: none CHEST: Hypermetabolic right paratracheal, subcarinal and bilateral hilar lymph nodes are identified. Right paratracheal node measures 1.4 cm and has an SUV max of 10.38. Index subcarinal node measures 1.1 cm and has an SUV max of 8.93. Index left hilar node measures 1.2 cm and has an SUV max of 6.41. Right hilar lymph node measures 1.2 cm and has an SUV max 8.66. Right CP angle lymph node is FDG avid measuring 1.4 cm with SUV max of 7.57. Bilateral FDG avid pulmonary nodules. Right lower lobe lung nodule measures 1 cm and has an SUV max of 1.88. Hypermetabolic subpleural nodule in the lingula measures 1 cm and has an SUV max of 6.6. Incidental CT findings: Advanced changes of emphysema. Aortic atherosclerosis. ABDOMEN/PELVIS: There is a few small foci of FDG uptake above liver background activity without definite CT correlate, equivocal for metastasis. For  example, within segment 8 there is an area of increased uptake within SUV max of 2.67. This is compared with background liver activity of 2.15. No abnormal uptake within the pancreas, spleen or adrenal glands. Soft tissue mass involving the bladder measures 9 cm within SUV max of 15.7. Hypermetabolic right retrocrural lymph node measures 1 cm and has an SUV max of 7.13. Multiple hypermetabolic retroperitoneal and bilateral iliac and inguinal lymph nodes identified. Index left periaortic node measures 1.1 cm within SUV max of 8.01. Index retrocaval lymph node measures 1.3 cm and has an SUV max of 6.25. Left common iliac node measures 1.3 cm and has an SUV max of 1.2. Right common iliac node measures 1.5 cm and has an SUV max of 6.4. Left external iliac node measures 1.8 cm with SUV max of 8.79. Left inguinal node measures 0.7 cm and has an SUV max of 4.87. Left pericolic gutter soft tissue mass measures 12.4 cm and has SUV max of 11.9. Right lateral abdominal mass measures 8 cm and has SUV max of 12.07. Large mass within the ventral abdomen measures 16.6 cm within SUV max of 11.36. Small FDG avid peritoneal implants are identified along the surface of the liver. Incidental CT findings: Right-sided percutaneous nephrostomy tube is in place. No hydronephrosis identified bilaterally. Aortic atherosclerosis. SKELETON: No focal hypermetabolic activity to suggest skeletal metastasis. Incidental CT findings: none IMPRESSION: 1. Large mass within the urinary bladder is identified and is hypermetabolic concerning for primary urothelial neoplasm. 2. Hypermetabolic left cervical, thoracic, abdominal, and pelvic lymph nodes consistent with metastatic adenopathy. 3. Extensive, bulky, hypermetabolic peritoneal disease within the abdomen and pelvis. 4. Bilateral pulmonary nodules concerning for metastatic disease. 5. A few small foci (without CT correlate) of mild FDG uptake are noted in the liver which are equivocal for  metastasis. 6. No findings to suggest osseous metastasis. 7. Aortic Atherosclerosis (ICD10-I70.0) and Emphysema (ICD10-J43.9). Electronically Signed   By: Kerby Moors M.D.   On: 07/25/2019 13:30   Dg Chest Portable 1 View  Result Date: 08/07/2019 CLINICAL DATA:  Elevated white count, emesis for 3 days. EXAM: PORTABLE CHEST  1 VIEW COMPARISON:  Chest radiograph dated 07/21/2019 FINDINGS: The heart size and mediastinal contours are within normal limits. Emphysematous changes are noted. Both lungs are clear. The visualized skeletal structures are unremarkable. A left subclavian approach central venous port catheter tip overlies the superior vena cava. A pigtail catheter overlies the right upper quadrant. IMPRESSION: No acute cardiopulmonary findings. Emphysema (ICD10-J43.9). Electronically Signed   By: Zerita Boers M.D.   On: 08/07/2019 15:17   Dg Chest Port 1 View  Result Date: 07/21/2019 CLINICAL DATA:  Port placement.  Metastatic bladder cancer. EXAM: PORTABLE CHEST 1 VIEW COMPARISON:  Chest x-ray dated Feb 24, 2014. FINDINGS: New left chest wall port catheter with tip in the mid SVC. The heart size and mediastinal contours are within normal limits. Chronically coarsened interstitial markings with emphysematous changes. Small area of nodularity at the left lung base. No focal consolidation, pleural effusion, or pneumothorax. No acute osseous abnormality. IMPRESSION: 1. New appropriately positioned left chest wall port catheter without complicating feature. 2. Small area of nodularity at the left lung base. Metastatic disease is not excluded. Electronically Signed   By: Titus Dubin M.D.   On: 07/21/2019 19:39   Dg Abd Portable 1 View  Result Date: 08/07/2019 CLINICAL DATA:  NG tube placement EXAM: PORTABLE ABDOMEN - 1 VIEW COMPARISON:  CT abdomen/pelvis 08/07/2019 FINDINGS: Nasogastric tube with the tip projecting over the stomach. There is ill-defined dilatation of the small bowel consistent  with bowel obstruction. There is no evidence of pneumoperitoneum, portal venous gas or pneumatosis. There are no pathologic calcifications along the expected course of the ureters. Right percutaneous nephrostomy tube. The osseous structures are unremarkable. IMPRESSION: 1. Nasogastric tube with the tip projecting over the stomach. Electronically Signed   By: Kathreen Devoid   On: 08/07/2019 17:39   Dg C-arm 1-60 Min-no Report  Result Date: 07/21/2019 Fluoroscopy was utilized by the requesting physician.  No radiographic interpretation.    Assessment and plan-  68 year old male metastatic bladder cancer, not yet started on chemotherapy, presented for evaluation of nausea vomiting.  Imaging work-up showed small bowel obstruction.  #Small bowel obstruction in the setting of widely metastatic bladder cancer with peritoneal carcinomatosis, no intervention per surgery given peritoneal carcinomatosis. Continue supportive care. Clinically improving. Clamping trial this afternoon. Continue octreotide 0.1 mg subQ twice daily. Reglan 10 mg every 8 hours dexamethasone 4 mg IV 3 times daily. Additional gastrografin oral bolus 17ml can be considered if no dramatic improvement.   Prognosis is poor. If no further inmprovement, likely need comfort care /hospice. discussed with patient.  Palliative care service on board. Called daughter [name] and updated her.   CODE STATUS, DNR/DNI.   Earlie Server, MD, PhD Hematology Oncology [facility] at [facility] Pager- [phone] 08/11/2019

## 2019-08-11 NOTE — Progress Notes (Signed)
[place] [facility] [place] NAME: Johnny Martin    MR#:  [id number]  DATE OF BIRTH:  12/27/1950  SUBJECTIVE:   Patient states he is feeling good this morning. He had two BMs this morning. He denies any nausea, vomiting, or abdominal pain.  REVIEW OF SYSTEMS:  Review of Systems  Constitutional: Negative for chills and fever.  HENT: Negative.  Negative for congestion and sore throat.   Eyes: Negative for blurred vision and double vision.  Respiratory: Negative for cough and shortness of breath.   Cardiovascular: Negative for chest pain and palpitations.  Gastrointestinal: Negative for abdominal pain, diarrhea, nausea and vomiting.  Genitourinary: Negative for dysuria and urgency.  Musculoskeletal: Negative for back pain and neck pain.  Neurological: Negative for dizziness and headaches.  Psychiatric/Behavioral: Negative for depression. The patient is not nervous/anxious.    DRUG ALLERGIES:  No Known Allergies VITALS:  Blood pressure 135/74, pulse 96, temperature 98.6 F (37 C), temperature source Oral, resp. rate 16, weight 69 kg, SpO2 99 %. PHYSICAL EXAMINATION:  Physical Exam  GENERAL:  Laying in the bed with no acute distress. Chronically-ill appearing. HEENT: Head atraumatic, normocephalic. Pupils equal, round, reactive to light and accommodation. No scleral icterus. Extraocular muscles intact. NG tube in place. NECK:  Supple, no jugular venous distention. No thyroid enlargement. LUNGS: Lungs are clear to auscultation bilaterally. No wheezes, crackles, rhonchi. No use of accessory muscles of respiration.  CARDIOVASCULAR: RRR, S1, S2 normal. No murmurs, rubs, or gallops.  ABDOMEN: Soft.  +mild distention, no tenderness to palpation.  No rebound or guarding. +BS. EXTREMITIES: No pedal edema, cyanosis, or clubbing.  NEUROLOGIC: CN 2-12 intact, no focal deficits. +global weakness. Sensation intact throughout. Gait not checked.  PSYCHIATRIC: The  patient is alert and oriented x 3.  SKIN: No obvious rash, lesion, or ulcer.  LABORATORY PANEL:  Male CBC Recent Labs  Lab 08/11/19 0849  WBC 7.7  HGB 13.1  HCT 40.8  PLT 144*   ------------------------------------------------------------------------------------------------------------------ Chemistries  Recent Labs  Lab 08/07/19 1115  08/11/19 0429  NA 133*   < > 145  K 4.8   < > 4.1  CL 90*   < > 114*  CO2 23   < > 15*  GLUCOSE 142*   < > 102*  BUN 45*   < > 26*  CREATININE 1.45*   < > 0.90  CALCIUM 9.1   < > 8.6*  AST 32  --   --   ALT 18  --   --   ALKPHOS 143*  --   --   BILITOT 1.4*  --   --    < > = values in this interval not displayed.   RADIOLOGY:  No results found. ASSESSMENT AND PLAN:   Small bowel obstruction due to metastatic bladder cancer and peritoneal carcinomatosis- symptoms continue to improve and patient did have 1 formed BM. -Surgery following- plan for clamping trial this afternoon -Continue octreotide, reglan, and dexamethasone -NPO -Continue IVFs -Pain control and IV anti-emetics -Palliative care following  Sepsis- meeting sepsis criteria on admission with leukocytosis, tachycardia, and tachypnea, but sepsis has been ruled out with negative cultures. 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  All the records are reviewed and case discussed with Care Management/Social Worker. Management plans discussed with the patient, family and they are in agreement.  CODE STATUS: DNR  TOTAL TIME TAKING CARE OF THIS PATIENT: 36 minutes.   More than 50% of the time was spent in counseling/coordination of  care: YES  POSSIBLE D/C IN 2-3 DAYS, DEPENDING ON CLINICAL CONDITION.   Berna Spare Mayo M.D on 08/11/2019 at 12:24 PM  Between 7am to 6pm - Pager [phone]  After 6pm go to [email or web address] - [employer] [facility]  Office  [phone]  CC: Primary care physician; Earlie Server, MD  Note: This dictation was prepared with Dragon dictation along with smaller phrase technology. Any transcriptional errors that result from this process are unintentional.

## 2019-08-12 DIAGNOSIS — E43 Unspecified severe protein-calorie malnutrition: Secondary | ICD-10-CM | POA: Diagnosis not present

## 2019-08-12 DIAGNOSIS — C7911 Secondary malignant neoplasm of bladder: Secondary | ICD-10-CM | POA: Diagnosis not present

## 2019-08-12 DIAGNOSIS — N179 Acute kidney failure, unspecified: Secondary | ICD-10-CM | POA: Diagnosis not present

## 2019-08-12 DIAGNOSIS — K56609 Unspecified intestinal obstruction, unspecified as to partial versus complete obstruction: Secondary | ICD-10-CM | POA: Diagnosis not present

## 2019-08-12 DIAGNOSIS — Z515 Encounter for palliative care: Secondary | ICD-10-CM | POA: Diagnosis not present

## 2019-08-12 LAB — BASIC METABOLIC PANEL
Anion gap: 15 (ref 5–15)
BUN: 26 mg/dL — ABNORMAL HIGH (ref 8–23)
CO2: 16 mmol/L — ABNORMAL LOW (ref 22–32)
Calcium: 8.5 mg/dL — ABNORMAL LOW (ref 8.9–10.3)
Chloride: 112 mmol/L — ABNORMAL HIGH (ref 98–111)
Creatinine, Ser: 0.81 mg/dL (ref 0.61–1.24)
GFR calc Af Amer: >60 mL/min (ref >60)
GFR calc non Af Amer: >60 mL/min (ref >60)
Glucose, Bld: 132 mg/dL — ABNORMAL HIGH (ref 70–99)
Potassium: 4.1 mmol/L (ref 3.5–5.1)
Sodium: 143 mmol/L (ref 135–145)

## 2019-08-12 LAB — CULTURE, BLOOD (ROUTINE X 2)
Culture: NO GROWTH
Culture: NO GROWTH

## 2019-08-12 LAB — CBC
HCT: 37.2 % — ABNORMAL LOW (ref 39.0–52.0)
Hemoglobin: 12.1 g/dL — ABNORMAL LOW (ref 13.0–17.0)
MCH: 29.4 pg (ref 26.0–34.0)
MCHC: 32.5 g/dL (ref 30.0–36.0)
MCV: 90.5 fL (ref 80.0–100.0)
Platelets: 134 10*3/uL — ABNORMAL LOW (ref 150–400)
RBC: 4.11 MIL/uL — ABNORMAL LOW (ref 4.22–5.81)
RDW: 15.3 % (ref 11.5–15.5)
WBC: 11.7 10*3/uL — ABNORMAL HIGH (ref 4.0–10.5)
nRBC: 0 % (ref 0.0–0.2)

## 2019-08-12 MED ORDER — METOPROLOL TARTRATE 25 MG PO TABS
25.0000 mg | ORAL_TABLET | Freq: Two times a day (BID) | ORAL | Status: DC
  Administered 2019-08-12 – 2019-08-18 (×12): 25 mg via ORAL
  Filled 2019-08-12 (×12): qty 1

## 2019-08-12 MED ORDER — PANTOPRAZOLE SODIUM 40 MG PO TBEC
40.0000 mg | DELAYED_RELEASE_TABLET | Freq: Every day | ORAL | Status: DC
  Administered 2019-08-12 – 2019-08-18 (×7): 40 mg via ORAL
  Filled 2019-08-12 (×7): qty 1

## 2019-08-12 MED ORDER — DEXAMETHASONE SODIUM PHOSPHATE 4 MG/ML IJ SOLN
4.0000 mg | Freq: Two times a day (BID) | INTRAMUSCULAR | Status: DC
  Administered 2019-08-13 – 2019-08-18 (×11): 4 mg via INTRAVENOUS
  Filled 2019-08-12 (×11): qty 1

## 2019-08-12 MED ORDER — PANTOPRAZOLE SODIUM 40 MG PO TBEC: 40.0000 mg | DELAYED_RELEASE_TABLET | Freq: Every day | ORAL | Status: DC

## 2019-08-12 NOTE — Progress Notes (Signed)
Hematology/Oncology Progress Note [facility] Telephone:([phone] [phone]  Patient Care Team: Earlie Server, MD as PCP - General (Oncology)   Name of the patient: Johnny Martin  [id number]  1951-04-21  Date of visit: 08/12/19   INTERVAL HISTORY-  Patient was seen at the bedside.  NG tube has been discontinued as he passed the clamping trial. He tolerated clear liquid diet for dinner yesterday.  And breakfast this morning.  Denies abdominal pain, nausea or vomiting. Had bowel movements continue to pass flatus.   Review of systems-  Review of Systems  Constitutional: Positive for appetite change, fatigue and unexpected weight change. Negative for chills and fever.  HENT:   Negative for hearing loss and voice change.   Eyes: Negative for eye problems and icterus.  Respiratory: Negative for chest tightness, cough, hemoptysis and shortness of breath.   Cardiovascular: Negative for chest pain and leg swelling.  Gastrointestinal: Positive for abdominal distention. Negative for abdominal pain.  Endocrine: Negative for hot flashes.  Genitourinary: Negative for difficulty urinating, dysuria and frequency.   Musculoskeletal: Negative for arthralgias.  Skin: Negative for itching and rash.  Neurological: Negative for light-headedness and numbness.  Hematological: Negative for adenopathy. Does not bruise/bleed easily.  Psychiatric/Behavioral: Negative for confusion. The patient is not nervous/anxious.      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[place])    Protein-calorie malnutrition, severe 08/08/2019   Sepsis ([place])    Nausea and vomiting    Dehydration    SBO (small bowel obstruction) ([place]) 08/07/2019   Goals of care, counseling/discussion 07/21/2019   Bladder cancer ([place]) 07/21/2019   Drug-induced constipation    Bladder mass  07/11/2019   Hydronephrosis due to obstruction of ureter 07/11/2019   Gross hematuria    Peritoneal carcinomatosis ([facility])    Mass of left side of neck    AKI (acute kidney injury) ([place])      Past Medical History:  Diagnosis Date   Bladder cancer ([place]) 07/21/2019   Cancer ([place])    Bladder cancer     Past Surgical History:  Procedure Laterality Date   BLADDER SURGERY     BRAIN SURGERY     plate in head per patient since childhood    PORTACATH PLACEMENT Left 07/21/2019   Procedure: INSERTION PORT-A-CATH;  Surgeon: Olean Ree, MD;  Location: [facility] ORS;  Service: General;  Laterality: Left;    Social History   Socioeconomic History   Marital status: Divorced    Spouse name: Not on file   Number of children: Not on file   Years of education: Not on file   Highest education level: Not on file  Occupational History   Not on file  Social Needs   Financial resource strain: Not on file   Food insecurity    Worry: Not on file    Inability: Not on file   Transportation needs    Medical: Not on file    Non-medical: Not on file  Tobacco Use   Smoking status: Former Smoker    Packs/day: 0.50    Quit date: 07/13/2019    Years since quitting: 0.0   Smokeless tobacco: Never Used  Substance and Sexual Activity   Alcohol use: Not Currently    Frequency: Never    Comment: quit 3 years ago    Drug use: Never   Sexual activity: Not  on file  Lifestyle   Physical activity    Days per week: Not on file    Minutes per session: Not on file   Stress: Not on file  Relationships   Social connections    Talks on phone: Not on file    Gets together: Not on file    Attends religious service: Not on file    Active member of club or organization: Not on file    Attends meetings of clubs or organizations: Not on file    Relationship status: Not on file   Intimate partner violence    Fear of current or ex partner: Not on file    Emotionally abused: Not on file     Physically abused: Not on file    Forced sexual activity: Not on file  Other Topics Concern   Not on file  Social History Narrative   Lives at home with daughter and son in [employer].     Family History  Problem Relation Age of Onset   Cervical cancer Sister    Kidney failure Mother    Stroke Mother    Alzheimer's disease Father      Current Facility-Administered Medications:    0.9 %  sodium chloride infusion, , Intravenous, Continuous, [place], Pete Pelt, MD, Last Rate: 75 mL/hr at 08/12/19 1602   0.9 %  sodium chloride infusion, , Intravenous, Continuous, Monia Sabal, PA-C   acetaminophen (TYLENOL) tablet 650 mg, 650 mg, Oral, Q6H PRN **OR** acetaminophen (TYLENOL) suppository 650 mg, 650 mg, Rectal, Q6H PRN, Harrie Foreman, MD   Chlorhexidine Gluconate Cloth 2 % PADS 6 each, 6 each, Topical, Daily, Harrie Foreman, MD, 6 each at 08/12/19 1249   dexamethasone (DECADRON) injection 4 mg, 4 mg, Intravenous, Q8H, Earlie Server, MD, 4 mg at 08/12/19 1311   docusate sodium (COLACE) capsule 100 mg, 100 mg, Oral, BID, Harrie Foreman, MD, 100 mg at 08/12/19 1247   enoxaparin (LOVENOX) injection 40 mg, 40 mg, Subcutaneous, Q24H, Harrie Foreman, MD, 40 mg at 08/11/19 2239   HYDROmorphone (DILAUDID) injection 0.5 mg, 0.5 mg, Intravenous, Q3H PRN, [facility], Pete Pelt, MD, 0.5 mg at 08/12/19 1455   metoCLOPramide (REGLAN) injection 10 mg, 10 mg, Intravenous, Q8H, Earlie Server, MD, 10 mg at 08/12/19 1316   metoprolol tartrate (LOPRESSOR) tablet 25 mg, 25 mg, Oral, BID, Mayo, Pete Pelt, MD   nicotine (NICODERM CQ - dosed in mg/24 hours) patch 21 mg, 21 mg, Transdermal, Daily, Harrie Foreman, MD, 21 mg at 08/11/19 2245   octreotide (SANDOSTATIN) injection 100 mcg, 100 mcg, Subcutaneous, Q12H, Piscoya, Jose, MD, 100 mcg at 08/12/19 1249   ondansetron (ZOFRAN) tablet 4 mg, 4 mg, Oral, Q6H PRN **OR** ondansetron (ZOFRAN) injection 4 mg, 4 mg, Intravenous, Q6H PRN, Harrie Foreman, MD   pantoprazole (PROTONIX) EC tablet 40 mg, 40 mg, Oral, Daily, [place], Pete Pelt, MD, 40 mg at 08/12/19 1309   polyethylene glycol (MIRALAX / GLYCOLAX) packet 17 g, 17 g, Oral, Daily PRN, Harrie Foreman, MD   prochlorperazine (COMPAZINE) injection 10 mg, 10 mg, Intravenous, Q6H PRN, Harrie Foreman, MD   senna-docusate (Senokot-S) tablet 2 tablet, 2 tablet, Oral, Daily, Harrie Foreman, MD, 2 tablet at 08/12/19 1247   Physical exam:  Vitals:   08/12/19 0025 08/12/19 0418 08/12/19 1243 08/12/19 1845  BP: 135/67 130/64 104/74 (!) 153/83  Pulse: 85 85 90 84  Resp:  17 18 16   Temp:  (!) 97.5 F (36.4 C)  97.6 F (36.4 C) (!) 97.3 F (36.3 C)  TempSrc:  Oral    SpO2: 95% 95% 98% 95%  Weight:       Physical Exam  Constitutional: He is oriented to person, place, and time. No distress.  Cachectic  HENT:  Head: Normocephalic and atraumatic.  Mouth/Throat: No oropharyngeal exudate.  Eyes: Pupils are equal, round, and reactive to light. EOM are normal. No scleral icterus.  Neck: Normal range of motion. Neck supple.  Cardiovascular: Normal rate and regular rhythm.  No murmur heard. Pulmonary/Chest: Effort normal. No respiratory distress. He has no rales. He exhibits no tenderness.  Abdominal: Soft. He exhibits distension. There is no abdominal tenderness.  Sluggish bowel sounds  Nephrostomy tube  Musculoskeletal: Normal range of motion.        General: No edema.  Neurological: He is alert and oriented to person, place, and time. No cranial nerve deficit. He exhibits normal muscle tone. Coordination normal.  Skin: Skin is warm and dry. He is not diaphoretic. No erythema.  Psychiatric: Affect normal.      CMP Latest Ref Rng & Units 08/12/2019  Glucose 70 - 99 mg/dL 132(H)  BUN 8 - 23 mg/dL 26(H)  Creatinine 0.61 - 1.24 mg/dL 0.81  Sodium 135 - 145 mmol/L 143  Potassium 3.5 - 5.1 mmol/L 4.1  Chloride 98 - 111 mmol/L 112(H)  CO2 22 - 32 mmol/L 16(L)  Calcium 8.9  - 10.3 mg/dL 8.5(L)  Total Protein 6.5 - 8.1 g/dL -  Total Bilirubin 0.3 - 1.2 mg/dL -  Alkaline Phos 38 - 126 U/L -  AST 15 - 41 U/L -  ALT 0 - 44 U/L -   CBC Latest Ref Rng & Units 08/12/2019  WBC 4.0 - 10.5 K/uL 11.7(H)  Hemoglobin 13.0 - 17.0 g/dL 12.1(L)  Hematocrit 39.0 - 52.0 % 37.2(L)  Platelets 150 - 400 K/uL 134(L)    RADIOGRAPHIC STUDIES: I have personally reviewed the radiological images as listed and agreed with the findings in the report. Dg Abd 1 View  Result Date: 08/10/2019 CLINICAL DATA:  68 year old male with a history of small bowel obstruction EXAM: ABDOMEN - 1 VIEW COMPARISON:  08/09/2019 FINDINGS: Similar appearance of the lower chest. Gastric tube terminates within the stomach. Unchanged appearance of the right percutaneous nephrostomy. Dilated small bowel loops within the mid abdomen. Paucity of colonic and rectal gas. Small formed stool within the rectum. No unexpected radiopaque foreign body. No displaced fracture IMPRESSION: Dilated small bowel loops indicating ongoing obstruction/ileus. Relative absence of colonic gas. Unchanged gastric tube and right percutaneous nephrostomy. Electronically Signed   By: Corrie Mckusick D.O.   On: 08/10/2019 08:09   Dg Abd 1 View  Result Date: 08/09/2019 CLINICAL DATA:  Small bowel obstruction EXAM: ABDOMEN - 1 VIEW COMPARISON:  08/07/2019 FINDINGS: NG tube tip is within the fundus of the stomach, unchanged. Right side nephrostomy catheter again noted, unchanged. There are dilated small bowel loops compatible with small bowel obstruction, slightly progressed since prior study. No organomegaly or free air. IMPRESSION: Small-bowel dilatation slightly progressed since prior study. Findings compatible with continued small bowel obstruction. Electronically Signed   By: Rolm Baptise M.D.   On: 08/09/2019 09:42   Ct Abdomen Pelvis W Contrast  Result Date: 08/07/2019 CLINICAL DATA:  Emesis for 2 days, history of bladder cancer EXAM: CT  ABDOMEN AND PELVIS WITH CONTRAST TECHNIQUE: Multidetector CT imaging of the abdomen and pelvis was performed using the standard protocol following bolus administration of intravenous contrast.  CONTRAST:  130mL OMNIPAQUE IOHEXOL 300 MG/ML  SOLN COMPARISON:  PET-CT, 07/25/2019 FINDINGS: Lower chest: No acute abnormality. Metastatic nodules in the partially included lingula (series 4, image 9). Emphysema. Hepatobiliary: No solid liver abnormality is seen. No gallstones, gallbladder wall thickening, or biliary dilatation. Pancreas: Unremarkable. No pancreatic ductal dilatation or surrounding inflammatory changes. Spleen: Normal in size without significant abnormality. Adrenals/Urinary Tract: Adrenal glands are unremarkable. Right-sided percutaneous nephrostomy with formed pigtail in the right renal pelvis. No hydronephrosis. Redemonstrated, bulky, predominantly endoluminal bladder mass and soft tissue nodules. Stomach/Bowel: There is redemonstrated, extensive, bulky, metastatic tissue involving the omentum, multiple loops of small bowel, and the anterior abdominal wall. The stomach and proximal to mid small bowel are diffusely fluid distended, with an abrupt transition point in the central abdomen abutting metastatic soft tissue (series 2, image 59) and decompression of the distal small bowel. Vascular/Lymphatic: Aortic atherosclerosis. Multiple enlarged retroperitoneal, iliac, and pelvic sidewall lymph nodes as seen on prior examination. Reproductive: No mass or other significant abnormality. Other: No abdominal wall hernia or abnormality. No abdominopelvic ascites. Musculoskeletal: No acute or significant osseous findings. IMPRESSION: 1. There is redemonstrated, extensive, bulky, metastatic tissue involving the omentum, multiple loops of small bowel, and the anterior abdominal wall, not significant changed in appearance compared to prior PET-CT examination dated 07/25/2019. The stomach and proximal to mid small bowel  are diffusely fluid distended, with an abrupt transition point in the central abdomen abutting metastatic soft tissue (series 2, image 59) and decompression of the distal small bowel. Findings are consistent with small bowel obstruction. 2. Other findings of primary bladder malignancy and advanced metastatic disease as detailed above, not significantly changed in comparison to PET-CT dated 07/25/2019. 3. Aortic Atherosclerosis (ICD10-I70.0) and Emphysema (ICD10-J43.9). Electronically Signed   By: Eddie Candle M.D.   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 EXAM: NUCLEAR MEDICINE PET SKULL BASE TO THIGH TECHNIQUE: 8.795 mCi F-18 FDG was injected intravenously. Full-ring PET imaging was performed from the skull base to thigh after the radiotracer. CT data was obtained and used for attenuation correction and anatomic localization. Fasting blood glucose: 93 mg/dl COMPARISON:  None. FINDINGS: Mediastinal blood pool activity: SUV max 1.36 . Liver activity: SUV max NA NECK: Enlarged and hypermetabolic left level 2 lymph nodes. Nodal mass measures 3.4 by 1.9 cm and has an SUV max of 7.06 Incidental CT findings: none CHEST: Hypermetabolic right paratracheal, subcarinal and bilateral hilar lymph nodes are identified. Right paratracheal node measures 1.4 cm and has an SUV max of 10.38. Index subcarinal node measures 1.1 cm and has an SUV max of 8.93. Index left hilar node measures 1.2 cm and has an SUV max of 6.41. Right hilar lymph node measures 1.2 cm and has an SUV max 8.66. Right CP angle lymph node is FDG avid measuring 1.4 cm with SUV max of 7.57. Bilateral FDG avid pulmonary nodules. Right lower lobe lung nodule measures 1 cm and has an SUV max of 1.88. Hypermetabolic subpleural nodule in the lingula measures 1 cm and has an SUV max of 6.6. Incidental CT findings: Advanced changes of  emphysema. Aortic atherosclerosis. ABDOMEN/PELVIS: There is a few small foci of FDG uptake above liver background activity without definite CT correlate, equivocal for metastasis. For example, within segment 8 there is an area of increased uptake within SUV max of 2.67. This is compared with background liver activity  of 2.15. No abnormal uptake within the pancreas, spleen or adrenal glands. Soft tissue mass involving the bladder measures 9 cm within SUV max of 15.7. Hypermetabolic right retrocrural lymph node measures 1 cm and has an SUV max of 7.13. Multiple hypermetabolic retroperitoneal and bilateral iliac and inguinal lymph nodes identified. Index left periaortic node measures 1.1 cm within SUV max of 8.01. Index retrocaval lymph node measures 1.3 cm and has an SUV max of 6.25. Left common iliac node measures 1.3 cm and has an SUV max of 1.2. Right common iliac node measures 1.5 cm and has an SUV max of 6.4. Left external iliac node measures 1.8 cm with SUV max of 8.79. Left inguinal node measures 0.7 cm and has an SUV max of 4.87. Left pericolic gutter soft tissue mass measures 12.4 cm and has SUV max of 11.9. Right lateral abdominal mass measures 8 cm and has SUV max of 12.07. Large mass within the ventral abdomen measures 16.6 cm within SUV max of 11.36. Small FDG avid peritoneal implants are identified along the surface of the liver. Incidental CT findings: Right-sided percutaneous nephrostomy tube is in place. No hydronephrosis identified bilaterally. Aortic atherosclerosis. SKELETON: No focal hypermetabolic activity to suggest skeletal metastasis. Incidental CT findings: none IMPRESSION: 1. Large mass within the urinary bladder is identified and is hypermetabolic concerning for primary urothelial neoplasm. 2. Hypermetabolic left cervical, thoracic, abdominal, and pelvic lymph nodes consistent with metastatic adenopathy. 3. Extensive, bulky, hypermetabolic peritoneal disease within the abdomen and pelvis.  4. Bilateral pulmonary nodules concerning for metastatic disease. 5. A few small foci (without CT correlate) of mild FDG uptake are noted in the liver which are equivocal for metastasis. 6. No findings to suggest osseous metastasis. 7. Aortic Atherosclerosis (ICD10-I70.0) and Emphysema (ICD10-J43.9). Electronically Signed   By: Kerby Moors M.D.   On: 07/25/2019 13:30   Dg Chest Portable 1 View  Result Date: 08/07/2019 CLINICAL DATA:  Elevated white count, emesis for 3 days. EXAM: PORTABLE CHEST 1 VIEW COMPARISON:  Chest radiograph dated 07/21/2019 FINDINGS: The heart size and mediastinal contours are within normal limits. Emphysematous changes are noted. Both lungs are clear. The visualized skeletal structures are unremarkable. A left subclavian approach central venous port catheter tip overlies the superior vena cava. A pigtail catheter overlies the right upper quadrant. IMPRESSION: No acute cardiopulmonary findings. Emphysema (ICD10-J43.9). Electronically Signed   By: Zerita Boers M.D.   On: 08/07/2019 15:17   Dg Chest Port 1 View  Result Date: 07/21/2019 CLINICAL DATA:  Port placement.  Metastatic bladder cancer. EXAM: PORTABLE CHEST 1 VIEW COMPARISON:  Chest x-ray dated Feb 24, 2014. FINDINGS: New left chest wall port catheter with tip in the mid SVC. The heart size and mediastinal contours are within normal limits. Chronically coarsened interstitial markings with emphysematous changes. Small area of nodularity at the left lung base. No focal consolidation, pleural effusion, or pneumothorax. No acute osseous abnormality. IMPRESSION: 1. New appropriately positioned left chest wall port catheter without complicating feature. 2. Small area of nodularity at the left lung base. Metastatic disease is not excluded. Electronically Signed   By: Titus Dubin M.D.   On: 07/21/2019 19:39   Dg Abd Portable 1 View  Result Date: 08/07/2019 CLINICAL DATA:  NG tube placement EXAM: PORTABLE ABDOMEN - 1 VIEW  COMPARISON:  CT abdomen/pelvis 08/07/2019 FINDINGS: Nasogastric tube with the tip projecting over the stomach. There is ill-defined dilatation of the small bowel consistent with bowel obstruction. There is no evidence of pneumoperitoneum, portal  venous gas or pneumatosis. There are no pathologic calcifications along the expected course of the ureters. Right percutaneous nephrostomy tube. The osseous structures are unremarkable. IMPRESSION: 1. Nasogastric tube with the tip projecting over the stomach. Electronically Signed   By: Kathreen Devoid   On: 08/07/2019 17:39   Dg C-arm 1-60 Min-no Report  Result Date: 07/21/2019 Fluoroscopy was utilized by the requesting physician.  No radiographic interpretation.    Assessment and plan-  68 year old male metastatic bladder cancer, not yet started on chemotherapy, presented for evaluation of nausea vomiting.  Imaging work-up showed small bowel obstruction.  #Small bowel obstruction in the setting of widely metastatic bladder cancer with peritoneal carcinomatosis, no intervention per surgery given peritoneal carcinomatosis. Continue supportive care. Clinically improving.  Tolerating clear liquids. Continue octreotide 0.1mg  subQ Q12h, Reglan 10mg  Q8 hours, taper Dexamethasone to 4mg  BID.  Slow advance diet as tolerated.   Leukocytosis likely due to steroid use.  He needs outpatient follow up 1-2 days after discharge. Plan switch to long acting Santostatin LAR outpatient CODE STATUS, DNR/DNI.   Earlie Server, MD, PhD Hematology Oncology [facility] Pager- [phone] 08/12/2019

## 2019-08-12 NOTE — Care Management Important Message (Signed)
Important Message  Patient Details  Name: Johnny Martin MRN: [id number] Date of Birth: 09/10/1951   Medicare Important Message Given:  Yes     Juliann Pulse A Rocket Gunderson 08/12/2019, 11:47 AM

## 2019-08-12 NOTE — Progress Notes (Signed)
Johnny Martin Hospital Day(s): 5.   Interval History: Patient seen and examined, no acute events or new complaints overnight. NGT removed yesterday after passed clamping trial. Started on CLD. Still with multiple BMx and Flatus. No abdominal pain, mild distension. No fever, chills, nausea, or emesis. No other issues.   Vital signs in last 24 hours: [min-max] current  Temp:  [97.5 F (36.4 C)-98.6 F (37 C)] 97.5 F (36.4 C) (10/30 0418) Pulse Rate:  [85-99] 85 (10/30 0418) Resp:  [16-17] 17 (10/30 0418) BP: (128-138)/(64-74) 130/64 (10/30 0418) SpO2:  [95 %-99 %] 95 % (10/30 0418)       Weight: 69 kg BMI (Calculated): 20.07   Intake/Output last 2 shifts:  10/29 0701 - 10/30 0700 In: 3588.4 [I.V.:3588.4] Out: 925 [Urine:925]   Physical Exam:  Constitutional: alert, cooperative and no distress  HENT: normocephalic without obvious abnormality Eyes: PERRL, EOM's grossly intact and symmetric  Respiratory: breathing non-labored at rest Chest:Port in left upper chest, incision healing well with dermabond Cardiovascular: regular rate and sinus rhythm  Gastrointestinal: soft, non-tender,still with mild distension. There is a palpable firm mass in the supraumbilical region. No rebound/guarding Genitourinary: Nephrostomy tube in place, making good urine  Labs:  CBC Latest Ref Rng & Units 08/12/2019 08/11/2019 08/11/2019  WBC 4.0 - 10.5 K/uL 11.7(H) 7.7 7.2  Hemoglobin 13.0 - 17.0 g/dL 12.1(L) 13.1 12.1(L)  Hematocrit 39.0 - 52.0 % 37.2(L) 40.8 37.4(L)  Platelets 150 - 400 K/uL 134(L) 144(L) 121(L)   CMP Latest Ref Rng & Units 08/12/2019 08/11/2019 08/10/2019  Glucose 70 - 99 mg/dL 132(H) 102(H) 80  BUN 8 - 23 mg/dL 26(H) 26(H) 27(H)  Creatinine 0.61 - 1.24 mg/dL 0.81 0.90 0.91  Sodium 135 - 145 mmol/L 143 145 145  Potassium 3.5 - 5.1 mmol/L 4.1 4.1 4.3  Chloride 98 - 111 mmol/L 112(H) 114(H) 115(H)  CO2 22 - 32 mmol/L 16(L) 15(L) 16(L)   Calcium 8.9 - 10.3 mg/dL 8.5(L) 8.6(L) 9.0  Total Protein 6.5 - 8.1 g/dL - - -  Total Bilirubin 0.3 - 1.2 mg/dL - - -  Alkaline Phos 38 - 126 U/L - - -  AST 15 - 41 U/L - - -  ALT 0 - 44 U/L - - -    Imaging studies: No new pertinent imaging studies   Assessment/Plan:  68 y.o. male with clinically improving small bowel obstruction related to peritoneal carcinomatosis, although he continues to have high output NGT and dilated loops on KUB, complicated by pertinent comorbidities includingmetastatic bladder CA.   - Continue CLD for today; likely advance starting tomorrow   - Serial abdominal examination +/- KUBs - Appreciate oncology and palliative consults - Again, this is a very difficult situation and surgical intervention has low yield given likelihood of peritoneal carcinomatosis and possible frozen abdomen. Will continue conservative measures described above - pain control prn - mobilization encouraged - Further management per primary service  All of the above findings and recommendations were discussed with the patient, and the medical team, and all of patient's questions were answered to his expressed satisfaction.  -- Johnny Simon, PA-C [facility] 08/12/2019, 7:18 AM [phone] M-F: 7am - 4pm

## 2019-08-12 NOTE — Progress Notes (Signed)
Johnny Martin NAME: Johnny Martin    MR#:  [id number]  DATE OF BIRTH:  January 30, 1951  SUBJECTIVE:   Patient states he is feeling well today.  He passed his clamping trial yesterday and his NG tube was removed yesterday afternoon.  He tolerated a clear liquid diet for dinner last night and for breakfast this morning.  He denies any abdominal pain, nausea, or vomiting.  He had 3 bowel movements yesterday and another bowel movement this morning.  He continues to pass flatus.  REVIEW OF SYSTEMS:  Review of Systems  Constitutional: Negative for chills and fever.  HENT: Negative.  Negative for congestion and sore throat.   Eyes: Negative for blurred vision and double vision.  Respiratory: Negative for cough and shortness of breath.   Cardiovascular: Negative for chest pain and palpitations.  Gastrointestinal: Negative for abdominal pain, diarrhea, nausea and vomiting.  Genitourinary: Negative for dysuria and urgency.  Musculoskeletal: Negative for back pain and neck pain.  Neurological: Negative for dizziness and headaches.  Psychiatric/Behavioral: Negative for depression. The patient is not nervous/anxious.    DRUG ALLERGIES:  No Known Allergies VITALS:  Blood pressure 130/64, pulse 85, temperature (!) 97.5 F (36.4 C), temperature source Oral, resp. rate 17, weight 69 kg, SpO2 95 %. PHYSICAL EXAMINATION:  Physical Exam  GENERAL:  Laying in the bed with no acute distress. Chronically-ill appearing. HEENT: Head atraumatic, normocephalic. Pupils equal, round, reactive to light and accommodation. No scleral icterus. Extraocular muscles intact. NG tube in place. NECK:  Supple, no jugular venous distention. No thyroid enlargement. LUNGS: Lungs are clear to auscultation bilaterally. No wheezes, crackles, rhonchi. No use of accessory muscles of respiration.  CARDIOVASCULAR: RRR, S1, S2 normal. No murmurs, rubs, or gallops.  ABDOMEN: Soft.  +mild  distention, no tenderness to palpation.  No rebound or guarding. +BS. EXTREMITIES: No pedal edema, cyanosis, or clubbing.  NEUROLOGIC: CN 2-12 intact, no focal deficits. +global weakness. Sensation intact throughout. Gait not checked.  PSYCHIATRIC: The patient is alert and oriented x 3.  SKIN: No obvious rash, lesion, or ulcer.  LABORATORY PANEL:  Male CBC Recent Labs  Lab 08/12/19 0611  WBC 11.7*  HGB 12.1*  HCT 37.2*  PLT 134*   ------------------------------------------------------------------------------------------------------------------ Chemistries  Recent Labs  Lab 08/07/19 1115  08/12/19 0611  NA 133*   < > 143  K 4.8   < > 4.1  CL 90*   < > 112*  CO2 23   < > 16*  GLUCOSE 142*   < > 132*  BUN 45*   < > 26*  CREATININE 1.45*   < > 0.81  CALCIUM 9.1   < > 8.5*  AST 32  --   --   ALT 18  --   --   ALKPHOS 143*  --   --   BILITOT 1.4*  --   --    < > = values in this interval not displayed.   RADIOLOGY:  No results found. ASSESSMENT AND PLAN:   Small bowel obstruction due to metastatic bladder cancer and peritoneal carcinomatosis- improving.  NG tube removed yesterday afternoon. -Surgery following -Continue octreotide, reglan, and dexamethasone -Continue clear liquid diet throughout the day today and will plan to advance diet very slowly -Cut back on IV fluid rate today -Pain control and IV anti-emetics -Palliative care following -Up with PT  Metastatic bladder cancer with peritoneal carcinomatosis with nephrostomy tube in place -Oncology following -Palliative care  consult  Tobacco use -Continue nicotine patch  Daughter, Johnny Martin, updated via the phone.  All the records are reviewed and case discussed with Care Management/Social Worker. Management plans discussed with the patient, family and they are in agreement.  CODE STATUS: DNR  TOTAL TIME TAKING CARE OF THIS PATIENT: 38 minutes.   More than 50% of the time was spent in counseling/coordination of  care: YES  POSSIBLE D/C IN 2-3 DAYS, DEPENDING ON CLINICAL CONDITION.   Johnny Martin M.D on 08/12/2019 at 10:31 AM  Between 7am to 6pm - Pager - [phone]  After 6pm go to [email or web address] - Technical brewer [facility]  Office  [phone]  CC: Primary care physician; Johnny Server, MD  Note: This dictation was prepared with Dragon dictation along with smaller phrase technology. Any transcriptional errors that result from this process are unintentional.

## 2019-08-12 NOTE — Evaluation (Signed)
Physical Therapy Evaluation Patient Details Name: Johnny Martin MRN: [id number] DOB: January 16, 1951 Today's Date: 08/12/2019   History of Present Illness  Pt is a 68 y/o male admitted for SBO on 10/25. Pt reported emesis for 3 days prior to admission. PMH includes metastatic bladder CA, which is expected to be the cause of the SBO.    Clinical Impression  Pt is a pleasant 68 year old M who was admitted for SBO. Upon entry, pt is resting in bed, states he has no pain. Pt performs bed mobility, transfers, and ambulation with supervision/CGA and use of IV pole for support with amb. Pt demonstrates deficits with strength, activity tolerance, mobility. Pt will benefit from skilled PT to address above deficits; current follow-up care recommendation is OPPT.      Follow Up Recommendations Outpatient PT    Equipment Recommendations  Cane    Recommendations for Other Services       Precautions / Restrictions Precautions Precautions: Fall Restrictions Weight Bearing Restrictions: No      Mobility  Bed Mobility Overal bed mobility: Needs Assistance Bed Mobility: Supine to Sit     Supine to sit: Supervision     General bed mobility comments: Pt is able to reach EOB with use of bedrails and supervision. Sits upright  Transfers Overall transfer level: Needs assistance Equipment used: None Transfers: Sit to/from Stand Sit to Stand: Min guard         General transfer comment: Pt is able to perform transfer from sit> stand, bud demonstrates significant forward lean upon standing. Would benefit from UE support.  Ambulation/Gait Ambulation/Gait assistance: Min guard Gait Distance (Feet): 100 Feet Assistive device: IV Pole Gait Pattern/deviations: Step-through pattern;Wide base of support;Trunk flexed     General Gait Details: Pt performs amb with forward flexed posture and wide BOS. Pt reaches for IV pole with LUE almost immediately when amb. Following bout of amb, pt appears tired  and reaches for support from other hand. Amb distance is limited by fatigue  Stairs            Wheelchair Mobility    Modified Rankin (Stroke Patients Only)       Balance Overall balance assessment: Needs assistance Sitting-balance support: Feet supported Sitting balance-Leahy Scale: Good     Standing balance support: No upper extremity supported Standing balance-Leahy Scale: Fair Standing balance comment: Without support from IV pole/AD, pt is able to maintain stand but is leaning slightly forward and appears unsteady. Stability improves when amb with IV pole                             Pertinent Vitals/Pain Pain Assessment: No/denies pain    Home Living Family/patient expects to be discharged to:: Other (Comment) Living Arrangements: Children               Additional Comments: Pt reports that he has been living in a hotel with his daughter and son-in-law for ~ 1 month. Previously was living in trailer but reports they will not be returning there.    Prior Function Level of Independence: Independent         Comments: Pt reports to be moving independently without use of AD's; walks household distances. He does report that he tends to reach for objects and agrees that he would benefit from UE support i.e. cane. Independent in ADL's but has help from daughter with cleaning, meals, etc.     Hand Dominance  Extremity/Trunk Assessment   Upper Extremity Assessment Upper Extremity Assessment: Generalized weakness    Lower Extremity Assessment Lower Extremity Assessment: Generalized weakness    Cervical / Trunk Assessment Cervical / Trunk Assessment: Kyphotic  Communication   Communication: No difficulties  Cognition Arousal/Alertness: Awake/alert Behavior During Therapy: WFL for tasks assessed/performed Overall Cognitive Status: Within Functional Limits for tasks assessed                                 General  Comments: A, O x 3      General Comments      Exercises Other Exercises Other Exercises: Supine, 10 reps, bilateral: AP, QS, SLR; PT supervision Other Exercises: Seated EOB, 10 reps, bilateral: LAQ, march; PT supervision   Assessment/Plan    PT Assessment Patient needs continued PT services  PT Problem List Decreased strength;Decreased activity tolerance;Decreased balance;Decreased mobility;Decreased coordination       PT Treatment Interventions DME instruction;Stair training;Gait training;Functional mobility training;Therapeutic activities;Therapeutic exercise;Balance training;Patient/family education    PT Goals (Current goals can be found in the Care Plan section)  Acute Rehab PT Goals Patient Stated Goal: to get stronger PT Goal Formulation: With patient Time For Goal Achievement: 08/26/19 Potential to Achieve Goals: Good    Frequency Min 2X/week   Barriers to discharge        Co-evaluation               AM-PAC PT "6 Clicks" Mobility  Outcome Measure Help needed turning from your back to your side while in a flat bed without using bedrails?: None Help needed moving from lying on your back to sitting on the side of a flat bed without using bedrails?: None Help needed moving to and from a bed to a chair (including a wheelchair)?: A Little Help needed standing up from a chair using your arms (e.g., wheelchair or bedside chair)?: A Little Help needed to walk in hospital room?: A Little Help needed climbing 3-5 steps with a railing? : A Little 6 Click Score: 20    End of Session Equipment Utilized During Treatment: Gait belt Activity Tolerance: Patient tolerated treatment well Patient left: in chair;with call bell/phone within reach;with chair alarm set;with SCD's reapplied Nurse Communication: Patient requests pain meds;Mobility status PT Visit Diagnosis: Unsteadiness on feet (R26.81);Muscle weakness (generalized) (M62.81)    Time: [id number] PT Time  Calculation (min) (ACUTE ONLY): 29 min   Charges:             Dixie Dials, SPT   Danyia Borunda 08/12/2019, 3:14 PM

## 2019-08-12 NOTE — Progress Notes (Signed)
[facility]  Telephone:([phone] [phone]   Name: Johnny Martin Date: 08/12/2019 MRN: [id number]  DOB: 1951/04/23  Patient Care Team: Earlie Server, MD as PCP - General (Oncology)    REASON FOR CONSULTATION: Palliative Care consult requested for this68 y.o.malewith multiple medical problems including history of bladder cancer lost to follow-up, who was recently hospitalized 07/11/2019-07/14/2019 with hematuria.Work-up revealed right-sided hydronephrosis requiring a right nephrostomy tube. 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 Subsequent PET scan on 07/25/2019 shows metastatic disease to bladder, with widespread lymph involvement, bilateral pulmonary nodules, liver, and extensive hypermetabolic peritoneal disease. He also was noted to have a left neck mass concerning for malignancy.  Patient was admitted to the hospital on 08/07/2019 with intractable nausea and vomiting.  He was found to have a small bowel obstruction per abdominal CT.  Patient has not felt to be a surgical candidate and is being treated conservatively with gastric decompression via NGT.Patient was referred to palliative care to help address goals and manage ongoing symptoms.   CODE STATUS: DNR  PAST MEDICAL HISTORY: Past Medical History:  Diagnosis Date   Bladder cancer ([place]) 07/21/2019   Cancer ([place])    Bladder cancer    PAST SURGICAL HISTORY:  Past Surgical History:  Procedure Laterality Date   BLADDER SURGERY     BRAIN SURGERY     plate in head per patient since childhood    PORTACATH PLACEMENT Left 07/21/2019   Procedure: INSERTION PORT-A-CATH;  Surgeon: Olean Ree, MD;  Location: [facility] ORS;  Service: General;  Laterality: Left;    HEMATOLOGY/ONCOLOGY HISTORY:  Oncology History  Bladder cancer ([place])  07/21/2019 Initial Diagnosis   Bladder cancer ([place])   07/27/2019 - 07/27/2019  Chemotherapy   The patient had PALONOSETRON HCL INJECTION 0.25 MG/5ML, 0.25 mg, Intravenous,  Once, 0 of 4 cycles CARBOplatin (PARAPLATIN) in sodium chloride 0.9 % 100 mL chemo infusion, , Intravenous,  Once, 0 of 4 cycles gemcitabine (GEMZAR) 1,900 mg in sodium chloride 0.9 % 250 mL chemo infusion, 1,000 mg/m2, Intravenous,  Once, 0 of 4 cycles FOSAPREPITANT 150MG  + DEXAMETHASONE INFUSION [facility], , Intravenous,  Once, 0 of 4 cycles  for chemotherapy treatment.    08/03/2019 - 08/03/2019 Chemotherapy   The patient had palonosetron (ALOXI) injection 0.25 mg, 0.25 mg, Intravenous,  Once, 0 of 6 cycles CISplatin (PLATINOL) 133 mg in sodium chloride 0.9 % 500 mL chemo infusion, 70 mg/m2, Intravenous,  Once, 0 of 6 cycles gemcitabine (GEMZAR) 1,900 mg in sodium chloride 0.9 % 250 mL chemo infusion, 1,000 mg/m2, Intravenous,  Once, 0 of 6 cycles fosaprepitant (EMEND) 150 mg, dexamethasone (DECADRON) 12 mg in sodium chloride 0.9 % 145 mL IVPB, , Intravenous,  Once, 0 of 6 cycles  for chemotherapy treatment.    08/10/2019 -  Chemotherapy   The patient had PALONOSETRON HCL INJECTION 0.25 MG/5ML, 0.25 mg, Intravenous,  Once, 0 of 4 cycles CARBOplatin (PARAPLATIN) in sodium chloride 0.9 % 100 mL chemo infusion, , Intravenous,  Once, 0 of 4 cycles gemcitabine (GEMZAR) 1,900 mg in sodium chloride 0.9 % 250 mL chemo infusion, 1,000 mg/m2, Intravenous,  Once, 0 of 4 cycles FOSAPREPITANT 150MG  + DEXAMETHASONE INFUSION [facility], , Intravenous,  Once, 0 of 4 cycles  for chemotherapy treatment.      ALLERGIES:  has No Known Allergies.  MEDICATIONS:  Current Facility-Administered Medications  Medication Dose Route Frequency Provider Last Rate Last Dose   0.9 %  sodium chloride infusion   Intravenous Continuous Mayo, Pete Pelt, MD 75 mL/hr at 08/12/19 1250     0.9 %  sodium chloride infusion   Intravenous Continuous Monia Sabal, PA-C       acetaminophen (TYLENOL) tablet 650 mg  650 mg Oral Q6H PRN Harrie Foreman, MD       Or   acetaminophen (TYLENOL) suppository 650 mg  650 mg Rectal Q6H PRN Harrie Foreman, MD       Chlorhexidine Gluconate Cloth 2 % PADS 6 each  6 each Topical Daily Harrie Foreman, MD   6 each at 08/12/19 1249   dexamethasone (DECADRON) injection 4 mg  4 mg Intravenous Girtha Rm, MD   4 mg at 08/12/19 1311   docusate sodium (COLACE) capsule 100 mg  100 mg Oral BID Harrie Foreman, MD   100 mg at 08/12/19 1247   enoxaparin (LOVENOX) injection 40 mg  40 mg Subcutaneous Q24H Harrie Foreman, MD   40 mg at 08/11/19 2239   HYDROmorphone (DILAUDID) injection 0.5 mg  0.5 mg Intravenous Q3H PRN Mayo, Pete Pelt, MD   0.5 mg at 08/12/19 1455   metoCLOPramide (REGLAN) injection 10 mg  10 mg Intravenous Girtha Rm, MD   10 mg at 08/12/19 1316   metoprolol tartrate (LOPRESSOR) tablet 25 mg  25 mg Oral BID Mayo, Pete Pelt, MD       nicotine (NICODERM CQ - dosed in mg/24 hours) patch 21 mg  21 mg Transdermal Daily Harrie Foreman, MD   21 mg at 08/11/19 2245   octreotide (SANDOSTATIN) injection 100 mcg  100 mcg Subcutaneous Q12H Piscoya, Jose, MD   100 mcg at 08/12/19 1249   ondansetron (ZOFRAN) tablet 4 mg  4 mg Oral Q6H PRN Harrie Foreman, MD       Or   ondansetron [facility]) injection 4 mg  4 mg Intravenous Q6H PRN Harrie Foreman, MD       pantoprazole (PROTONIX) EC tablet 40 mg  40 mg Oral Daily Mayo, Pete Pelt, MD   40 mg at 08/12/19 1309   polyethylene glycol (MIRALAX / GLYCOLAX) packet 17 g  17 g Oral Daily PRN Harrie Foreman, MD       prochlorperazine (COMPAZINE) injection 10 mg  10 mg Intravenous Q6H PRN Harrie Foreman, MD       senna-docusate (Senokot-S) tablet 2 tablet  2 tablet Oral Daily Harrie Foreman, MD   2 tablet at 08/12/19 1247    VITAL SIGNS: BP 104/74 (BP Location: Right Arm)    Pulse 90    Temp 97.6 F (36.4 C)    Resp 18    Wt 152 lb 1.9 oz (69 kg)    SpO2 98%    BMI 20.07 kg/m  Filed Weights   08/08/19 0500  08/08/19 0654 08/09/19 0500  Weight: 154 lb 5.2 oz (70 kg) 154 lb 5.2 oz (70 kg) 152 lb 1.9 oz (69 kg)    Estimated body mass index is 20.07 kg/m as calculated from the following:   Height as of 08/02/19: 6\' 1"  (1.854 m).   Weight as of this encounter: 152 lb 1.9 oz (69 kg).  LABS: CBC:    Component Value Date/Time   WBC 11.7 (H) 08/12/2019 0611   HGB 12.1 (L) 08/12/2019 0611   HGB 8.7 (L) 03/02/2014 0948   HCT 37.2 (L) 08/12/2019 0611   HCT 26.2 (L) 03/02/2014 0948   PLT  134 (L) 08/12/2019 0611   PLT 136 (L) 03/02/2014 0948   MCV 90.5 08/12/2019 0611   MCV 88 03/02/2014 0948   NEUTROABS 5.4 07/26/2019 1011   NEUTROABS 12.4 (H) 03/02/2014 0948   LYMPHSABS 1.1 07/26/2019 1011   LYMPHSABS 1.4 03/02/2014 0948   MONOABS 0.7 07/26/2019 1011   MONOABS 0.5 03/02/2014 0948   EOSABS 0.1 07/26/2019 1011   EOSABS 0.3 03/02/2014 0948   BASOSABS 0.0 07/26/2019 1011   BASOSABS 0.0 03/02/2014 0948   Comprehensive Metabolic Panel:    Component Value Date/Time   NA 143 08/12/2019 0611   NA 138 02/26/2014 0307   K 4.1 08/12/2019 0611   K 3.8 02/26/2014 0307   CL 112 (H) 08/12/2019 0611   CL 107 02/26/2014 0307   CO2 16 (L) 08/12/2019 0611   CO2 27 02/26/2014 0307   BUN 26 (H) 08/12/2019 0611   BUN 17 02/26/2014 0307   CREATININE 0.81 08/12/2019 0611   CREATININE 1.08 02/26/2014 0307   GLUCOSE 132 (H) 08/12/2019 0611   GLUCOSE 89 02/26/2014 0307   CALCIUM 8.5 (L) 08/12/2019 0611   CALCIUM 7.4 (L) 02/26/2014 0307   AST 32 08/07/2019 1115   AST 20 02/23/2014 2141   ALT 18 08/07/2019 1115   ALT 18 02/23/2014 2141   ALKPHOS 143 (H) 08/07/2019 1115   ALKPHOS 49 02/23/2014 2141   BILITOT 1.4 (H) 08/07/2019 1115   BILITOT 0.3 02/23/2014 2141   PROT 7.4 08/07/2019 1115   PROT 5.1 (L) 02/23/2014 2141   ALBUMIN 3.7 08/07/2019 1115   ALBUMIN 2.6 (L) 02/23/2014 2141    RADIOGRAPHIC STUDIES: Dg Abd 1 View  Result Date: 08/10/2019 CLINICAL DATA:  68 year old male with a history of  small bowel obstruction EXAM: ABDOMEN - 1 VIEW COMPARISON:  08/09/2019 FINDINGS: Similar appearance of the lower chest. Gastric tube terminates within the stomach. Unchanged appearance of the right percutaneous nephrostomy. Dilated small bowel loops within the mid abdomen. Paucity of colonic and rectal gas. Small formed stool within the rectum. No unexpected radiopaque foreign body. No displaced fracture IMPRESSION: Dilated small bowel loops indicating ongoing obstruction/ileus. Relative absence of colonic gas. Unchanged gastric tube and right percutaneous nephrostomy. Electronically Signed   By: Corrie Mckusick D.O.   On: 08/10/2019 08:09   Dg Abd 1 View  Result Date: 08/09/2019 CLINICAL DATA:  Small bowel obstruction EXAM: ABDOMEN - 1 VIEW COMPARISON:  08/07/2019 FINDINGS: NG tube tip is within the fundus of the stomach, unchanged. Right side nephrostomy catheter again noted, unchanged. There are dilated small bowel loops compatible with small bowel obstruction, slightly progressed since prior study. No organomegaly or free air. IMPRESSION: Small-bowel dilatation slightly progressed since prior study. Findings compatible with continued small bowel obstruction. Electronically Signed   By: Rolm Baptise M.D.   On: 08/09/2019 09:42   Ct Abdomen Pelvis W Contrast  Result Date: 08/07/2019 CLINICAL DATA:  Emesis for 2 days, history of bladder cancer EXAM: CT ABDOMEN AND PELVIS WITH CONTRAST TECHNIQUE: Multidetector CT imaging of the abdomen and pelvis was performed using the standard protocol following bolus administration of intravenous contrast. CONTRAST:  177mL OMNIPAQUE IOHEXOL 300 MG/ML  SOLN COMPARISON:  PET-CT, 07/25/2019 FINDINGS: Lower chest: No acute abnormality. Metastatic nodules in the partially included lingula (series 4, image 9). Emphysema. Hepatobiliary: No solid liver abnormality is seen. No gallstones, gallbladder wall thickening, or biliary dilatation. Pancreas: Unremarkable. No pancreatic  ductal dilatation or surrounding inflammatory changes. Spleen: Normal in size without significant abnormality. Adrenals/Urinary Tract: Adrenal glands  are unremarkable. Right-sided percutaneous nephrostomy with formed pigtail in the right renal pelvis. No hydronephrosis. Redemonstrated, bulky, predominantly endoluminal bladder mass and soft tissue nodules. Stomach/Bowel: There is redemonstrated, extensive, bulky, metastatic tissue involving the omentum, multiple loops of small bowel, and the anterior abdominal wall. The stomach and proximal to mid small bowel are diffusely fluid distended, with an abrupt transition point in the central abdomen abutting metastatic soft tissue (series 2, image 59) and decompression of the distal small bowel. Vascular/Lymphatic: Aortic atherosclerosis. Multiple enlarged retroperitoneal, iliac, and pelvic sidewall lymph nodes as seen on prior examination. Reproductive: No mass or other significant abnormality. Other: No abdominal wall hernia or abnormality. No abdominopelvic ascites. Musculoskeletal: No acute or significant osseous findings. IMPRESSION: 1. There is redemonstrated, extensive, bulky, metastatic tissue involving the omentum, multiple loops of small bowel, and the anterior abdominal wall, not significant changed in appearance compared to prior PET-CT examination dated 07/25/2019. The stomach and proximal to mid small bowel are diffusely fluid distended, with an abrupt transition point in the central abdomen abutting metastatic soft tissue (series 2, image 59) and decompression of the distal small bowel. Findings are consistent with small bowel obstruction. 2. Other findings of primary bladder malignancy and advanced metastatic disease as detailed above, not significantly changed in comparison to PET-CT dated 07/25/2019. 3. Aortic Atherosclerosis (ICD10-I70.0) and Emphysema (ICD10-J43.9). Electronically Signed   By: Eddie Candle M.D.   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 EXAM: NUCLEAR MEDICINE PET SKULL BASE TO THIGH TECHNIQUE: 8.795 mCi F-18 FDG was injected intravenously. Full-ring PET imaging was performed from the skull base to thigh after the radiotracer. CT data was obtained and used for attenuation correction and anatomic localization. Fasting blood glucose: 93 mg/dl COMPARISON:  None. FINDINGS: Mediastinal blood pool activity: SUV max 1.36 . Liver activity: SUV max NA NECK: Enlarged and hypermetabolic left level 2 lymph nodes. Nodal mass measures 3.4 by 1.9 cm and has an SUV max of 7.06 Incidental CT findings: none CHEST: Hypermetabolic right paratracheal, subcarinal and bilateral hilar lymph nodes are identified. Right paratracheal node measures 1.4 cm and has an SUV max of 10.38. Index subcarinal node measures 1.1 cm and has an SUV max of 8.93. Index left hilar node measures 1.2 cm and has an SUV max of 6.41. Right hilar lymph node measures 1.2 cm and has an SUV max 8.66. Right CP angle lymph node is FDG avid measuring 1.4 cm with SUV max of 7.57. Bilateral FDG avid pulmonary nodules. Right lower lobe lung nodule measures 1 cm and has an SUV max of 1.88. Hypermetabolic subpleural nodule in the lingula measures 1 cm and has an SUV max of 6.6. Incidental CT findings: Advanced changes of emphysema. Aortic atherosclerosis. ABDOMEN/PELVIS: There is a few small foci of FDG uptake above liver background activity without definite CT correlate, equivocal for metastasis. For example, within segment 8 there is an area of increased uptake within SUV max of 2.67. This is compared with background liver activity of 2.15. No abnormal uptake within the pancreas, spleen or adrenal glands. Soft tissue mass involving the bladder measures 9 cm within SUV max of 15.7. Hypermetabolic right retrocrural lymph node measures 1 cm and has an SUV max of 7.13.  Multiple hypermetabolic retroperitoneal and bilateral iliac and inguinal lymph nodes identified. Index left periaortic node measures 1.1 cm within SUV max of 8.01. Index retrocaval lymph  node measures 1.3 cm and has an SUV max of 6.25. Left common iliac node measures 1.3 cm and has an SUV max of 1.2. Right common iliac node measures 1.5 cm and has an SUV max of 6.4. Left external iliac node measures 1.8 cm with SUV max of 8.79. Left inguinal node measures 0.7 cm and has an SUV max of 4.87. Left pericolic gutter soft tissue mass measures 12.4 cm and has SUV max of 11.9. Right lateral abdominal mass measures 8 cm and has SUV max of 12.07. Large mass within the ventral abdomen measures 16.6 cm within SUV max of 11.36. Small FDG avid peritoneal implants are identified along the surface of the liver. Incidental CT findings: Right-sided percutaneous nephrostomy tube is in place. No hydronephrosis identified bilaterally. Aortic atherosclerosis. SKELETON: No focal hypermetabolic activity to suggest skeletal metastasis. Incidental CT findings: none IMPRESSION: 1. Large mass within the urinary bladder is identified and is hypermetabolic concerning for primary urothelial neoplasm. 2. Hypermetabolic left cervical, thoracic, abdominal, and pelvic lymph nodes consistent with metastatic adenopathy. 3. Extensive, bulky, hypermetabolic peritoneal disease within the abdomen and pelvis. 4. Bilateral pulmonary nodules concerning for metastatic disease. 5. A few small foci (without CT correlate) of mild FDG uptake are noted in the liver which are equivocal for metastasis. 6. No findings to suggest osseous metastasis. 7. Aortic Atherosclerosis (ICD10-I70.0) and Emphysema (ICD10-J43.9). Electronically Signed   By: Kerby Moors M.D.   On: 07/25/2019 13:30   Dg Chest Portable 1 View  Result Date: 08/07/2019 CLINICAL DATA:  Elevated white count, emesis for 3 days. EXAM: PORTABLE CHEST 1 VIEW COMPARISON:  Chest radiograph dated  07/21/2019 FINDINGS: The heart size and mediastinal contours are within normal limits. Emphysematous changes are noted. Both lungs are clear. The visualized skeletal structures are unremarkable. A left subclavian approach central venous port catheter tip overlies the superior vena cava. A pigtail catheter overlies the right upper quadrant. IMPRESSION: No acute cardiopulmonary findings. Emphysema (ICD10-J43.9). Electronically Signed   By: Zerita Boers M.D.   On: 08/07/2019 15:17   Dg Chest Port 1 View  Result Date: 07/21/2019 CLINICAL DATA:  Port placement.  Metastatic bladder cancer. EXAM: PORTABLE CHEST 1 VIEW COMPARISON:  Chest x-ray dated Feb 24, 2014. FINDINGS: New left chest wall port catheter with tip in the mid SVC. The heart size and mediastinal contours are within normal limits. Chronically coarsened interstitial markings with emphysematous changes. Small area of nodularity at the left lung base. No focal consolidation, pleural effusion, or pneumothorax. No acute osseous abnormality. IMPRESSION: 1. New appropriately positioned left chest wall port catheter without complicating feature. 2. Small area of nodularity at the left lung base. Metastatic disease is not excluded. Electronically Signed   By: Titus Dubin M.D.   On: 07/21/2019 19:39   Dg Abd Portable 1 View  Result Date: 08/07/2019 CLINICAL DATA:  NG tube placement EXAM: PORTABLE ABDOMEN - 1 VIEW COMPARISON:  CT abdomen/pelvis 08/07/2019 FINDINGS: Nasogastric tube with the tip projecting over the stomach. There is ill-defined dilatation of the small bowel consistent with bowel obstruction. There is no evidence of pneumoperitoneum, portal venous gas or pneumatosis. There are no pathologic calcifications along the expected course of the ureters. Right percutaneous nephrostomy tube. The osseous structures are unremarkable. IMPRESSION: 1. Nasogastric tube with the tip projecting over the stomach. Electronically Signed   By: Kathreen Devoid   On:  08/07/2019 17:39   Dg C-arm 1-60 Min-no Report  Result Date: 07/21/2019 Fluoroscopy was utilized by the requesting physician.  No radiographic interpretation.    PERFORMANCE STATUS (ECOG) : 3 - Symptomatic, >50% confined to bed  Review of Systems Unless otherwise noted, a complete review of systems is negative.  Physical Exam General: NAD, frail appearing, thin Pulmonary: Unlabored Abdomen: soft, nontender, + bowel sounds GU: no suprapubic tenderness Extremities: no edema, no joint deformities Skin: no rashes Neurological: Weakness but otherwise nonfocal  IMPRESSION: Patient clinically improving.  Had clamping trial and NG key was removed.  Is tolerating oral fluids.  Seems to be doing well.  No acute or distressing symptoms today.  Patient is optimistic regarding his improvement.  Case discussed with Dr. Tasia Catchings.  PLAN: -Continue current prescription treatment -Will follow    Time Total: 15 minutes  Visit consisted of counseling and education dealing with the complex and emotionally intense issues of symptom management and palliative care in the setting of serious and potentially life-threatening illness.Greater than 50%  of this time was spent counseling and coordinating care related to the above assessment and plan.  Signed by: Altha Harm, PhD, NP-C

## 2019-08-13 LAB — BASIC METABOLIC PANEL
Anion gap: 6 (ref 5–15)
BUN: 22 mg/dL (ref 8–23)
CO2: 23 mmol/L (ref 22–32)
Calcium: 8.3 mg/dL — ABNORMAL LOW (ref 8.9–10.3)
Chloride: 109 mmol/L (ref 98–111)
Creatinine, Ser: 0.66 mg/dL (ref 0.61–1.24)
GFR calc Af Amer: >60 mL/min (ref >60)
GFR calc non Af Amer: >60 mL/min (ref >60)
Glucose, Bld: 146 mg/dL — ABNORMAL HIGH (ref 70–99)
Potassium: 3.8 mmol/L (ref 3.5–5.1)
Sodium: 138 mmol/L (ref 135–145)

## 2019-08-13 LAB — CBC
HCT: 39.7 % (ref 39.0–52.0)
Hemoglobin: 13 g/dL (ref 13.0–17.0)
MCH: 29.3 pg (ref 26.0–34.0)
MCHC: 32.7 g/dL (ref 30.0–36.0)
MCV: 89.4 fL (ref 80.0–100.0)
Platelets: 107 10*3/uL — ABNORMAL LOW (ref 150–400)
RBC: 4.44 MIL/uL (ref 4.22–5.81)
RDW: 14.7 % (ref 11.5–15.5)
WBC: 12 10*3/uL — ABNORMAL HIGH (ref 4.0–10.5)
nRBC: 0 % (ref 0.0–0.2)

## 2019-08-13 MED ORDER — LIDOCAINE-EPINEPHRINE 1 %-1:100000 IJ SOLN
10.0000 mL | Freq: Once | INTRAMUSCULAR | Status: DC
  Filled 2019-08-13: qty 10

## 2019-08-13 MED ORDER — LIDOCAINE-EPINEPHRINE 1 %-1:100000 IJ SOLN: 10.0000 mL | Freq: Once | INTRAMUSCULAR | Status: DC

## 2019-08-13 MED ORDER — LIDOCAINE-EPINEPHRINE 1 %-1:100000 IJ SOLN
10.0000 mL | Freq: Once | INTRAMUSCULAR | Status: DC
  Filled 2019-08-13 (×2): qty 10

## 2019-08-13 NOTE — Progress Notes (Signed)
Subjective:  CC: Johnny Martin is a 68 y.o. male  Hospital stay day 6,   Small bowel obstruction and open wound at the port site  HPI: No acute events overnight from GI standpoint.  Tolerated clears.  However, was notified by primary doctor this morning that his surgical wound at his port site has been open since yesterday.  Patient denies any pain at the port site.  States he is having a lot of hiccups.  ROS:  General: Denies weight loss, weight gain, fatigue, fevers, chills, and night sweats. Heart: Denies chest pain, palpitations, racing heart, irregular heartbeat, leg pain or swelling, and decreased activity tolerance. Respiratory: Denies breathing difficulty, shortness of breath, wheezing, cough, and sputum. GI: Denies change in appetite, heartburn, nausea, vomiting, constipation, diarrhea, and blood in stool. GU: Denies difficulty urinating, pain with urinating, urgency, frequency, blood in urine.   Objective:   Temp:  [97.3 F (36.3 C)-98.2 F (36.8 C)] 97.6 F (36.4 C) (10/31 1128) Pulse Rate:  [81-104] 104 (10/31 1128) Resp:  [16-20] 18 (10/31 1128) BP: (129-159)/(78-91) 129/87 (10/31 1128) SpO2:  [95 %-97 %] 96 % (10/31 1128) Weight:  [73.3 kg] 73.3 kg (10/31 0354)       Weight: 73.3 kg BMI (Calculated): 21.32   Intake/Output this shift:   Intake/Output Summary (Last 24 hours) at 08/13/2019 1448 Last data filed at 08/13/2019 1023 Gross per 24 hour  Intake 740.12 ml  Output 675 ml  Net 65.12 ml    Constitutional :  alert, cooperative, appears stated age and no distress  Respiratory:  clear to auscultation bilaterally  Cardiovascular:  regular rate and rhythm  Gastrointestinal: Soft, no guarding, but some focal tenderness noted in the right lower quadrant along with obvious distention on exam..   Skin: Cool and moist.  Incision site at port placement is open with clearly visible port in the subcutaneous layer.  No obvious signs of drainage, erythema, or induration  to indicate an active local infection.  Psychiatric: Normal affect, non-agitated, not confused       LABS:  CMP Latest Ref Rng & Units 08/13/2019 08/12/2019 08/11/2019  Glucose 70 - 99 mg/dL 146(H) 132(H) 102(H)  BUN 8 - 23 mg/dL 22 26(H) 26(H)  Creatinine 0.61 - 1.24 mg/dL 0.66 0.81 0.90  Sodium 135 - 145 mmol/L 138 143 145  Potassium 3.5 - 5.1 mmol/L 3.8 4.1 4.1  Chloride 98 - 111 mmol/L 109 112(H) 114(H)  CO2 22 - 32 mmol/L 23 16(L) 15(L)  Calcium 8.9 - 10.3 mg/dL 8.3(L) 8.5(L) 8.6(L)  Total Protein 6.5 - 8.1 g/dL - - -  Total Bilirubin 0.3 - 1.2 mg/dL - - -  Alkaline Phos 38 - 126 U/L - - -  AST 15 - 41 U/L - - -  ALT 0 - 44 U/L - - -   CBC Latest Ref Rng & Units 08/13/2019 08/12/2019 08/11/2019  WBC 4.0 - 10.5 K/uL 12.0(H) 11.7(H) 7.7  Hemoglobin 13.0 - 17.0 g/dL 13.0 12.1(L) 13.1  Hematocrit 39.0 - 52.0 % 39.7 37.2(L) 40.8  Platelets 150 - 400 K/uL 107(L) 134(L) 144(L)    RADS: N/A Assessment:   Small bowel obstruction-tolerated clears yesterday and advance to full's for breakfast, but has obvious distention on physical exam.  There is noted to be a bowel movement was recorded this a.m. as well.  Patient states he is having some hiccups but denies any obvious nausea or vomiting.  Recommend continuing to stay on full liquid diet for today and continue  to monitor very closely.  Regarding the open wound and exposed port, had an extensive discussion with Dr. Tasia Catchings regarding the necessity of replacing the port for subsequent chemotherapy.  She stated that with his very poor prognosis, the possibility of recurrent bowel obstructions, comfort care care measures may need to be considered prior to continuing with any additional rounds of palliative chemotherapy.  She stated that there is no rush to replace the port during this hospital stay since the chemotherapy regimen that was initially planned could potentially be placed peripherally, until he is deemed well enough to continue the  course.  Plan is to remove the port at bedside, and continue with local wound care until a decision can be made regarding port replacement.

## 2019-08-13 NOTE — Progress Notes (Signed)
Patient has open area to left chest wall Port-A-Cath site. MD updated. Temporary treatment orders placed and WOC consult placed. Will endorse.

## 2019-08-13 NOTE — Progress Notes (Addendum)
[place] [facility] [place] NAME: Cloyd Volcy    MR#:  [id number]  DATE OF BIRTH:  20-Aug-1951  SUBJECTIVE:   Patient is doing well this morning.  He was able to tolerate a clear liquid diet all day yesterday without any symptoms.  He did feel like an area of his stomach became tight after eating, but that feeling resolved.  He denies any abdominal pain, nausea, or vomiting this morning.  He did have 1 bowel movement yesterday.  REVIEW OF SYSTEMS:  Review of Systems  Constitutional: Negative for chills and fever.  HENT: Negative.  Negative for congestion and sore throat.   Eyes: Negative for blurred vision and double vision.  Respiratory: Negative for cough and shortness of breath.   Cardiovascular: Negative for chest pain and palpitations.  Gastrointestinal: Negative for abdominal pain, diarrhea, nausea and vomiting.  Genitourinary: Negative for dysuria and urgency.  Musculoskeletal: Negative for back pain and neck pain.  Neurological: Negative for dizziness and headaches.  Psychiatric/Behavioral: Negative for depression. The patient is not nervous/anxious.    DRUG ALLERGIES:  No Known Allergies VITALS:  Blood pressure (!) 142/78, pulse 81, temperature 97.7 F (36.5 C), temperature source Oral, resp. rate 20, weight 73.3 kg, SpO2 95 %. PHYSICAL EXAMINATION:  Physical Exam  GENERAL:  Laying in the bed with no acute distress.  HEENT: Head atraumatic, normocephalic. Pupils equal, round, reactive to light and accommodation. No scleral icterus. Extraocular muscles intact. NG tube in place. NECK:  Supple, no jugular venous distention. No thyroid enlargement. LUNGS: Lungs are clear to auscultation bilaterally. No wheezes, crackles, rhonchi. No use of accessory muscles of respiration.  CARDIOVASCULAR: RRR, S1, S2 normal. No murmurs, rubs, or gallops.  ABDOMEN: Soft. No distension, no tenderness to palpation.  No rebound or guarding. +BS. EXTREMITIES:  No pedal edema, cyanosis, or clubbing.  NEUROLOGIC: CN 2-12 intact, no focal deficits. +global weakness. Sensation intact throughout. Gait not checked.  PSYCHIATRIC: The patient is alert and oriented x 3.  SKIN: No obvious rash, lesion, or ulcer. +port on left side of chest with opening of the overlying skin, port now exposed. No drainage. LABORATORY PANEL:  Male CBC Recent Labs  Lab 08/13/19 0611  WBC 12.0*  HGB 13.0  HCT 39.7  PLT 107*   ------------------------------------------------------------------------------------------------------------------ Chemistries  Recent Labs  Lab 08/07/19 1115  08/13/19 0611  NA 133*   < > 138  K 4.8   < > 3.8  CL 90*   < > 109  CO2 23   < > 23  GLUCOSE 142*   < > 146*  BUN 45*   < > 22  CREATININE 1.45*   < > 0.66  CALCIUM 9.1   < > 8.3*  AST 32  --   --   ALT 18  --   --   ALKPHOS 143*  --   --   BILITOT 1.4*  --   --    < > = values in this interval not displayed.   RADIOLOGY:  No results found. ASSESSMENT AND PLAN:   Small bowel obstruction due to metastatic bladder cancer and peritoneal carcinomatosis- improving.  NG tube removed 10/29. -Surgery following -Continue octreotide, reglan -Tapering dexamethasone per oncology -Advance diet to full liquids today -Stop IVFs -Pain control and IV anti-emetics -Palliative care following -PT recommending outpatient PT  Leukocytosis- likely due to steroids. No signs of infection. -Recheck CBC in the morning  Port issue- patient has had some  skin opening over his port site with port now exposed. -I have asked surgery to evaluate to see if it needs to be closed -Local wound care  Metastatic bladder cancer with peritoneal carcinomatosis with nephrostomy tube in place -Oncology following -Palliative care consult  Tobacco use -Continue nicotine patch  Plan for likely discharge home tomorrow. Daughter, [name], updated via the phone.  All the records are reviewed and case discussed  with Care Management/Social Worker. Management plans discussed with the patient, family and they are in agreement.  CODE STATUS: DNR  TOTAL TIME TAKING CARE OF THIS PATIENT: 36 minutes.   More than 50% of the time was spent in counseling/coordination of care: YES  POSSIBLE D/C IN 1-2 DAYS, DEPENDING ON CLINICAL CONDITION.   Berna Spare Anzleigh Slaven M.D on 08/13/2019 at 10:55 AM  Between 7am to 6pm - Pager - [phone]  After 6pm go to [email or web address] - Technical brewer [facility]  Office  [phone]  CC: Primary care physician; Earlie Server, MD  Note: This dictation was prepared with Dragon dictation along with smaller phrase technology. Any transcriptional errors that result from this process are unintentional.

## 2019-08-14 DIAGNOSIS — D696 Thrombocytopenia, unspecified: Secondary | ICD-10-CM

## 2019-08-14 LAB — BASIC METABOLIC PANEL
Anion gap: 8 (ref 5–15)
BUN: 23 mg/dL (ref 8–23)
CO2: 22 mmol/L (ref 22–32)
Calcium: 8.2 mg/dL — ABNORMAL LOW (ref 8.9–10.3)
Chloride: 104 mmol/L (ref 98–111)
Creatinine, Ser: 0.72 mg/dL (ref 0.61–1.24)
GFR calc Af Amer: >60 mL/min (ref >60)
GFR calc non Af Amer: >60 mL/min (ref >60)
Glucose, Bld: 115 mg/dL — ABNORMAL HIGH (ref 70–99)
Potassium: 3.8 mmol/L (ref 3.5–5.1)
Sodium: 134 mmol/L — ABNORMAL LOW (ref 135–145)

## 2019-08-14 LAB — CBC
HCT: 40.9 % (ref 39.0–52.0)
Hemoglobin: 13.7 g/dL (ref 13.0–17.0)
MCH: 29.4 pg (ref 26.0–34.0)
MCHC: 33.5 g/dL (ref 30.0–36.0)
MCV: 87.8 fL (ref 80.0–100.0)
Platelets: 74 10*3/uL — ABNORMAL LOW (ref 150–400)
RBC: 4.66 MIL/uL (ref 4.22–5.81)
RDW: 14.8 % (ref 11.5–15.5)
WBC: 11.4 10*3/uL — ABNORMAL HIGH (ref 4.0–10.5)
nRBC: 0 % (ref 0.0–0.2)

## 2019-08-14 MED ORDER — FUROSEMIDE 40 MG PO TABS
40.0000 mg | ORAL_TABLET | Freq: Every day | ORAL | Status: AC
  Administered 2019-08-14 – 2019-08-15 (×2): 40 mg via ORAL
  Filled 2019-08-14 (×2): qty 1

## 2019-08-14 NOTE — Progress Notes (Addendum)
PROGRESS NOTE  Johnny Martin [id number] DOB: February 09, 1951 DOA: 08/07/2019 PCP: Earlie Server, MD  Brief History   68 year old man PMH metastatic bladder cancer presented with nausea and vomiting.  Admitted for small bowel obstruction, seen by general surgery, because of peritoneal carcinomatosis, palliative oncology consultations were obtained.  He was treated with octreotide and steroids.  He developed an open area over his Port-A-Cath site.  Port was removed by surgery.  A & P  Small bowel obstruction secondary metastatic bladder cancer, peritoneal carcinomatosis.  Not a surgical candidate secondary to extensive metastatic burden in abdomen.  Followed by general surgery.  Treated with octreotide and metoclopramide. --Continue management per surgery.  Plan to continue current management 1 more day, if tolerates diet may be able to go home tomorrow. --Tapering dexamethasone per oncology  Scrotal edema, +2.7 L since admission --Should improve with short-term Lasix.  Thrombocytopenia --Etiology unclear.  Hold enoxaparin, repeat CBC in a.m.  Status post port removal for skin erosion.  Metastatic bladder cancer.  Prognosis is poor.  Palliative chemotherapy plus/minus immunotherapy delayed secondary to small bowel obstruction.  Status post nephrostomy tube on the right. --Follow-up with oncology as an outpatient in 1 to 2 days after discharge.  Oncology plans to switch to long-acting Sandostatin as an outpatient.  Acute kidney injury  Elevated TSH --Follow-up as an outpatient  Severe malnutrition in the context of chronic illness  Aortic atherosclerosis  Emphysema  Sepsis secondary to UTI considered on admission.  However this was ruled out with negative cultures.  Blood cultures no growth.  Urine culture with multiple species.  Antibiotics stopped.  DVT prophylaxis: SCDs Code Status: DNR Family Communication: daughter at bedside Disposition Plan: home with outpatient PT   Murray Hodgkins, MD  [facility] Direct contact: see [email or web address] (further directions at bottom of note if needed) 7PM-7AM contact night coverage as at bottom of note 08/14/2019, 5:22 PM  LOS: 7 days   Significant Hospital Events   . 10/25 admitted for small bowel obstruction.  General surgery consultation . 10/26 oncology consultation . 10/27 palliative medicine consultation   Consults:  . General surgery . Oncology . Palliative medicine   Procedures:  . 11/1 bedside removal of Port-A-Cath  Significant Diagnostic Tests:  . SARS Covid negative . CT abdomen pelvis: Bulky metastatic disease involving the omentum, multiple loops of small bowel, anterior abdominal wall.  Stomach and proximal to mid small bowel diffusely distended with transition point noted consistent with small bowel obstruction.   Micro Data:  . Blood cultures no growth, final . Urine culture multiple species present   Antimicrobials:  .   Interval History/Subjective  Overall feels okay.  No abdominal pain nausea or vomiting.  Tolerating diet.  Does report scrotal and penile swelling.  Objective   Vitals:  Vitals:   08/14/19 1415 08/14/19 1645  BP: (!) 150/88 (!) 121/96  Pulse: 94 (!) 107  Resp: 18 18  Temp: 97.6 F (36.4 C) 97.6 F (36.4 C)  SpO2: 96% 100%    Exam:  Constitutional: Appears calm, comfortable Cardiovascular: Regular rate and rhythm.  No murmur, rub or gallop.  No lower extremity edema other than upper thigh edema and scrotal edema as below. Respiratory: Clear to auscultation bilaterally.  No wheezes, rales or rhonchi.  Normal respiratory effort. Abdomen: Soft, nontender, nondistended, firm mass in suprapubic area GU: Significant edema of the scrotum and penis Psychiatric: Grossly normal mood and affect.  Speech fluent and appropriate.  I have personally reviewed the following:  Today's Data  . BMP unremarkable . CBC notable for platelets of 74, remainder CBC unremarkable   Scheduled Meds: . Chlorhexidine Gluconate Cloth  6 each Topical Daily  . dexamethasone  4 mg Intravenous Q12H  . docusate sodium  100 mg Oral BID  . furosemide  40 mg Oral Daily  . lidocaine-EPINEPHrine  10 mL Intradermal Once  . metoCLOPramide (REGLAN) injection  10 mg Intravenous Q8H  . metoprolol tartrate  25 mg Oral BID  . nicotine  21 mg Transdermal Daily  . octreotide  100 mcg Subcutaneous Q12H  . pantoprazole  40 mg Oral Daily  . senna-docusate  2 tablet Oral Daily   Continuous Infusions: . 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  1. Check the care team in [facility] and look for a) attending/consulting TRH provider listed and b) the [facility] team listed 2. Log into [email or web address] and use [place]'s universal password to access. If you do not have the password, please contact the hospital operator. 3. Locate the [facility] provider you are looking for under [facility] and page to a number that you can be directly reached. 4. If you still have difficulty reaching the provider, please page the [facility] (Director on Call) for the Hospitalists listed on amion for assistance.

## 2019-08-14 NOTE — TOC Progression Note (Signed)
Transition of Care [facility]) - Progression Note    Patient Details  Name: Johnny Martin MRN: [id number] Date of Birth: [birth year]  Transition of [facility]) CM/SW Contact  Katrina Stack, RN Phone Number: 08/14/2019, 8:33 AM  Clinical Narrative:   Advanced to full liquids.  has had a BM. Port a cath removed due to dehiscence at site. Decision to continue with outpatient chemotherapy due to poor prognosis is being discussed.  Will not need port a cath reinsertion prior to discharge.         Expected Discharge Plan and Services                                                 Social Determinants of Health (SDOH) Interventions    Readmission Risk Interventions No flowsheet data found.

## 2019-08-14 NOTE — Op Note (Signed)
Preoperative diagnosis: Port site dehiscence Postoperative diagnosis: same  Procedure: Bedside removal of port cath  Anesthesia: None  Surgeon: Lysle Pearl  Wound Classification: Contaminated  Indications: Patient is a 68 y.o. male  presented with port site dehiscence with exposed port.  In order to prevent infection including septicemia, decision was made to proceed with bedside port removal..  See progress note for further details.  Specimen: None  Complications: None  Estimated Blood Loss: None mL  Findings:  1.  Easy removal Port-A-Cath with tip in place 2.  No obvious drainage of purulence or blood afterwards     Description of procedure: The patient was placed in the supine position. The area was inspected and the wound noted to be completely open, with exposed port, and visible retention sutures.  The retention sutures were removed at bedside without any issues.  The port was then easily pulled out from the area, with tip of cath noted to be in place.  Pressure was applied to the catheter insertion site in the subclavian region for 10 minutes.  Afterwards, inspection of the area noted no obvious hemorrhage, swelling, or purulent drainage.  Wound was packed with wet 4 x 4, cover with dry 4 x 4, and secured with paper tape.  Patient tolerated procedure well.

## 2019-08-14 NOTE — Progress Notes (Signed)
Subjective:  CC: Johnny Martin is a 68 y.o. male  Hospital stay day 7,   Small bowel obstruction and open wound at the port site  HPI: No acute events overnight.  Tolerated full liquid diet.  2 recorded bowel movements.  Patient denies any pain at the port site.   ROS:  General: Denies weight loss, weight gain, fatigue, fevers, chills, and night sweats. Heart: Denies chest pain, palpitations, racing heart, irregular heartbeat, leg pain or swelling, and decreased activity tolerance. Respiratory: Denies breathing difficulty, shortness of breath, wheezing, cough, and sputum. GI: Denies change in appetite, heartburn, nausea, vomiting, constipation, diarrhea, and blood in stool. GU: Denies difficulty urinating, pain with urinating, urgency, frequency, blood in urine.   Objective:   Temp:  [96.7 F (35.9 C)-97.7 F (36.5 C)] 97.7 F (36.5 C) (11/01 0511) Pulse Rate:  [86-104] 87 (11/01 0511) Resp:  [16-18] 18 (11/01 0511) BP: (111-136)/(69-87) 111/69 (11/01 0511) SpO2:  [94 %-96 %] 94 % (11/01 0511) Weight:  [72.4 kg] 72.4 kg (11/01 0318)       Weight: 72.4 kg BMI (Calculated): 21.06   Intake/Output this shift:   Intake/Output Summary (Last 24 hours) at 08/14/2019 [id number] Last data filed at 08/13/2019 1757 Gross per 24 hour  Intake 120 ml  Output 700 ml  Net -580 ml    Constitutional :  alert, cooperative, appears stated age and no distress  Respiratory:  clear to auscultation bilaterally  Cardiovascular:  regular rate and rhythm  Gastrointestinal: soft, non-tender; bowel sounds normal; no masses,  no organomegaly. Slight distention with palpable loops of bowel through abdominal wall in epigastric region  Skin: Cool and moist.  Port site dressing intact, dry.  Psychiatric: Normal affect, non-agitated, not confused       LABS:  CMP Latest Ref Rng & Units 08/14/2019 08/13/2019 08/12/2019  Glucose 70 - 99 mg/dL 115(H) 146(H) 132(H)  BUN 8 - 23 mg/dL 23 22 26(H)  Creatinine 0.61 -  1.24 mg/dL 0.72 0.66 0.81  Sodium 135 - 145 mmol/L 134(L) 138 143  Potassium 3.5 - 5.1 mmol/L 3.8 3.8 4.1  Chloride 98 - 111 mmol/L 104 109 112(H)  CO2 22 - 32 mmol/L 22 23 16(L)  Calcium 8.9 - 10.3 mg/dL 8.2(L) 8.3(L) 8.5(L)  Total Protein 6.5 - 8.1 g/dL - - -  Total Bilirubin 0.3 - 1.2 mg/dL - - -  Alkaline Phos 38 - 126 U/L - - -  AST 15 - 41 U/L - - -  ALT 0 - 44 U/L - - -   CBC Latest Ref Rng & Units 08/14/2019 08/13/2019 08/12/2019  WBC 4.0 - 10.5 K/uL 11.4(H) 12.0(H) 11.7(H)  Hemoglobin 13.0 - 17.0 g/dL 13.7 13.0 12.1(L)  Hematocrit 39.0 - 52.0 % 40.9 39.7 37.2(L)  Platelets 150 - 400 K/uL 74(L) 107(L) 134(L)    RADS: N/A Assessment:   Small bowel obstruction-bowel movements recorded yesterday and tolerated a full liquid diet.  He states that his abdomen does not feel distended, bothering exam it is hypertympanic with slight distention.  Likely okay to advance diet as tolerated.  Port site dressing remains clean and dry.  Recommend keeping in place for an additional 24 hours prior to removing.  And if wound looks clean, patient will need daily wet-to-dry dressing changes to the area to heal.  Below note copied from yesterday's progress note: Regarding the open wound and exposed port, had an extensive discussion with Dr. Tasia Catchings regarding the necessity of replacing the port for subsequent chemotherapy.  She stated that with his very poor prognosis, the possibility of recurrent bowel obstructions, comfort care care measures may need to be considered prior to continuing with any additional rounds of palliative chemotherapy.  She stated that there is no rush to replace the port during this hospital stay since the chemotherapy regimen that was initially planned could potentially be placed peripherally, until he is deemed well enough to continue the course.  Plan is to remove the port at bedside, and continue with local wound care until a decision can be made regarding port replacement.

## 2019-08-15 DIAGNOSIS — D696 Thrombocytopenia, unspecified: Secondary | ICD-10-CM

## 2019-08-15 LAB — CBC WITH DIFFERENTIAL/PLATELET
Abs Immature Granulocytes: 0.17 10*3/uL — ABNORMAL HIGH (ref 0.00–0.07)
Basophils Absolute: 0 10*3/uL (ref 0.0–0.1)
Basophils Relative: 0 %
Eosinophils Absolute: 0 10*3/uL (ref 0.0–0.5)
Eosinophils Relative: 0 %
HCT: 40.9 % (ref 39.0–52.0)
Hemoglobin: 13.6 g/dL (ref 13.0–17.0)
Immature Granulocytes: 2 %
Lymphocytes Relative: 11 %
Lymphs Abs: 1.1 10*3/uL (ref 0.7–4.0)
MCH: 28.9 pg (ref 26.0–34.0)
MCHC: 33.3 g/dL (ref 30.0–36.0)
MCV: 87 fL (ref 80.0–100.0)
Monocytes Absolute: 0.7 10*3/uL (ref 0.1–1.0)
Monocytes Relative: 7 %
Neutro Abs: 8.4 10*3/uL — ABNORMAL HIGH (ref 1.7–7.7)
Neutrophils Relative %: 80 %
Platelets: 61 10*3/uL — ABNORMAL LOW (ref 150–400)
RBC: 4.7 MIL/uL (ref 4.22–5.81)
RDW: 14.6 % (ref 11.5–15.5)
WBC: 10.4 10*3/uL (ref 4.0–10.5)
nRBC: 0 % (ref 0.0–0.2)

## 2019-08-15 LAB — IMMATURE PLATELET FRACTION: Immature Platelet Fraction: 15.3 % — ABNORMAL HIGH (ref 1.2–8.6)

## 2019-08-15 MED ORDER — BOOST / RESOURCE BREEZE PO LIQD CUSTOM
1.0000 | Freq: Three times a day (TID) | ORAL | Status: DC
  Administered 2019-08-15 – 2019-08-19 (×12): 1 via ORAL

## 2019-08-15 MED ORDER — FUROSEMIDE 40 MG PO TABS: 40.0000 mg | ORAL_TABLET | Freq: Once | ORAL | Status: DC

## 2019-08-15 MED ORDER — FUROSEMIDE 40 MG PO TABS
40.0000 mg | ORAL_TABLET | Freq: Once | ORAL | Status: AC
  Administered 2019-08-16: 09:00:00 40 mg via ORAL
  Filled 2019-08-15: qty 1

## 2019-08-15 NOTE — Progress Notes (Addendum)
Hematology/Oncology Progress Note [facility] Telephone:([phone] [phone]  Patient Care Team: Earlie Server, MD as PCP - General (Oncology)   Name of the patient: Johnny Martin  [id number]  1950/10/16  Date of visit: 08/15/19   INTERVAL HISTORY-  Patient was seen at the bedside.   Mediport has been removed during the weekend due to being fully exposed to air. He tolerates clear liquid diet. Over the weekend his belly was more distended after trying soft diet. Today he denies any pain.  Nausea, taking nausea pills. He passes flatus.  Last bowel movement was the day before.    Review of systems-  Review of Systems  Constitutional: Positive for appetite change, fatigue and unexpected weight change. Negative for chills and fever.  HENT:   Negative for hearing loss and voice change.   Eyes: Negative for eye problems and icterus.  Respiratory: Negative for chest tightness, cough, hemoptysis and shortness of breath.   Cardiovascular: Negative for chest pain and leg swelling.  Gastrointestinal: Positive for abdominal distention. Negative for abdominal pain.  Endocrine: Negative for hot flashes.  Genitourinary: Negative for difficulty urinating, dysuria and frequency.   Musculoskeletal: Negative for arthralgias.  Skin: Negative for itching and rash.  Neurological: Negative for light-headedness and numbness.  Hematological: Negative for adenopathy. Does not bruise/bleed easily.  Psychiatric/Behavioral: Negative for confusion. The patient is not nervous/anxious.      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[place])    Protein-calorie malnutrition, severe 08/08/2019   SBO (small bowel obstruction) ([place]) 08/07/2019   Goals of care, counseling/discussion 07/21/2019   Bladder cancer ([place]) 07/21/2019    Drug-induced constipation    Bladder mass 07/11/2019   Hydronephrosis due to obstruction of ureter 07/11/2019   Gross hematuria    Peritoneal carcinomatosis ([facility])    Mass of left side of neck    AKI (acute kidney injury) ([place])      Past Medical History:  Diagnosis Date   Bladder cancer ([place]) 07/21/2019   Cancer ([place])    Bladder cancer     Past Surgical History:  Procedure Laterality Date   BLADDER SURGERY     BRAIN SURGERY     plate in head per patient since childhood    PORTACATH PLACEMENT Left 07/21/2019   Procedure: INSERTION PORT-A-CATH;  Surgeon: Olean Ree, MD;  Location: [facility] ORS;  Service: General;  Laterality: Left;    Social History   Socioeconomic History   Marital status: Divorced    Spouse name: Not on file   Number of children: Not on file   Years of education: Not on file   Highest education level: Not on file  Occupational History   Not on file  Social Needs   Financial resource strain: Not on file   Food insecurity    Worry: Not on file    Inability: Not on file   Transportation needs    Medical: Not on file    Non-medical: Not on file  Tobacco Use   Smoking status: Former Smoker    Packs/day: 0.50    Quit date: 07/13/2019    Years since quitting: 0.0   Smokeless tobacco: Never Used  Substance and Sexual Activity   Alcohol use: Not Currently    Frequency: Never    Comment: quit 3 years ago    Drug use:  Never   Sexual activity: Not on file  Lifestyle   Physical activity    Days per week: Not on file    Minutes per session: Not on file   Stress: Not on file  Relationships   Social connections    Talks on phone: Not on file    Gets together: Not on file    Attends religious service: Not on file    Active member of club or organization: Not on file    Attends meetings of clubs or organizations: Not on file    Relationship status: Not on file   Intimate partner violence    Fear of current or ex partner: Not  on file    Emotionally abused: Not on file    Physically abused: Not on file    Forced sexual activity: Not on file  Other Topics Concern   Not on file  Social History Narrative   Lives at home with daughter and son in [place].     Family History  Problem Relation Age of Onset   Cervical cancer Sister    Kidney failure Mother    Stroke Mother    Alzheimer's disease Father      Current Facility-Administered Medications:    0.9 %  sodium chloride infusion, , Intravenous, Continuous, Monia Sabal, PA-C   acetaminophen (TYLENOL) tablet 650 mg, 650 mg, Oral, Q6H PRN **OR** acetaminophen (TYLENOL) suppository 650 mg, 650 mg, Rectal, Q6H PRN, Harrie Foreman, MD   Chlorhexidine Gluconate Cloth 2 % PADS 6 each, 6 each, Topical, Daily, Harrie Foreman, MD, 6 each at 08/15/19 1404   dexamethasone (DECADRON) injection 4 mg, 4 mg, Intravenous, Q12H, Earlie Server, MD, 4 mg at 08/15/19 [id number]   docusate sodium (COLACE) capsule 100 mg, 100 mg, Oral, BID, Harrie Foreman, MD, 100 mg at 08/15/19 [id number]   feeding supplement (BOOST / RESOURCE BREEZE) liquid 1 Container, 1 Container, Oral, TID BM, Tylene Fantasia, PA-C, 1 Container at 08/15/19 1405   HYDROmorphone (DILAUDID) injection 0.5 mg, 0.5 mg, Intravenous, Q3H PRN, Mayo, Pete Pelt, MD, 0.5 mg at 08/15/19 1308   lidocaine-EPINEPHrine (XYLOCAINE W/EPI) 1 %-1:100000 (with pres) injection 10 mL, 10 mL, Intradermal, Once, Mayo, Pete Pelt, MD   metoCLOPramide (REGLAN) injection 10 mg, 10 mg, Intravenous, Q8H, Earlie Server, MD, 10 mg at 08/15/19 1404   metoprolol tartrate (LOPRESSOR) tablet 25 mg, 25 mg, Oral, BID, Mayo, Pete Pelt, MD, 25 mg at 08/15/19 [id number]   nicotine (NICODERM CQ - dosed in mg/24 hours) patch 21 mg, 21 mg, Transdermal, Daily, Harrie Foreman, MD, 21 mg at 08/14/19 2210   octreotide (SANDOSTATIN) injection 100 mcg, 100 mcg, Subcutaneous, Q12H, Piscoya, Jose, MD, 100 mcg at 08/15/19 1014   ondansetron (ZOFRAN) tablet 4  mg, 4 mg, Oral, Q6H PRN **OR** ondansetron (ZOFRAN) injection 4 mg, 4 mg, Intravenous, Q6H PRN, Harrie Foreman, MD, 4 mg at 08/13/19 0353   pantoprazole (PROTONIX) EC tablet 40 mg, 40 mg, Oral, Daily, Mayo, Pete Pelt, MD, 40 mg at 08/15/19 0904   polyethylene glycol (MIRALAX / GLYCOLAX) packet 17 g, 17 g, Oral, Daily PRN, Harrie Foreman, MD   prochlorperazine (COMPAZINE) injection 10 mg, 10 mg, Intravenous, Q6H PRN, Harrie Foreman, MD   senna-docusate (Senokot-S) tablet 2 tablet, 2 tablet, Oral, Daily, Harrie Foreman, MD, 2 tablet at 08/15/19 0903   Physical exam:  Vitals:   08/14/19 1645 08/14/19 1946 08/15/19 0458 08/15/19 0855  BP: (!) 121/96 (!) 146/98 113/77 Marland Kitchen)  145/86  Pulse: (!) 107 98 85 95  Resp: 18 17 17 20   Temp: 97.6 F (36.4 C) 97.6 F (36.4 C) (!) 97.2 F (36.2 C) 98.4 F (36.9 C)  TempSrc: Oral Oral Axillary Oral  SpO2: 100% 95% 95% 98%  Weight:       Physical Exam  Constitutional: He is oriented to person, place, and time. No distress.  Cachectic  HENT:  Head: Normocephalic and atraumatic.  Nose: Nose normal.  Mouth/Throat: Oropharynx is clear and moist. No oropharyngeal exudate.  Eyes: Pupils are equal, round, and reactive to light. EOM are normal. No scleral icterus.  Neck: Normal range of motion. Neck supple.  Cardiovascular: Normal rate and regular rhythm.  No murmur heard. Pulmonary/Chest: Effort normal. No respiratory distress. He has no rales. He exhibits no tenderness.  Abdominal: Soft. He exhibits distension. There is no abdominal tenderness.  Sluggish bowel sounds  Nephrostomy tube  Musculoskeletal: Normal range of motion.        General: No edema.  Neurological: He is alert and oriented to person, place, and time. No cranial nerve deficit. He exhibits normal muscle tone. Coordination normal.  Skin: Skin is warm and dry. He is not diaphoretic. No erythema.  Psychiatric: Affect normal.      CMP Latest Ref Rng & Units  08/14/2019  Glucose 70 - 99 mg/dL 115(H)  BUN 8 - 23 mg/dL 23  Creatinine 0.61 - 1.24 mg/dL 0.72  Sodium 135 - 145 mmol/L 134(L)  Potassium 3.5 - 5.1 mmol/L 3.8  Chloride 98 - 111 mmol/L 104  CO2 22 - 32 mmol/L 22  Calcium 8.9 - 10.3 mg/dL 8.2(L)  Total Protein 6.5 - 8.1 g/dL -  Total Bilirubin 0.3 - 1.2 mg/dL -  Alkaline Phos 38 - 126 U/L -  AST 15 - 41 U/L -  ALT 0 - 44 U/L -   CBC Latest Ref Rng & Units 08/15/2019  WBC 4.0 - 10.5 K/uL 10.4  Hemoglobin 13.0 - 17.0 g/dL 13.6  Hematocrit 39.0 - 52.0 % 40.9  Platelets 150 - 400 K/uL 61(L)    RADIOGRAPHIC STUDIES: I have personally reviewed the radiological images as listed and agreed with the findings in the report. Dg Abd 1 View  Result Date: 08/10/2019 CLINICAL DATA:  67 year old male with a history of small bowel obstruction EXAM: ABDOMEN - 1 VIEW COMPARISON:  08/09/2019 FINDINGS: Similar appearance of the lower chest. Gastric tube terminates within the stomach. Unchanged appearance of the right percutaneous nephrostomy. Dilated small bowel loops within the mid abdomen. Paucity of colonic and rectal gas. Small formed stool within the rectum. No unexpected radiopaque foreign body. No displaced fracture IMPRESSION: Dilated small bowel loops indicating ongoing obstruction/ileus. Relative absence of colonic gas. Unchanged gastric tube and right percutaneous nephrostomy. Electronically Signed   By: Corrie Mckusick D.O.   On: 08/10/2019 08:09   Dg Abd 1 View  Result Date: 08/09/2019 CLINICAL DATA:  Small bowel obstruction EXAM: ABDOMEN - 1 VIEW COMPARISON:  08/07/2019 FINDINGS: NG tube tip is within the fundus of the stomach, unchanged. Right side nephrostomy catheter again noted, unchanged. There are dilated small bowel loops compatible with small bowel obstruction, slightly progressed since prior study. No organomegaly or free air. IMPRESSION: Small-bowel dilatation slightly progressed since prior study. Findings compatible with continued  small bowel obstruction. Electronically Signed   By: Rolm Baptise M.D.   On: 08/09/2019 09:42   Ct Abdomen Pelvis W Contrast  Result Date: 08/07/2019 CLINICAL DATA:  Emesis for 2  days, history of bladder cancer EXAM: CT ABDOMEN AND PELVIS WITH CONTRAST TECHNIQUE: Multidetector CT imaging of the abdomen and pelvis was performed using the standard protocol following bolus administration of intravenous contrast. CONTRAST:  18mL OMNIPAQUE IOHEXOL 300 MG/ML  SOLN COMPARISON:  PET-CT, 07/25/2019 FINDINGS: Lower chest: No acute abnormality. Metastatic nodules in the partially included lingula (series 4, image 9). Emphysema. Hepatobiliary: No solid liver abnormality is seen. No gallstones, gallbladder wall thickening, or biliary dilatation. Pancreas: Unremarkable. No pancreatic ductal dilatation or surrounding inflammatory changes. Spleen: Normal in size without significant abnormality. Adrenals/Urinary Tract: Adrenal glands are unremarkable. Right-sided percutaneous nephrostomy with formed pigtail in the right renal pelvis. No hydronephrosis. Redemonstrated, bulky, predominantly endoluminal bladder mass and soft tissue nodules. Stomach/Bowel: There is redemonstrated, extensive, bulky, metastatic tissue involving the omentum, multiple loops of small bowel, and the anterior abdominal wall. The stomach and proximal to mid small bowel are diffusely fluid distended, with an abrupt transition point in the central abdomen abutting metastatic soft tissue (series 2, image 59) and decompression of the distal small bowel. Vascular/Lymphatic: Aortic atherosclerosis. Multiple enlarged retroperitoneal, iliac, and pelvic sidewall lymph nodes as seen on prior examination. Reproductive: No mass or other significant abnormality. Other: No abdominal wall hernia or abnormality. No abdominopelvic ascites. Musculoskeletal: No acute or significant osseous findings. IMPRESSION: 1. There is redemonstrated, extensive, bulky, metastatic tissue  involving the omentum, multiple loops of small bowel, and the anterior abdominal wall, not significant changed in appearance compared to prior PET-CT examination dated 07/25/2019. The stomach and proximal to mid small bowel are diffusely fluid distended, with an abrupt transition point in the central abdomen abutting metastatic soft tissue (series 2, image 59) and decompression of the distal small bowel. Findings are consistent with small bowel obstruction. 2. Other findings of primary bladder malignancy and advanced metastatic disease as detailed above, not significantly changed in comparison to PET-CT dated 07/25/2019. 3. Aortic Atherosclerosis (ICD10-I70.0) and Emphysema (ICD10-J43.9). Electronically Signed   By: Eddie Candle M.D.   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 EXAM: NUCLEAR MEDICINE PET SKULL BASE TO THIGH TECHNIQUE: 8.795 mCi F-18 FDG was injected intravenously. Full-ring PET imaging was performed from the skull base to thigh after the radiotracer. CT data was obtained and used for attenuation correction and anatomic localization. Fasting blood glucose: 93 mg/dl COMPARISON:  None. FINDINGS: Mediastinal blood pool activity: SUV max 1.36 . Liver activity: SUV max NA NECK: Enlarged and hypermetabolic left level 2 lymph nodes. Nodal mass measures 3.4 by 1.9 cm and has an SUV max of 7.06 Incidental CT findings: none CHEST: Hypermetabolic right paratracheal, subcarinal and bilateral hilar lymph nodes are identified. Right paratracheal node measures 1.4 cm and has an SUV max of 10.38. Index subcarinal node measures 1.1 cm and has an SUV max of 8.93. Index left hilar node measures 1.2 cm and has an SUV max of 6.41. Right hilar lymph node measures 1.2 cm and has an SUV max 8.66. Right CP angle lymph node is FDG avid measuring 1.4 cm with SUV max of 7.57. Bilateral FDG avid  pulmonary nodules. Right lower lobe lung nodule measures 1 cm and has an SUV max of 1.88. Hypermetabolic subpleural nodule in the lingula measures 1 cm and has an SUV max of 6.6. Incidental CT findings: Advanced changes of emphysema. Aortic atherosclerosis. ABDOMEN/PELVIS: There is a few small foci of FDG uptake above liver  background activity without definite CT correlate, equivocal for metastasis. For example, within segment 8 there is an area of increased uptake within SUV max of 2.67. This is compared with background liver activity of 2.15. No abnormal uptake within the pancreas, spleen or adrenal glands. Soft tissue mass involving the bladder measures 9 cm within SUV max of 15.7. Hypermetabolic right retrocrural lymph node measures 1 cm and has an SUV max of 7.13. Multiple hypermetabolic retroperitoneal and bilateral iliac and inguinal lymph nodes identified. Index left periaortic node measures 1.1 cm within SUV max of 8.01. Index retrocaval lymph node measures 1.3 cm and has an SUV max of 6.25. Left common iliac node measures 1.3 cm and has an SUV max of 1.2. Right common iliac node measures 1.5 cm and has an SUV max of 6.4. Left external iliac node measures 1.8 cm with SUV max of 8.79. Left inguinal node measures 0.7 cm and has an SUV max of 4.87. Left pericolic gutter soft tissue mass measures 12.4 cm and has SUV max of 11.9. Right lateral abdominal mass measures 8 cm and has SUV max of 12.07. Large mass within the ventral abdomen measures 16.6 cm within SUV max of 11.36. Small FDG avid peritoneal implants are identified along the surface of the liver. Incidental CT findings: Right-sided percutaneous nephrostomy tube is in place. No hydronephrosis identified bilaterally. Aortic atherosclerosis. SKELETON: No focal hypermetabolic activity to suggest skeletal metastasis. Incidental CT findings: none IMPRESSION: 1. Large mass within the urinary bladder is identified and is hypermetabolic concerning for primary  urothelial neoplasm. 2. Hypermetabolic left cervical, thoracic, abdominal, and pelvic lymph nodes consistent with metastatic adenopathy. 3. Extensive, bulky, hypermetabolic peritoneal disease within the abdomen and pelvis. 4. Bilateral pulmonary nodules concerning for metastatic disease. 5. A few small foci (without CT correlate) of mild FDG uptake are noted in the liver which are equivocal for metastasis. 6. No findings to suggest osseous metastasis. 7. Aortic Atherosclerosis (ICD10-I70.0) and Emphysema (ICD10-J43.9). Electronically Signed   By: Kerby Moors M.D.   On: 07/25/2019 13:30   Dg Chest Portable 1 View  Result Date: 08/07/2019 CLINICAL DATA:  Elevated white count, emesis for 3 days. EXAM: PORTABLE CHEST 1 VIEW COMPARISON:  Chest radiograph dated 07/21/2019 FINDINGS: The heart size and mediastinal contours are within normal limits. Emphysematous changes are noted. Both lungs are clear. The visualized skeletal structures are unremarkable. A left subclavian approach central venous port catheter tip overlies the superior vena cava. A pigtail catheter overlies the right upper quadrant. IMPRESSION: No acute cardiopulmonary findings. Emphysema (ICD10-J43.9). Electronically Signed   By: Zerita Boers M.D.   On: 08/07/2019 15:17   Dg Chest Port 1 View  Result Date: 07/21/2019 CLINICAL DATA:  Port placement.  Metastatic bladder cancer. EXAM: PORTABLE CHEST 1 VIEW COMPARISON:  Chest x-ray dated Feb 24, 2014. FINDINGS: New left chest wall port catheter with tip in the mid SVC. The heart size and mediastinal contours are within normal limits. Chronically coarsened interstitial markings with emphysematous changes. Small area of nodularity at the left lung base. No focal consolidation, pleural effusion, or pneumothorax. No acute osseous abnormality. IMPRESSION: 1. New appropriately positioned left chest wall port catheter without complicating feature. 2. Small area of nodularity at the left lung base.  Metastatic disease is not excluded. Electronically Signed   By: Titus Dubin M.D.   On: 07/21/2019 19:39   Dg Abd Portable 1 View  Result Date: 08/07/2019 CLINICAL DATA:  NG tube placement EXAM: PORTABLE ABDOMEN - 1 VIEW COMPARISON:  CT abdomen/pelvis 08/07/2019 FINDINGS: Nasogastric tube with the tip projecting over the stomach. There is ill-defined dilatation of the small bowel consistent with bowel obstruction. There is no evidence of pneumoperitoneum, portal venous gas or pneumatosis. There are no pathologic calcifications along the expected course of the ureters. Right percutaneous nephrostomy tube. The osseous structures are unremarkable. IMPRESSION: 1. Nasogastric tube with the tip projecting over the stomach. Electronically Signed   By: Kathreen Devoid   On: 08/07/2019 17:39   Dg C-arm 1-60 Min-no Report  Result Date: 07/21/2019 Fluoroscopy was utilized by the requesting physician.  No radiographic interpretation.    Assessment and plan-  68 year old male metastatic bladder cancer, not yet started on chemotherapy, current admitted due to SBO.  #Small bowel obstruction in the setting of widely metastatic bladder cancer with peritoneal carcinomatosis, no intervention per surgery given peritoneal carcinomatosis. Continue supportive care. Tolerating clear liquids. Continue octreotide 0.1mg  subQ Q12h, Reglan 10mg  Q8 hours, taper Dexamethasone to 4mg  BID.  He is going to try soft diet this afternoon.   #Medi port has been discontinued.  #Severe protein calorie malnutrition, recommend boost nutritional supplementation. # Goal of care, I discussed with him about poor prognosis. His nutrition status has decreased over time and he is getting weaker.  We discussed that the chance of him getting strong for chemotherapy is smaller each day goes by.  He understand. I discussed about point-of-care and encourage patient consider comfort/hospice care. Called patient's daughter [name] and updated about.   [name] is going to discuss with patient and update [place].  Leukocytosis likely due to steroid use.  Thrombocytopenia, likely due to consumption.  Increased immature platelet fraction.  Anticipate to recover in the next few days. CODE STATUS, DNR/DNI.   Earlie Server, MD, PhD Hematology Oncology [facility] Pager- [phone] 08/15/2019

## 2019-08-15 NOTE — Care Management Important Message (Signed)
Important Message  Patient Details  Name: Johnny Martin MRN: [id number] Date of Birth: 01-10-1951   Medicare Important Message Given:  Yes     Juliann Pulse A Jakeira Seeman 08/15/2019, 1:07 PM

## 2019-08-15 NOTE — Progress Notes (Signed)
PROGRESS NOTE  Johnny Martin [id number] DOB: [birth year] DOA: 08/07/2019 PCP: Earlie Server, MD  Brief History   68 year old man PMH metastatic bladder cancer presented with nausea and vomiting.  Admitted for small bowel obstruction, seen by general surgery, because of peritoneal carcinomatosis, palliative oncology consultations were obtained.  He was treated with octreotide and steroids.  He developed an open area over his Port-A-Cath site.  Port was removed by surgery.  A & P  Small bowel obstruction secondary metastatic bladder cancer, peritoneal carcinomatosis.  Not a surgical candidate secondary to extensive metastatic burden in abdomen.  Followed by general surgery.  Treated with octreotide and metoclopramide. --Overall slowly improving.  Surgery plans to advance diet and monitor.  Still no bowel movement but passing gas. --Continue octreotide, Reglan and dexamethasone per oncology --Oncology discussed with patient and daughter about consideration for comfort and hospice.  Scrotal edema, +2.7 L since admission --Improving with Lasix.  Next dose tomorrow.  Thrombocytopenia --Slightly lower.  No evidence of infection.  Patient has not had any chemotherapy.  Follow.  Check CBC in a.m.  Status post port removal for skin erosion. --Local wound care.  Metastatic bladder cancer.  Prognosis is poor.  Palliative chemotherapy plus/minus immunotherapy delayed secondary to small bowel obstruction.  Status post nephrostomy tube on the right. --Overall prognosis poor.  Oncology has recommended consideration of hospice or comfort care.  Acute kidney injury --Resolved.  Elevated TSH --Follow-up as an outpatient if clinically indicated  Severe malnutrition in the context of chronic illness --Continue boost or Ensure  Aortic atherosclerosis  Emphysema  Sepsis secondary to UTI considered on admission.  However this was ruled out with negative cultures.  Blood cultures no growth.  Urine  culture with multiple species.  Antibiotics stopped.  DVT prophylaxis: SCDs Code Status: DNR Family Communication: Disposition Plan: home with home health PT versus hospice   Murray Hodgkins, MD  [facility] Direct contact: see [email or web address] (further directions at bottom of note if needed) 7PM-7AM contact night coverage as at bottom of note 08/15/2019, 5:14 PM  LOS: 8 days   Significant Hospital Events   . 10/25 admitted for small bowel obstruction.  General surgery consultation . 10/26 oncology consultation . 10/27 palliative medicine consultation   Consults:  . General surgery . Oncology . Palliative medicine   Procedures:  . 11/1 bedside removal of Port-A-Cath  Significant Diagnostic Tests:  . SARS Covid negative . CT abdomen pelvis: Bulky metastatic disease involving the omentum, multiple loops of small bowel, anterior abdominal wall.  Stomach and proximal to mid small bowel diffusely distended with transition point noted consistent with small bowel obstruction.   Micro Data:  . Blood cultures no growth, final . Urine culture multiple species present   Antimicrobials:  .   Interval History/Subjective  Overall okay.  Tolerating liquids.  Scrotal edema has decreased.  Objective   Vitals:  Vitals:   08/15/19 0855 08/15/19 1450  BP: (!) 145/86 (!) 154/103  Pulse: 95 97  Resp: 20 14  Temp: 98.4 F (36.9 C) 98.7 F (37.1 C)  SpO2: 98% 97%    Exam:  Constitutional:   . Appears calm and comfortable Respiratory:  . CTA bilaterally, no w/r/r.  . Respiratory effort normal.  Cardiovascular:  . RRR, no m/r/g . No LE extremity edema   Abdomen:  . Soft upper, firm lower . GU: Decreasing scrotal and penile edema Psychiatric:  . Mental status o Mood, affect appropriate  I have personally reviewed the following:  Today's Data  . CBC unremarkable except for platelets of 61.  Scheduled Meds: . Chlorhexidine Gluconate Cloth  6 each Topical Daily  .  dexamethasone  4 mg Intravenous Q12H  . docusate sodium  100 mg Oral BID  . feeding supplement  1 Container Oral TID BM  . lidocaine-EPINEPHrine  10 mL Intradermal Once  . metoCLOPramide (REGLAN) injection  10 mg Intravenous Q8H  . metoprolol tartrate  25 mg Oral BID  . nicotine  21 mg Transdermal Daily  . octreotide  100 mcg Subcutaneous Q12H  . pantoprazole  40 mg Oral Daily  . senna-docusate  2 tablet Oral Daily   Continuous Infusions: . 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  1. Check the care team in [facility] and look for a) attending/consulting [facility] provider listed and b) the [facility] team listed 2. Log into [email or web address] and use [facility]'s universal password to access. If you do not have the password, please contact the hospital operator. 3. Locate the [facility] provider you are looking for under [facility] and page to a number that you can be directly reached. 4. If you still have difficulty reaching the provider, please page the [facility] (Director on Call) for the Hospitalists listed on amion for assistance.

## 2019-08-15 NOTE — Consult Note (Signed)
[place] Nurse wound consult note Patient receiving care in [facility]. Reason for Consult: Left upper chest wound Wound type: Patient had port removed from site yesterday 08/15/19 at the bedside. Measurement: 1.3 cm x 2.4 cm x 0.9 cm Wound bed: pink Drainage (amount, consistency, odor) serosanginous on existing gauze packing Periwound: intact Dressing procedure/placement/frequency: Twice daily saline moistened gauze, cover with dry gauze. Monitor the wound area(s) for worsening of condition such as: Signs/symptoms of infection,  Increase in size,  Development of or worsening of odor, Development of pain, or increased pain at the affected locations.  Notify the medical team if any of these develop.  Thank you for the consult.  Discussed plan of care with the patient.  [place] nurse will not follow at this time.  Please re-consult the [place] team if needed.  Val Riles, RN, MSN, CWOCN, CNS-BC, pager [phone]

## 2019-08-15 NOTE — Progress Notes (Signed)
Nutrition Follow-up  DOCUMENTATION CODES:   Severe malnutrition in context of chronic illness  INTERVENTION:  Continue Boost Breeze po TID as ordered by surgery, each supplement provides 250 kcal and 9 grams of protein.  Patient reports he may prefer a chocolate oral nutrition supplement. If this is the case recommend chocolate Ensure Enlive po BID, each supplement provides 350 kcal and 20 grams of protein.  NUTRITION DIAGNOSIS:   Severe Malnutrition related to chronic illness(metastatic bladder cancer) as evidenced by severe fat depletion, severe muscle depletion.  Ongoing.  GOAL:   Patient will meet greater than or equal to 90% of their needs  Progressing.  MONITOR:   Diet advancement, Labs, Weight trends, Skin, I & O's  REASON FOR ASSESSMENT:   Malnutrition Screening Tool    ASSESSMENT:   68 year old male with PMHx of metastatic bladder cancer and peritoneal carcinomatosis admitted with SBO, sepsis secondary to UTI.  Patient reports his appetite is improving. He has been finishing 100% of his liquid trays. Diet was advanced to soft today. Patient reports he ordered a tuna salad sandwich to lunch. Patient was ordered for Boost Breeze to start today. He reports he may prefer a chocolate protein shake. He is passing flatus and had a bowel movement on 10/31.  Medications reviewed and include: Decadron 4 mg Q12hrs IV, Colace 100 mg BID, Reglan 10 mg Q8hrs IV, nicotine patch, octreotide 100 mcg Q12hrs IV, pantoprazole, senna-docusate 2 tablets daily.  Labs reviewed: Sodium 134.  Diet Order:   Diet Order            DIET SOFT Room service appropriate? Yes; Fluid consistency: Thin  Diet effective now             EDUCATION NEEDS:   No education needs have been identified at this time  Skin:  Skin Assessment: Skin Integrity Issues:(closed incision to chest)  Last BM:  08/13/2019 - large type 6  Height:   Ht Readings from Last 1 Encounters:  08/02/19 6\' 1"  (1.854  m)   Weight:   Wt Readings from Last 1 Encounters:  08/14/19 72.4 kg   Ideal Body Weight:  83.6 kg  BMI:  Body mass index is 21.06 kg/m.  Estimated Nutritional Needs:   Kcal:  2000-2200  Protein:  100-110 grams  Fluid:  2-2.2 L/day  Jacklynn Barnacle, MS, RD, LDN Office: [phone] Pager: [phone] After Hours/Weekend Pager: [phone]

## 2019-08-15 NOTE — Progress Notes (Signed)
[place] Hospital Day(s): 8.   Interval History:  Patient seen and examined no acute events or new complaints overnight. Patient reports he continues to do well. No abdominal pain, distension, nausea, or emesis On full liquid diet, tolerating, still with flatus and BMs Port-a-catheter removed from left chest wall secondary to dehiscence of wound yesterday   Vital signs in last 24 hours: [min-max] current  Temp:  [97.2 F (36.2 C)-97.8 F (36.6 C)] 97.2 F (36.2 C) (11/02 0458) Pulse Rate:  [85-107] 85 (11/02 0458) Resp:  [17-18] 17 (11/02 0458) BP: (113-150)/(77-98) 113/77 (11/02 0458) SpO2:  [95 %-100 %] 95 % (11/02 0458)       Weight: 72.4 kg BMI (Calculated): 21.06   Intake/Output last 2 shifts:  11/01 0701 - 11/02 0700 In: 120 [P.O.:120] Out: 975 [Urine:975]   Physical Exam:  Constitutional: alert, cooperative and no distress  HENT: normocephalic without obvious abnormality Eyes: PERRL, EOM's grossly intact and symmetric  Respiratory: breathing non-labored at rest Chest:Port in left upper chest, incision healing well with dermabond Cardiovascular: regular rate and sinus rhythm  Gastrointestinal: soft, non-tender,still with mild distension. There is a palpable firm mass in the supraumbilical region. No rebound/guarding Genitourinary: Nephrostomy tube in place, making good urine  Labs:  CBC Latest Ref Rng & Units 08/15/2019 08/14/2019 08/13/2019  WBC 4.0 - 10.5 K/uL 10.4 11.4(H) 12.0(H)  Hemoglobin 13.0 - 17.0 g/dL 13.6 13.7 13.0  Hematocrit 39.0 - 52.0 % 40.9 40.9 39.7  Platelets 150 - 400 K/uL 61(L) 74(L) 107(L)   CMP Latest Ref Rng & Units 08/14/2019 08/13/2019 08/12/2019  Glucose 70 - 99 mg/dL 115(H) 146(H) 132(H)  BUN 8 - 23 mg/dL 23 22 26(H)  Creatinine 0.61 - 1.24 mg/dL 0.72 0.66 0.81  Sodium 135 - 145 mmol/L 134(L) 138 143  Potassium 3.5 - 5.1 mmol/L 3.8 3.8 4.1  Chloride 98 - 111 mmol/L 104 109 112(H)  CO2 22 -  32 mmol/L 22 23 16(L)  Calcium 8.9 - 10.3 mg/dL 8.2(L) 8.3(L) 8.5(L)  Total Protein 6.5 - 8.1 g/dL - - -  Total Bilirubin 0.3 - 1.2 mg/dL - - -  Alkaline Phos 38 - 126 U/L - - -  AST 15 - 41 U/L - - -  ALT 0 - 44 U/L - - -     Imaging studies: No new pertinent imaging studies   Assessment/Plan:  68 y.o. male with  clinically improving/resolvedsmall bowel obstruction related to peritoneal carcinomatosis, complicated by pertinent comorbidities includingmetastatic bladder CA.   - Advance to soft diet  - Agree discontinue IVF  - Serial abdominal examination +/- KUBs - Appreciate oncology and palliative consults - Again, this is a very difficult situation and surgical intervention has low yield given likelihood of peritoneal carcinomatosis and possible frozen abdomen. Will continue conservative measures described above - pain control prn - mobilization encouraged - Further management per primary service  All of the above findings and recommendations were discussed with the patient, and the medical team, and all of patient's questions were answered to expressed satisfaction.  -- Edison Simon, PA-C [facility] 08/15/2019, 7:41 AM [phone] M-F: 7am - 4pm

## 2019-08-15 NOTE — Progress Notes (Signed)
Pt sitting up in bed eating supper with no acute distress.

## 2019-08-15 NOTE — Progress Notes (Signed)
Physical Therapy Treatment Patient Details Name: Johnny Martin MRN: [id number] DOB: 06-Oct-1951 Today's Date: 08/15/2019    History of Present Illness Pt is a 68 y/o male admitted for SBO on 10/25. Pt reported emesis for 3 days prior to admission. PMH includes metastatic bladder CA, which is expected to be the cause of the SBO.    PT Comments    Pt is making limited progress towards goals with ability to ambulate hallway distances with use of RW. Fatigues quickly, however appears motivated to participate. Will continue to progress.  Follow Up Recommendations  Home health PT     Equipment Recommendations  Rolling walker with 5" wheels    Recommendations for Other Services       Precautions / Restrictions Precautions Precautions: Fall Restrictions Weight Bearing Restrictions: No    Mobility  Bed Mobility Overal bed mobility: Needs Assistance Bed Mobility: Sit to Supine       Sit to supine: Supervision   General bed mobility comments: safe technique  Transfers Overall transfer level: Needs assistance Equipment used: Rolling walker (2 wheeled) Transfers: Sit to/from Stand Sit to Stand: Min assist         General transfer comment: assist for ant translation  Ambulation/Gait Ambulation/Gait assistance: Min guard Gait Distance (Feet): 120 Feet Assistive device: Rolling walker (2 wheeled) Gait Pattern/deviations: Step-through pattern;Wide base of support;Trunk flexed     General Gait Details: ambulation performed in hallway using RW. Forward flexed posture. Fatigues quickly. Slow speed.   Stairs             Wheelchair Mobility    Modified Rankin (Stroke Patients Only)       Balance Overall balance assessment: Needs assistance Sitting-balance support: Feet supported Sitting balance-Leahy Scale: Good     Standing balance support: Bilateral upper extremity supported Standing balance-Leahy Scale: Fair                               Cognition Arousal/Alertness: Awake/alert Behavior During Therapy: WFL for tasks assessed/performed Overall Cognitive Status: Within Functional Limits for tasks assessed                                        Exercises Other Exercises Other Exercises: needs min assist for assistance with hygiene.     General Comments        Pertinent Vitals/Pain Pain Assessment: No/denies pain    Home Living                      Prior Function            PT Goals (current goals can now be found in the care plan section) Acute Rehab PT Goals Patient Stated Goal: to get stronger PT Goal Formulation: With patient Time For Goal Achievement: 08/26/19 Potential to Achieve Goals: Good Progress towards PT goals: Progressing toward goals    Frequency    Min 2X/week      PT Plan Discharge plan needs to be updated    Co-evaluation              AM-PAC PT "6 Clicks" Mobility   Outcome Measure  Help needed turning from your back to your side while in a flat bed without using bedrails?: None Help needed moving from lying on your back to sitting on the side of a  flat bed without using bedrails?: None Help needed moving to and from a bed to a chair (including a wheelchair)?: A Little Help needed standing up from a chair using your arms (e.g., wheelchair or bedside chair)?: A Little Help needed to walk in hospital room?: A Little Help needed climbing 3-5 steps with a railing? : A Lot 6 Click Score: 19    End of Session Equipment Utilized During Treatment: Gait belt Activity Tolerance: Patient tolerated treatment well Patient left: in bed;with bed alarm set Nurse Communication: Mobility status PT Visit Diagnosis: Unsteadiness on feet (R26.81);Muscle weakness (generalized) (M62.81)     Time: [id number] PT Time Calculation (min) (ACUTE ONLY): 11 min  Charges:  $Gait Training: 8-22 mins                     Greggory Stallion, [place],  DPT [phone]    Johnny Martin 08/15/2019, 5:02 PM

## 2019-08-16 ENCOUNTER — Inpatient Hospital Stay: Payer: Medicare Other

## 2019-08-16 DIAGNOSIS — R140 Abdominal distension (gaseous): Secondary | ICD-10-CM

## 2019-08-16 DIAGNOSIS — C678 Malignant neoplasm of overlapping sites of bladder: Secondary | ICD-10-CM

## 2019-08-16 DIAGNOSIS — Z7189 Other specified counseling: Secondary | ICD-10-CM

## 2019-08-16 MED ORDER — FUROSEMIDE 10 MG/ML IJ SOLN
20.0000 mg | Freq: Four times a day (QID) | INTRAMUSCULAR | Status: AC
  Administered 2019-08-16 (×2): 20 mg via INTRAVENOUS
  Filled 2019-08-16 (×2): qty 2

## 2019-08-16 MED ORDER — POLYETHYLENE GLYCOL 3350 17 G PO PACK
17.0000 g | PACK | Freq: Every day | ORAL | Status: DC
  Administered 2019-08-16 – 2019-08-18 (×3): 17 g via ORAL
  Filled 2019-08-16 (×3): qty 1

## 2019-08-16 NOTE — Progress Notes (Signed)
Hematology/Oncology Progress Note [facility] [phone] [phone]  Patient Care Team: Earlie Server, MD as PCP - General (Oncology)   Name of the patient: Johnny Martin  [id number]  [birth year]  Date of visit: 08/16/19   INTERVAL HISTORY-  Patient was seen at the bedside.   Patient denies any nausea or vomiting today. He has been on soft diet since yesterday.  Has not had any bowel movement. Some abdomen discomfort earlier. Continue to pass flatus, no bowel movement for 3 days .   Review of systems-  Review of Systems  Constitutional: Positive for appetite change, fatigue and unexpected weight change. Negative for chills and fever.  HENT:   Negative for hearing loss and voice change.   Eyes: Negative for eye problems and icterus.  Respiratory: Negative for chest tightness, cough, hemoptysis and shortness of breath.   Cardiovascular: Negative for chest pain and leg swelling.  Gastrointestinal: Positive for abdominal distention. Negative for abdominal pain.  Endocrine: Negative for hot flashes.  Genitourinary: Negative for difficulty urinating, dysuria and frequency.   Musculoskeletal: Negative for arthralgias.  Skin: Negative for itching and rash.  Neurological: Negative for light-headedness and numbness.  Hematological: Negative for adenopathy. Does not bruise/bleed easily.  Psychiatric/Behavioral: Negative for confusion. The patient is not nervous/anxious.      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[place])    Protein-calorie malnutrition, severe 08/08/2019   SBO (small bowel obstruction) ([place]) 08/07/2019   Goals of care, counseling/discussion 07/21/2019   Bladder cancer ([place]) 07/21/2019   Drug-induced constipation    Bladder mass 07/11/2019   Hydronephrosis due to obstruction  of ureter 07/11/2019   Gross hematuria    Peritoneal carcinomatosis ([facility])    Mass of left side of neck    AKI (acute kidney injury) ([place])      Past Medical History:  Diagnosis Date   Bladder cancer ([place]) 07/21/2019   Cancer ([place])    Bladder cancer     Past Surgical History:  Procedure Laterality Date   BLADDER SURGERY     BRAIN SURGERY     plate in head per patient since childhood    PORTACATH PLACEMENT Left 07/21/2019   Procedure: INSERTION PORT-A-CATH;  Surgeon: Olean Ree, MD;  Location: [facility] ORS;  Service: General;  Laterality: Left;    Social History   Socioeconomic History   Marital status: Divorced    Spouse name: Not on file   Number of children: Not on file   Years of education: Not on file   Highest education level: Not on file  Occupational History   Not on file  Social Needs   Financial resource strain: Not on file   Food insecurity    Worry: Not on file    Inability: Not on file   Transportation needs    Medical: Not on file    Non-medical: Not on file  Tobacco Use   Smoking status: Former Smoker    Packs/day: 0.50    Quit date: 07/13/2019    Years since quitting: 0.0   Smokeless tobacco: Never Used  Substance and Sexual Activity   Alcohol use: Not Currently    Frequency: Never    Comment: quit 3 years ago    Drug use: Never   Sexual activity: Not on file  Lifestyle   Physical activity  Days per week: Not on file    Minutes per session: Not on file   Stress: Not on file  Relationships   Social connections    Talks on phone: Not on file    Gets together: Not on file    Attends religious service: Not on file    Active member of club or organization: Not on file    Attends meetings of clubs or organizations: Not on file    Relationship status: Not on file   Intimate partner violence    Fear of current or ex partner: Not on file    Emotionally abused: Not on file    Physically abused: Not on file    Forced  sexual activity: Not on file  Other Topics Concern   Not on file  Social History Narrative   Lives at home with daughter and son in [place].     Family History  Problem Relation Age of Onset   Cervical cancer Sister    Kidney failure Mother    Stroke Mother    Alzheimer's disease Father      Current Facility-Administered Medications:    0.9 %  sodium chloride infusion, , Intravenous, Continuous, Monia Sabal, PA-C   acetaminophen (TYLENOL) tablet 650 mg, 650 mg, Oral, Q6H PRN **OR** acetaminophen (TYLENOL) suppository 650 mg, 650 mg, Rectal, Q6H PRN, Harrie Foreman, MD   Chlorhexidine Gluconate Cloth 2 % PADS 6 each, 6 each, Topical, Daily, Harrie Foreman, MD, 6 each at 08/16/19 0925   dexamethasone (DECADRON) injection 4 mg, 4 mg, Intravenous, Q12H, Earlie Server, MD, 4 mg at 08/16/19 [id number]   docusate sodium (COLACE) capsule 100 mg, 100 mg, Oral, BID, Harrie Foreman, MD, 100 mg at 08/16/19 [id number]   feeding supplement (BOOST / RESOURCE BREEZE) liquid 1 Container, 1 Container, Oral, TID BM, Tylene Fantasia, PA-C, 1 Container at 08/16/19 [id number]   furosemide (LASIX) injection 20 mg, 20 mg, Intravenous, Q6H, Samuella Cota, MD   HYDROmorphone (DILAUDID) injection 0.5 mg, 0.5 mg, Intravenous, Q3H PRN, Mayo, Pete Pelt, MD, 0.5 mg at 08/16/19 [id number]   lidocaine-EPINEPHrine (XYLOCAINE W/EPI) 1 %-1:100000 (with pres) injection 10 mL, 10 mL, Intradermal, Once, Mayo, Pete Pelt, MD   metoCLOPramide (REGLAN) injection 10 mg, 10 mg, Intravenous, Q8H, Earlie Server, MD, 10 mg at 08/16/19 0504   metoprolol tartrate (LOPRESSOR) tablet 25 mg, 25 mg, Oral, BID, Mayo, Pete Pelt, MD, 25 mg at 08/16/19 [id number]   nicotine (NICODERM CQ - dosed in mg/24 hours) patch 21 mg, 21 mg, Transdermal, Daily, Harrie Foreman, MD, 21 mg at 08/15/19 2142   octreotide (SANDOSTATIN) injection 100 mcg, 100 mcg, Subcutaneous, Q12H, Piscoya, Jose, MD, 100 mcg at 08/15/19 2150   ondansetron (ZOFRAN) tablet 4  mg, 4 mg, Oral, Q6H PRN **OR** ondansetron (ZOFRAN) injection 4 mg, 4 mg, Intravenous, Q6H PRN, Harrie Foreman, MD, 4 mg at 08/13/19 0353   pantoprazole (PROTONIX) EC tablet 40 mg, 40 mg, Oral, Daily, Mayo, Pete Pelt, MD, 40 mg at 08/16/19 0924   polyethylene glycol (MIRALAX / GLYCOLAX) packet 17 g, 17 g, Oral, Daily, Tylene Fantasia, PA-C, 17 g at 08/16/19 [id number]   prochlorperazine (COMPAZINE) injection 10 mg, 10 mg, Intravenous, Q6H PRN, Harrie Foreman, MD   senna-docusate (Senokot-S) tablet 2 tablet, 2 tablet, Oral, Daily, Harrie Foreman, MD, 2 tablet at 08/16/19 0925   Physical exam:  Vitals:   08/15/19 1450 08/15/19 1941 08/16/19 0430 08/16/19 0917  BP: (!) 154/103 123/84 136/82 Marland Kitchen)  153/82  Pulse: 97 88 84 91  Resp: 14 17 18 18   Temp: 98.7 F (37.1 C) 98.3 F (36.8 C) 98.6 F (37 C) 98 F (36.7 C)  TempSrc:    Oral  SpO2: 97% 97% 97% 97%  Weight:       Physical Exam  Constitutional: He is oriented to person, place, and time. No distress.  Cachectic  HENT:  Head: Normocephalic and atraumatic.  Nose: Nose normal.  Mouth/Throat: Oropharynx is clear and moist. No oropharyngeal exudate.  Eyes: Pupils are equal, round, and reactive to light. EOM are normal. No scleral icterus.  Neck: Normal range of motion. Neck supple.  Cardiovascular: Normal rate and regular rhythm.  No murmur heard. Pulmonary/Chest: Effort normal. No respiratory distress. He has no rales. He exhibits no tenderness.  Abdominal: Soft. He exhibits distension. There is no abdominal tenderness.  Sluggish bowel sounds  Nephrostomy tube  Musculoskeletal: Normal range of motion.        General: No edema.  Neurological: He is alert and oriented to person, place, and time. No cranial nerve deficit. He exhibits normal muscle tone. Coordination normal.  Skin: Skin is warm and dry. He is not diaphoretic. No erythema.  Psychiatric: Affect normal.      CMP Latest Ref Rng & Units 08/14/2019  Glucose  70 - 99 mg/dL 115(H)  BUN 8 - 23 mg/dL 23  Creatinine 0.61 - 1.24 mg/dL 0.72  Sodium 135 - 145 mmol/L 134(L)  Potassium 3.5 - 5.1 mmol/L 3.8  Chloride 98 - 111 mmol/L 104  CO2 22 - 32 mmol/L 22  Calcium 8.9 - 10.3 mg/dL 8.2(L)  Total Protein 6.5 - 8.1 g/dL -  Total Bilirubin 0.3 - 1.2 mg/dL -  Alkaline Phos 38 - 126 U/L -  AST 15 - 41 U/L -  ALT 0 - 44 U/L -   CBC Latest Ref Rng & Units 08/15/2019  WBC 4.0 - 10.5 K/uL 10.4  Hemoglobin 13.0 - 17.0 g/dL 13.6  Hematocrit 39.0 - 52.0 % 40.9  Platelets 150 - 400 K/uL 61(L)    RADIOGRAPHIC STUDIES: I have personally reviewed the radiological images as listed and agreed with the findings in the report. Dg Abd 1 View  Result Date: 08/10/2019 CLINICAL DATA:  68 year old male with a history of small bowel obstruction EXAM: ABDOMEN - 1 VIEW COMPARISON:  08/09/2019 FINDINGS: Similar appearance of the lower chest. Gastric tube terminates within the stomach. Unchanged appearance of the right percutaneous nephrostomy. Dilated small bowel loops within the mid abdomen. Paucity of colonic and rectal gas. Small formed stool within the rectum. No unexpected radiopaque foreign body. No displaced fracture IMPRESSION: Dilated small bowel loops indicating ongoing obstruction/ileus. Relative absence of colonic gas. Unchanged gastric tube and right percutaneous nephrostomy. Electronically Signed   By: Corrie Mckusick D.O.   On: 08/10/2019 08:09   Dg Abd 1 View  Result Date: 08/09/2019 CLINICAL DATA:  Small bowel obstruction EXAM: ABDOMEN - 1 VIEW COMPARISON:  08/07/2019 FINDINGS: NG tube tip is within the fundus of the stomach, unchanged. Right side nephrostomy catheter again noted, unchanged. There are dilated small bowel loops compatible with small bowel obstruction, slightly progressed since prior study. No organomegaly or free air. IMPRESSION: Small-bowel dilatation slightly progressed since prior study. Findings compatible with continued small bowel  obstruction. Electronically Signed   By: Rolm Baptise M.D.   On: 08/09/2019 09:42   Ct Abdomen Pelvis W Contrast  Result Date: 08/07/2019 CLINICAL DATA:  Emesis for 2 days, history  of bladder cancer EXAM: CT ABDOMEN AND PELVIS WITH CONTRAST TECHNIQUE: Multidetector CT imaging of the abdomen and pelvis was performed using the standard protocol following bolus administration of intravenous contrast. CONTRAST:  139mL OMNIPAQUE IOHEXOL 300 MG/ML  SOLN COMPARISON:  PET-CT, 07/25/2019 FINDINGS: Lower chest: No acute abnormality. Metastatic nodules in the partially included lingula (series 4, image 9). Emphysema. Hepatobiliary: No solid liver abnormality is seen. No gallstones, gallbladder wall thickening, or biliary dilatation. Pancreas: Unremarkable. No pancreatic ductal dilatation or surrounding inflammatory changes. Spleen: Normal in size without significant abnormality. Adrenals/Urinary Tract: Adrenal glands are unremarkable. Right-sided percutaneous nephrostomy with formed pigtail in the right renal pelvis. No hydronephrosis. Redemonstrated, bulky, predominantly endoluminal bladder mass and soft tissue nodules. Stomach/Bowel: There is redemonstrated, extensive, bulky, metastatic tissue involving the omentum, multiple loops of small bowel, and the anterior abdominal wall. The stomach and proximal to mid small bowel are diffusely fluid distended, with an abrupt transition point in the central abdomen abutting metastatic soft tissue (series 2, image 59) and decompression of the distal small bowel. Vascular/Lymphatic: Aortic atherosclerosis. Multiple enlarged retroperitoneal, iliac, and pelvic sidewall lymph nodes as seen on prior examination. Reproductive: No mass or other significant abnormality. Other: No abdominal wall hernia or abnormality. No abdominopelvic ascites. Musculoskeletal: No acute or significant osseous findings. IMPRESSION: 1. There is redemonstrated, extensive, bulky, metastatic tissue involving  the omentum, multiple loops of small bowel, and the anterior abdominal wall, not significant changed in appearance compared to prior PET-CT examination dated 07/25/2019. The stomach and proximal to mid small bowel are diffusely fluid distended, with an abrupt transition point in the central abdomen abutting metastatic soft tissue (series 2, image 59) and decompression of the distal small bowel. Findings are consistent with small bowel obstruction. 2. Other findings of primary bladder malignancy and advanced metastatic disease as detailed above, not significantly changed in comparison to PET-CT dated 07/25/2019. 3. Aortic Atherosclerosis (ICD10-I70.0) and Emphysema (ICD10-J43.9). Electronically Signed   By: Eddie Candle M.D.   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 EXAM: NUCLEAR MEDICINE PET SKULL BASE TO THIGH TECHNIQUE: 8.795 mCi F-18 FDG was injected intravenously. Full-ring PET imaging was performed from the skull base to thigh after the radiotracer. CT data was obtained and used for attenuation correction and anatomic localization. Fasting blood glucose: 93 mg/dl COMPARISON:  None. FINDINGS: Mediastinal blood pool activity: SUV max 1.36 . Liver activity: SUV max NA NECK: Enlarged and hypermetabolic left level 2 lymph nodes. Nodal mass measures 3.4 by 1.9 cm and has an SUV max of 7.06 Incidental CT findings: none CHEST: Hypermetabolic right paratracheal, subcarinal and bilateral hilar lymph nodes are identified. Right paratracheal node measures 1.4 cm and has an SUV max of 10.38. Index subcarinal node measures 1.1 cm and has an SUV max of 8.93. Index left hilar node measures 1.2 cm and has an SUV max of 6.41. Right hilar lymph node measures 1.2 cm and has an SUV max 8.66. Right CP angle lymph node is FDG avid measuring 1.4 cm with SUV max of 7.57. Bilateral FDG avid pulmonary  nodules. Right lower lobe lung nodule measures 1 cm and has an SUV max of 1.88. Hypermetabolic subpleural nodule in the lingula measures 1 cm and has an SUV max of 6.6. Incidental CT findings: Advanced changes of emphysema. Aortic atherosclerosis. ABDOMEN/PELVIS: There is a few small foci of FDG uptake above liver background activity  without definite CT correlate, equivocal for metastasis. For example, within segment 8 there is an area of increased uptake within SUV max of 2.67. This is compared with background liver activity of 2.15. No abnormal uptake within the pancreas, spleen or adrenal glands. Soft tissue mass involving the bladder measures 9 cm within SUV max of 15.7. Hypermetabolic right retrocrural lymph node measures 1 cm and has an SUV max of 7.13. Multiple hypermetabolic retroperitoneal and bilateral iliac and inguinal lymph nodes identified. Index left periaortic node measures 1.1 cm within SUV max of 8.01. Index retrocaval lymph node measures 1.3 cm and has an SUV max of 6.25. Left common iliac node measures 1.3 cm and has an SUV max of 1.2. Right common iliac node measures 1.5 cm and has an SUV max of 6.4. Left external iliac node measures 1.8 cm with SUV max of 8.79. Left inguinal node measures 0.7 cm and has an SUV max of 4.87. Left pericolic gutter soft tissue mass measures 12.4 cm and has SUV max of 11.9. Right lateral abdominal mass measures 8 cm and has SUV max of 12.07. Large mass within the ventral abdomen measures 16.6 cm within SUV max of 11.36. Small FDG avid peritoneal implants are identified along the surface of the liver. Incidental CT findings: Right-sided percutaneous nephrostomy tube is in place. No hydronephrosis identified bilaterally. Aortic atherosclerosis. SKELETON: No focal hypermetabolic activity to suggest skeletal metastasis. Incidental CT findings: none IMPRESSION: 1. Large mass within the urinary bladder is identified and is hypermetabolic concerning for primary urothelial  neoplasm. 2. Hypermetabolic left cervical, thoracic, abdominal, and pelvic lymph nodes consistent with metastatic adenopathy. 3. Extensive, bulky, hypermetabolic peritoneal disease within the abdomen and pelvis. 4. Bilateral pulmonary nodules concerning for metastatic disease. 5. A few small foci (without CT correlate) of mild FDG uptake are noted in the liver which are equivocal for metastasis. 6. No findings to suggest osseous metastasis. 7. Aortic Atherosclerosis (ICD10-I70.0) and Emphysema (ICD10-J43.9). Electronically Signed   By: Kerby Moors M.D.   On: 07/25/2019 13:30   Dg Chest Portable 1 View  Result Date: 08/07/2019 CLINICAL DATA:  Elevated white count, emesis for 3 days. EXAM: PORTABLE CHEST 1 VIEW COMPARISON:  Chest radiograph dated 07/21/2019 FINDINGS: The heart size and mediastinal contours are within normal limits. Emphysematous changes are noted. Both lungs are clear. The visualized skeletal structures are unremarkable. A left subclavian approach central venous port catheter tip overlies the superior vena cava. A pigtail catheter overlies the right upper quadrant. IMPRESSION: No acute cardiopulmonary findings. Emphysema (ICD10-J43.9). Electronically Signed   By: Zerita Boers M.D.   On: 08/07/2019 15:17   Dg Chest Port 1 View  Result Date: 07/21/2019 CLINICAL DATA:  Port placement.  Metastatic bladder cancer. EXAM: PORTABLE CHEST 1 VIEW COMPARISON:  Chest x-ray dated Feb 24, 2014. FINDINGS: New left chest wall port catheter with tip in the mid SVC. The heart size and mediastinal contours are within normal limits. Chronically coarsened interstitial markings with emphysematous changes. Small area of nodularity at the left lung base. No focal consolidation, pleural effusion, or pneumothorax. No acute osseous abnormality. IMPRESSION: 1. New appropriately positioned left chest wall port catheter without complicating feature. 2. Small area of nodularity at the left lung base. Metastatic disease  is not excluded. Electronically Signed   By: Titus Dubin M.D.   On: 07/21/2019 19:39   Dg Abd Portable 1 View  Result Date: 08/07/2019 CLINICAL DATA:  NG tube placement EXAM: PORTABLE ABDOMEN - 1 VIEW COMPARISON:  CT  abdomen/pelvis 08/07/2019 FINDINGS: Nasogastric tube with the tip projecting over the stomach. There is ill-defined dilatation of the small bowel consistent with bowel obstruction. There is no evidence of pneumoperitoneum, portal venous gas or pneumatosis. There are no pathologic calcifications along the expected course of the ureters. Right percutaneous nephrostomy tube. The osseous structures are unremarkable. IMPRESSION: 1. Nasogastric tube with the tip projecting over the stomach. Electronically Signed   By: Kathreen Devoid   On: 08/07/2019 17:39   Dg C-arm 1-60 Min-no Report  Result Date: 07/21/2019 Fluoroscopy was utilized by the requesting physician.  No radiographic interpretation.    Assessment and plan-  68 year old male metastatic bladder cancer, not yet started on chemotherapy, current admitted due to SBO.  #Small bowel obstruction in the setting of widely metastatic bladder cancer with peritoneal carcinomatosis, no intervention per surgery given peritoneal carcinomatosis. Continue supportive care. Continue octreotide 0.1mg  subQ Q12h, Reglan 10mg  Q8 hours, taper Dexamethasone to 4mg  BID.  Abdomen appears to be more distended. Discussed with surgery. Go back to liquid diet. Repeat KUB.  If KUB is worse, recommend 1 bolus dose of Gastrografin oral  contrast  #Goals of care discussion, had a lengthy discussion with patient's daughter [name] yesterday afternoon. Also discussed with patient today along with his other doctor at the bedside. Prognosis is poor.  He is getting weaker and more malnutrition.  Poor candidate for chemotherapy. Recommend continue supportive care, palliating small bowel obstruction symptoms and transition to hospice/comfort care. Patient agrees  with the plan.  Discussed with palliative care service Vonna Kotyk.  #Severe protein calorie malnutrition.  Continue boost nutrition supplementation.  Leukocytosis likely due to steroid use.  Thrombocytopenia, likely due to consumption.  Increased immature platelet fraction.  Anticipate to recover in the next few days. CODE STATUS, DNR/DNI.   Earlie Server, MD, PhD Hematology Oncology [facility] Pager- [phone] 08/16/2019

## 2019-08-16 NOTE — Progress Notes (Signed)
[place]  [phone] [phone]   Name: Johnny Martin Date: 08/16/2019 MRN: [id number]  DOB: 01-Jul-1951  Patient Care Team: Earlie Server, MD as PCP - General (Oncology)    REASON FOR CONSULTATION: Palliative Care consult requested for this68 y.o.malewith multiple medical problems including history of bladder cancer lost to follow-up, who was recently hospitalized 07/11/2019-07/14/2019 with hematuria.Work-up revealed right-sided hydronephrosis requiring a right nephrostomy tube. 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 Subsequent PET scan on 07/25/2019 shows metastatic disease to bladder, with widespread lymph involvement, bilateral pulmonary nodules, liver, and extensive hypermetabolic peritoneal disease. He also was noted to have a left neck mass concerning for malignancy.  Patient was admitted to the hospital on 08/07/2019 with intractable nausea and vomiting.  He was found to have a small bowel obstruction per abdominal CT.  Patient has not felt to be a surgical candidate and is being treated conservatively with gastric decompression via NGT.Patient was referred to palliative care to help address goals and manage ongoing symptoms.   CODE STATUS: DNR  PAST MEDICAL HISTORY: Past Medical History:  Diagnosis Date   Bladder cancer ([place]) 07/21/2019   Cancer ([place])    Bladder cancer    PAST SURGICAL HISTORY:  Past Surgical History:  Procedure Laterality Date   BLADDER SURGERY     BRAIN SURGERY     plate in head per patient since childhood    PORTACATH PLACEMENT Left 07/21/2019   Procedure: INSERTION PORT-A-CATH;  Surgeon: Olean Ree, MD;  Location: [facility] ORS;  Service: General;  Laterality: Left;    HEMATOLOGY/ONCOLOGY HISTORY:  Oncology History  Bladder cancer ([place])  07/21/2019 Initial Diagnosis   Bladder cancer ([place])   07/27/2019 - 07/27/2019  Chemotherapy   The patient had PALONOSETRON HCL INJECTION 0.25 MG/5ML, 0.25 mg, Intravenous,  Once, 0 of 4 cycles CARBOplatin (PARAPLATIN) in sodium chloride 0.9 % 100 mL chemo infusion, , Intravenous,  Once, 0 of 4 cycles gemcitabine (GEMZAR) 1,900 mg in sodium chloride 0.9 % 250 mL chemo infusion, 1,000 mg/m2, Intravenous,  Once, 0 of 4 cycles FOSAPREPITANT 150MG  + DEXAMETHASONE INFUSION [facility], , Intravenous,  Once, 0 of 4 cycles  for chemotherapy treatment.    08/03/2019 - 08/03/2019 Chemotherapy   The patient had palonosetron (ALOXI) injection 0.25 mg, 0.25 mg, Intravenous,  Once, 0 of 6 cycles CISplatin (PLATINOL) 133 mg in sodium chloride 0.9 % 500 mL chemo infusion, 70 mg/m2, Intravenous,  Once, 0 of 6 cycles gemcitabine (GEMZAR) 1,900 mg in sodium chloride 0.9 % 250 mL chemo infusion, 1,000 mg/m2, Intravenous,  Once, 0 of 6 cycles fosaprepitant (EMEND) 150 mg, dexamethasone (DECADRON) 12 mg in sodium chloride 0.9 % 145 mL IVPB, , Intravenous,  Once, 0 of 6 cycles  for chemotherapy treatment.    08/10/2019 -  Chemotherapy   The patient had PALONOSETRON HCL INJECTION 0.25 MG/5ML, 0.25 mg, Intravenous,  Once, 0 of 4 cycles CARBOplatin (PARAPLATIN) in sodium chloride 0.9 % 100 mL chemo infusion, , Intravenous,  Once, 0 of 4 cycles gemcitabine (GEMZAR) 1,900 mg in sodium chloride 0.9 % 250 mL chemo infusion, 1,000 mg/m2, Intravenous,  Once, 0 of 4 cycles FOSAPREPITANT 150MG  + DEXAMETHASONE INFUSION [facility], , Intravenous,  Once, 0 of 4 cycles  for chemotherapy treatment.      ALLERGIES:  has No Known Allergies.  MEDICATIONS:  Current Facility-Administered Medications  Medication Dose Route Frequency Provider Last Rate Last Dose   0.9 %  sodium chloride infusion   Intravenous Continuous Monia Sabal, PA-C       acetaminophen (TYLENOL) tablet 650 mg  650 mg Oral Q6H PRN Harrie Foreman, MD   650 mg at 08/16/19 1440   Or   acetaminophen (TYLENOL) suppository 650 mg  650 mg Rectal  Q6H PRN Harrie Foreman, MD       Chlorhexidine Gluconate Cloth 2 % PADS 6 each  6 each Topical Daily Harrie Foreman, MD   6 each at 08/16/19 0925   dexamethasone (DECADRON) injection 4 mg  4 mg Intravenous Marlou Starks, MD   4 mg at 08/16/19 [id number]   docusate sodium (COLACE) capsule 100 mg  100 mg Oral BID Harrie Foreman, MD   100 mg at 08/16/19 [id number]   feeding supplement (BOOST / RESOURCE BREEZE) liquid 1 Container  1 Container Oral TID BM Tylene Fantasia, PA-C   1 Container at 08/16/19 1316   furosemide (LASIX) injection 20 mg  20 mg Intravenous Q6H Samuella Cota, MD   20 mg at 08/16/19 1316   HYDROmorphone (DILAUDID) injection 0.5 mg  0.5 mg Intravenous Q3H PRN Sela Hua, MD   0.5 mg at 08/16/19 1308   lidocaine-EPINEPHrine (XYLOCAINE W/EPI) 1 %-1:100000 (with pres) injection 10 mL  10 mL Intradermal Once Mayo, Pete Pelt, MD       metoCLOPramide (REGLAN) injection 10 mg  10 mg Intravenous Girtha Rm, MD   10 mg at 08/16/19 1316   metoprolol tartrate (LOPRESSOR) tablet 25 mg  25 mg Oral BID Sela Hua, MD   25 mg at 08/16/19 [id number]   nicotine (NICODERM CQ - dosed in mg/24 hours) patch 21 mg  21 mg Transdermal Daily Harrie Foreman, MD   21 mg at 08/15/19 2142   octreotide (SANDOSTATIN) injection 100 mcg  100 mcg Subcutaneous Q12H Piscoya, Jose, MD   100 mcg at 08/16/19 1441   ondansetron (ZOFRAN) tablet 4 mg  4 mg Oral Q6H PRN Harrie Foreman, MD       Or   ondansetron [facility]) injection 4 mg  4 mg Intravenous Q6H PRN Harrie Foreman, MD   4 mg at 08/13/19 0353   pantoprazole (PROTONIX) EC tablet 40 mg  40 mg Oral Daily Sela Hua, MD   40 mg at 08/16/19 0924   polyethylene glycol (MIRALAX / GLYCOLAX) packet 17 g  17 g Oral Daily Tylene Fantasia, PA-C   17 g at 08/16/19 [id number]   prochlorperazine (COMPAZINE) injection 10 mg  10 mg Intravenous Q6H PRN Harrie Foreman, MD       senna-docusate (Senokot-S) tablet 2 tablet  2 tablet Oral  Daily Harrie Foreman, MD   2 tablet at 08/16/19 0925    VITAL SIGNS: BP (!) 153/117 (BP Location: Left Arm)    Pulse 93    Temp 98.7 F (37.1 C)    Resp 14    Wt 159 lb 9.8 oz (72.4 kg)    SpO2 97%    BMI 21.06 kg/m  Filed Weights   08/09/19 0500 08/13/19 0354 08/14/19 0318  Weight: 152 lb 1.9 oz (69 kg) 161 lb 9.6 oz (73.3 kg) 159 lb 9.8 oz (72.4 kg)    Estimated body mass index is 21.06 kg/m as calculated from the following:   Height as of 08/02/19: 6\' 1"  (1.854 m).   Weight as of this encounter: 159 lb 9.8 oz (72.4 kg).  LABS: CBC:  Component Value Date/Time   WBC 10.4 08/15/2019 0440   HGB 13.6 08/15/2019 0440   HGB 8.7 (L) 03/02/2014 0948   HCT 40.9 08/15/2019 0440   HCT 26.2 (L) 03/02/2014 0948   PLT 61 (L) 08/15/2019 0440   PLT 136 (L) 03/02/2014 0948   MCV 87.0 08/15/2019 0440   MCV 88 03/02/2014 0948   NEUTROABS 8.4 (H) 08/15/2019 0440   NEUTROABS 12.4 (H) 03/02/2014 0948   LYMPHSABS 1.1 08/15/2019 0440   LYMPHSABS 1.4 03/02/2014 0948   MONOABS 0.7 08/15/2019 0440   MONOABS 0.5 03/02/2014 0948   EOSABS 0.0 08/15/2019 0440   EOSABS 0.3 03/02/2014 0948   BASOSABS 0.0 08/15/2019 0440   BASOSABS 0.0 03/02/2014 0948   Comprehensive Metabolic Panel:    Component Value Date/Time   NA 134 (L) 08/14/2019 0542   NA 138 02/26/2014 0307   K 3.8 08/14/2019 0542   K 3.8 02/26/2014 0307   CL 104 08/14/2019 0542   CL 107 02/26/2014 0307   CO2 22 08/14/2019 0542   CO2 27 02/26/2014 0307   BUN 23 08/14/2019 0542   BUN 17 02/26/2014 0307   CREATININE 0.72 08/14/2019 0542   CREATININE 1.08 02/26/2014 0307   GLUCOSE 115 (H) 08/14/2019 0542   GLUCOSE 89 02/26/2014 0307   CALCIUM 8.2 (L) 08/14/2019 0542   CALCIUM 7.4 (L) 02/26/2014 0307   AST 32 08/07/2019 1115   AST 20 02/23/2014 2141   ALT 18 08/07/2019 1115   ALT 18 02/23/2014 2141   ALKPHOS 143 (H) 08/07/2019 1115   ALKPHOS 49 02/23/2014 2141   BILITOT 1.4 (H) 08/07/2019 1115   BILITOT 0.3 02/23/2014 2141    PROT 7.4 08/07/2019 1115   PROT 5.1 (L) 02/23/2014 2141   ALBUMIN 3.7 08/07/2019 1115   ALBUMIN 2.6 (L) 02/23/2014 2141    RADIOGRAPHIC STUDIES: Dg Abd 1 View  Result Date: 08/16/2019 CLINICAL DATA:  Abdominal pain and distension EXAM: ABDOMEN - 1 VIEW COMPARISON:  08/10/2019 FINDINGS: Scattered large and small bowel gas is noted. Persistent small bowel dilatation is seen an increased when compared with the prior exam. Right percutaneous nephrostomy catheter is again noted. No free air is seen. No focal mass lesion is noted. No bony abnormality is noted. IMPRESSION: Increasing small bowel dilatation when compare with the prior exam consistent with a worsening obstruction. Electronically Signed   By: Inez Catalina M.D.   On: 08/16/2019 14:09   Dg Abd 1 View  Result Date: 08/10/2019 CLINICAL DATA:  68 year old male with a history of small bowel obstruction EXAM: ABDOMEN - 1 VIEW COMPARISON:  08/09/2019 FINDINGS: Similar appearance of the lower chest. Gastric tube terminates within the stomach. Unchanged appearance of the right percutaneous nephrostomy. Dilated small bowel loops within the mid abdomen. Paucity of colonic and rectal gas. Small formed stool within the rectum. No unexpected radiopaque foreign body. No displaced fracture IMPRESSION: Dilated small bowel loops indicating ongoing obstruction/ileus. Relative absence of colonic gas. Unchanged gastric tube and right percutaneous nephrostomy. Electronically Signed   By: Corrie Mckusick D.O.   On: 08/10/2019 08:09   Dg Abd 1 View  Result Date: 08/09/2019 CLINICAL DATA:  Small bowel obstruction EXAM: ABDOMEN - 1 VIEW COMPARISON:  08/07/2019 FINDINGS: NG tube tip is within the fundus of the stomach, unchanged. Right side nephrostomy catheter again noted, unchanged. There are dilated small bowel loops compatible with small bowel obstruction, slightly progressed since prior study. No organomegaly or free air. IMPRESSION: Small-bowel dilatation  slightly progressed since prior study.  Findings compatible with continued small bowel obstruction. Electronically Signed   By: Rolm Baptise M.D.   On: 08/09/2019 09:42   Ct Abdomen Pelvis W Contrast  Result Date: 08/07/2019 CLINICAL DATA:  Emesis for 2 days, history of bladder cancer EXAM: CT ABDOMEN AND PELVIS WITH CONTRAST TECHNIQUE: Multidetector CT imaging of the abdomen and pelvis was performed using the standard protocol following bolus administration of intravenous contrast. CONTRAST:  167mL OMNIPAQUE IOHEXOL 300 MG/ML  SOLN COMPARISON:  PET-CT, 07/25/2019 FINDINGS: Lower chest: No acute abnormality. Metastatic nodules in the partially included lingula (series 4, image 9). Emphysema. Hepatobiliary: No solid liver abnormality is seen. No gallstones, gallbladder wall thickening, or biliary dilatation. Pancreas: Unremarkable. No pancreatic ductal dilatation or surrounding inflammatory changes. Spleen: Normal in size without significant abnormality. Adrenals/Urinary Tract: Adrenal glands are unremarkable. Right-sided percutaneous nephrostomy with formed pigtail in the right renal pelvis. No hydronephrosis. Redemonstrated, bulky, predominantly endoluminal bladder mass and soft tissue nodules. Stomach/Bowel: There is redemonstrated, extensive, bulky, metastatic tissue involving the omentum, multiple loops of small bowel, and the anterior abdominal wall. The stomach and proximal to mid small bowel are diffusely fluid distended, with an abrupt transition point in the central abdomen abutting metastatic soft tissue (series 2, image 59) and decompression of the distal small bowel. Vascular/Lymphatic: Aortic atherosclerosis. Multiple enlarged retroperitoneal, iliac, and pelvic sidewall lymph nodes as seen on prior examination. Reproductive: No mass or other significant abnormality. Other: No abdominal wall hernia or abnormality. No abdominopelvic ascites. Musculoskeletal: No acute or significant osseous findings.  IMPRESSION: 1. There is redemonstrated, extensive, bulky, metastatic tissue involving the omentum, multiple loops of small bowel, and the anterior abdominal wall, not significant changed in appearance compared to prior PET-CT examination dated 07/25/2019. The stomach and proximal to mid small bowel are diffusely fluid distended, with an abrupt transition point in the central abdomen abutting metastatic soft tissue (series 2, image 59) and decompression of the distal small bowel. Findings are consistent with small bowel obstruction. 2. Other findings of primary bladder malignancy and advanced metastatic disease as detailed above, not significantly changed in comparison to PET-CT dated 07/25/2019. 3. Aortic Atherosclerosis (ICD10-I70.0) and Emphysema (ICD10-J43.9). Electronically Signed   By: Eddie Candle M.D.   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 EXAM: NUCLEAR MEDICINE PET SKULL BASE TO THIGH TECHNIQUE: 8.795 mCi F-18 FDG was injected intravenously. Full-ring PET imaging was performed from the skull base to thigh after the radiotracer. CT data was obtained and used for attenuation correction and anatomic localization. Fasting blood glucose: 93 mg/dl COMPARISON:  None. FINDINGS: Mediastinal blood pool activity: SUV max 1.36 . Liver activity: SUV max NA NECK: Enlarged and hypermetabolic left level 2 lymph nodes. Nodal mass measures 3.4 by 1.9 cm and has an SUV max of 7.06 Incidental CT findings: none CHEST: Hypermetabolic right paratracheal, subcarinal and bilateral hilar lymph nodes are identified. Right paratracheal node measures 1.4 cm and has an SUV max of 10.38. Index subcarinal node measures 1.1 cm and has an SUV max of 8.93. Index left hilar node measures 1.2 cm and has an SUV max of 6.41. Right hilar lymph node measures 1.2 cm and has an SUV max 8.66. Right CP angle  lymph node is FDG avid measuring 1.4 cm with SUV max of 7.57. Bilateral FDG avid pulmonary nodules. Right lower lobe lung nodule measures 1 cm and has an SUV max of  [id number]. Hypermetabolic subpleural nodule in the lingula measures 1 cm and has an SUV max of 6.6. Incidental CT findings: Advanced changes of emphysema. Aortic atherosclerosis. ABDOMEN/PELVIS: There is a few small foci of FDG uptake above liver background activity without definite CT correlate, equivocal for metastasis. For example, within segment 8 there is an area of increased uptake within SUV max of 2.67. This is compared with background liver activity of 2.15. No abnormal uptake within the pancreas, spleen or adrenal glands. Soft tissue mass involving the bladder measures 9 cm within SUV max of 15.7. Hypermetabolic right retrocrural lymph node measures 1 cm and has an SUV max of 7.13. Multiple hypermetabolic retroperitoneal and bilateral iliac and inguinal lymph nodes identified. Index left periaortic node measures 1.1 cm within SUV max of 8.01. Index retrocaval lymph node measures 1.3 cm and has an SUV max of 6.25. Left common iliac node measures 1.3 cm and has an SUV max of 1.2. Right common iliac node measures 1.5 cm and has an SUV max of 6.4. Left external iliac node measures 1.8 cm with SUV max of 8.79. Left inguinal node measures 0.7 cm and has an SUV max of 4.87. Left pericolic gutter soft tissue mass measures 12.4 cm and has SUV max of 11.9. Right lateral abdominal mass measures 8 cm and has SUV max of 12.07. Large mass within the ventral abdomen measures 16.6 cm within SUV max of 11.36. Small FDG avid peritoneal implants are identified along the surface of the liver. Incidental CT findings: Right-sided percutaneous nephrostomy tube is in place. No hydronephrosis identified bilaterally. Aortic atherosclerosis. SKELETON: No focal hypermetabolic activity to suggest skeletal metastasis. Incidental CT findings: none IMPRESSION: 1. Large mass within  the urinary bladder is identified and is hypermetabolic concerning for primary urothelial neoplasm. 2. Hypermetabolic left cervical, thoracic, abdominal, and pelvic lymph nodes consistent with metastatic adenopathy. 3. Extensive, bulky, hypermetabolic peritoneal disease within the abdomen and pelvis. 4. Bilateral pulmonary nodules concerning for metastatic disease. 5. A few small foci (without CT correlate) of mild FDG uptake are noted in the liver which are equivocal for metastasis. 6. No findings to suggest osseous metastasis. 7. Aortic Atherosclerosis (ICD10-I70.0) and Emphysema (ICD10-J43.9). Electronically Signed   By: Kerby Moors M.D.   On: 07/25/2019 13:30   Dg Chest Portable 1 View  Result Date: 08/07/2019 CLINICAL DATA:  Elevated white count, emesis for 3 days. EXAM: PORTABLE CHEST 1 VIEW COMPARISON:  Chest radiograph dated 07/21/2019 FINDINGS: The heart size and mediastinal contours are within normal limits. Emphysematous changes are noted. Both lungs are clear. The visualized skeletal structures are unremarkable. A left subclavian approach central venous port catheter tip overlies the superior vena cava. A pigtail catheter overlies the right upper quadrant. IMPRESSION: No acute cardiopulmonary findings. Emphysema (ICD10-J43.9). Electronically Signed   By: Zerita Boers M.D.   On: 08/07/2019 15:17   Dg Chest Port 1 View  Result Date: 07/21/2019 CLINICAL DATA:  Port placement.  Metastatic bladder cancer. EXAM: PORTABLE CHEST 1 VIEW COMPARISON:  Chest x-ray dated Feb 24, 2014. FINDINGS: New left chest wall port catheter with tip in the mid SVC. The heart size and mediastinal contours are within normal limits. Chronically coarsened interstitial markings with emphysematous changes. Small area of nodularity at the left lung base. No focal consolidation, pleural effusion, or pneumothorax. No acute osseous abnormality. IMPRESSION: 1. New appropriately positioned left chest wall port catheter without  complicating feature. 2. Small area of nodularity at the left lung base. Metastatic disease is not excluded.  Electronically Signed   By: Titus Dubin M.D.   On: 07/21/2019 19:39   Dg Abd Portable 1 View  Result Date: 08/07/2019 CLINICAL DATA:  NG tube placement EXAM: PORTABLE ABDOMEN - 1 VIEW COMPARISON:  CT abdomen/pelvis 08/07/2019 FINDINGS: Nasogastric tube with the tip projecting over the stomach. There is ill-defined dilatation of the small bowel consistent with bowel obstruction. There is no evidence of pneumoperitoneum, portal venous gas or pneumatosis. There are no pathologic calcifications along the expected course of the ureters. Right percutaneous nephrostomy tube. The osseous structures are unremarkable. IMPRESSION: 1. Nasogastric tube with the tip projecting over the stomach. Electronically Signed   By: Kathreen Devoid   On: 08/07/2019 17:39   Dg C-arm 1-60 Min-no Report  Result Date: 07/21/2019 Fluoroscopy was utilized by the requesting physician.  No radiographic interpretation.    PERFORMANCE STATUS (ECOG) : 3 - Symptomatic, >50% confined to bed  Review of Systems Unless otherwise noted, a complete review of systems is negative.  Physical Exam General: NAD, frail appearing, thin Pulmonary: Unlabored Abdomen: soft, nontender, + bowel sounds GU: no suprapubic tenderness Extremities: no edema, no joint deformities Skin: no rashes Neurological: Weakness but otherwise nonfocal  IMPRESSION: Patient with worsening abdominal distention after resuming soft foods.  KUB today shows interval worsening obstruction.  Patient has opted to have hospice involvement at home with focus on comfort.  Would consider option of venting PEG or CT-guided drain for palliation if IR feels it reasonable.  Case discussed with Dr. Tasia Catchings.  PLAN: -Plan for hospice at home when medically ready -Consider venting PEG for palliation of symptoms if IR feels it is an option    Time Total: 15  minutes  Visit consisted of counseling and education dealing with the complex and emotionally intense issues of symptom management and palliative care in the setting of serious and potentially life-threatening illness.Greater than 50%  of this time was spent counseling and coordinating care related to the above assessment and plan.  Signed by: Altha Harm, PhD, NP-C

## 2019-08-16 NOTE — Progress Notes (Signed)
[facility] Day(s): 9.   Interval History:  Patient seen and examined no acute events or new complaints overnight.  Patient reports he is doing okay. Has some abdominal discomfort this morning but he reports this is unchanged. No nausea or emesis. On soft diet yesterday, he continues to endorse flatus but no BM. He does feel he is getting slightly more distended.    Vital signs in last 24 hours: [min-max] current  Temp:  [98.3 F (36.8 C)-98.7 F (37.1 C)] 98.6 F (37 C) (11/03 0430) Pulse Rate:  [84-97] 84 (11/03 0430) Resp:  [14-20] 18 (11/03 0430) BP: (123-154)/(82-103) 136/82 (11/03 0430) SpO2:  [97 %-98 %] 97 % (11/03 0430)       Weight: 72.4 kg BMI (Calculated): 21.06   Intake/Output last 2 shifts:  11/02 0701 - 11/03 0700 In: 1650 [P.O.:1650] Out: [id number] [Urine:1275]   Physical Exam:  Constitutional: alert, cooperative and no distress  HENT: normocephalic without obvious abnormality Eyes: PERRL, EOM's grossly intact and symmetric  Respiratory: breathing non-labored at rest Chest:Port site in left upper chest healing well, dressing in place.  Cardiovascular: regular rate and sinus rhythm  Gastrointestinal: soft, non-tender,does appear more distended this AM. There is a palpable firm mass in the supraumbilical region. No rebound/guarding Genitourinary: Nephrostomy tube in place, making good urine   Labs:  CBC Latest Ref Rng & Units 08/15/2019 08/14/2019 08/13/2019  WBC 4.0 - 10.5 K/uL 10.4 11.4(H) 12.0(H)  Hemoglobin 13.0 - 17.0 g/dL 13.6 13.7 13.0  Hematocrit 39.0 - 52.0 % 40.9 40.9 39.7  Platelets 150 - 400 K/uL 61(L) 74(L) 107(L)   CMP Latest Ref Rng & Units 08/14/2019 08/13/2019 08/12/2019  Glucose 70 - 99 mg/dL 115(H) 146(H) 132(H)  BUN 8 - 23 mg/dL 23 22 26(H)  Creatinine 0.61 - 1.24 mg/dL 0.72 0.66 0.81  Sodium 135 - 145 mmol/L 134(L) 138 143  Potassium 3.5 - 5.1 mmol/L 3.8 3.8 4.1  Chloride 98 - 111 mmol/L  104 109 112(H)  CO2 22 - 32 mmol/L 22 23 16(L)  Calcium 8.9 - 10.3 mg/dL 8.2(L) 8.3(L) 8.5(L)  Total Protein 6.5 - 8.1 g/dL - - -  Total Bilirubin 0.3 - 1.2 mg/dL - - -  Alkaline Phos 38 - 126 U/L - - -  AST 15 - 41 U/L - - -  ALT 0 - 44 U/L - - -     Imaging studies: No new pertinent imaging studies   Assessment/Plan:  68 y.o. male now with return of abdominal distension although still with evidence of bowel function admitted with small bowel obstruction related to peritoneal carcinomatosis, complicated by pertinent comorbidities includingmetastatic bladder CA   - Back down to CLD this morning   - Serial abdominal examination +/- KUBs - Appreciate oncology and palliative consults - Again, this is a very difficult situation and surgical intervention has low yield given likelihood of peritoneal carcinomatosis and possible frozen abdomen. Will continue conservative measures described above - pain control prn - mobilization encouraged - Further management per primary service  All of the above findings and recommendations were discussed with the patient, and the medical team, and all of patient's questions were answered to his expressed satisfaction.  -- Edison Simon, PA-C [facility] 08/16/2019, 7:34 AM [phone] M-F: 7am - 4pm

## 2019-08-16 NOTE — Progress Notes (Signed)
PROGRESS NOTE  Johnny Martin [id number] DOB: May 07, 1951 DOA: 08/07/2019 PCP: Earlie Server, MD  Brief History   68 year old man PMH metastatic bladder cancer presented with nausea and vomiting.  Admitted for small bowel obstruction, seen by general surgery, because of peritoneal carcinomatosis, palliative oncology consultations were obtained.  He was treated with octreotide and steroids.  He developed an open area over his Port-A-Cath site.  Port was removed by surgery.  Bowel obstruction thus far has failed to resolve.  Conservative measures still recommended by all parties.  Oncology has recommended supportive care, palliating small bowel obstruction symptoms and transition to hospice, comfort care.  Patient agreed with plan.  Palliative medicine following.  A & P  Small bowel obstruction secondary metastatic bladder cancer, peritoneal carcinomatosis.  Not a surgical candidate secondary to extensive metastatic burden in abdomen.  Followed by general surgery.  Treated with octreotide and metoclopramide. --No significant improvement, diet decreased, oncology is recommending KUB consideration of Gastrografin oral contrast.  Defer to oncology and general surgery. --Continue octreotide, Reglan and dexamethasone per oncology --Oncology as recommended supportive care, evaluating small bowel obstruction symptoms, transition to hospice and comfort care.  Patient agreeable.  Palliative medicine following.  Scrotal edema, +2.7 L since admission --Slightly worse today.  Some lower extremity edema.  I/O+ overnight 375.  +2.2 L since admission. --Had oral Lasix already this morning.  Will give IV Lasix today and again tomorrow.  Thrombocytopenia --Likely due to consumption.  Oncology anticipates recovery in the next few days.  Status post port removal for skin erosion. --Continue local wound care.  Metastatic bladder cancer.  Prognosis is poor.  Palliative chemotherapy plus/minus immunotherapy delayed  secondary to small bowel obstruction.  Status post nephrostomy tube on the right. --Poor prognosis.  Oncology has recommended hospice.  Acute kidney injury --Resolved.  Elevated TSH --No further evaluation suggested  Severe malnutrition in the context of chronic illness --Continue boost or Ensure as able  Aortic atherosclerosis  Emphysema  Sepsis secondary to UTI considered on admission.  However this was ruled out with negative cultures.  Blood cultures no growth.  Urine culture with multiple species.  Antibiotics stopped.  DVT prophylaxis: SCDs Code Status: DNR Family Communication: Disposition Plan: Home with hospice   Murray Hodgkins, MD  [facility] Direct contact: see [email or web address] (further directions at bottom of note if needed) 7PM-7AM contact night coverage as at bottom of note 08/16/2019, 1:51 PM  LOS: 9 days   Significant Hospital Events   . 10/25 admitted for small bowel obstruction.  General surgery consultation . 10/26 oncology consultation . 10/27 palliative medicine consultation   Consults:  . General surgery . Oncology . Palliative medicine   Procedures:  . 11/1 bedside removal of Port-A-Cath  Significant Diagnostic Tests:  . SARS Covid negative . CT abdomen pelvis: Bulky metastatic disease involving the omentum, multiple loops of small bowel, anterior abdominal wall.  Stomach and proximal to mid small bowel diffusely distended with transition point noted consistent with small bowel obstruction.   Micro Data:  . Blood cultures no growth, final . Urine culture multiple species present   Antimicrobials:  .   Interval History/Subjective  Overall doing okay.  No bowel movement yet.  Had dinner last night but has been back down to liquids today.  Objective   Vitals:  Vitals:   08/16/19 0917 08/16/19 1259  BP: (!) 153/82 (!) 153/117  Pulse: 91 93  Resp: 18 14  Temp: 98 F (36.7 C) 98.7 F (37.1 C)  SpO2: 97% 97%    Exam:   Constitutional: Appears calm, comfortable Respiratory: Clear to auscultation bilaterally.  No wheezes, rales or rhonchi.  Normal respiratory effort. Cardiovascular.  Regular rate and rhythm.  No murmur, rub or gallop.  1+ bilateral lower extremity edema.  No significant change in scrotal edema.  Penile edema appears increased today. Abdomen distended. Psychiatric: Grossly normal mood and affect.  Speech fluent and appropriate.  I have personally reviewed the following:   Today's Data  . No new data  Scheduled Meds: . Chlorhexidine Gluconate Cloth  6 each Topical Daily  . dexamethasone  4 mg Intravenous Q12H  . docusate sodium  100 mg Oral BID  . feeding supplement  1 Container Oral TID BM  . furosemide  20 mg Intravenous Q6H  . lidocaine-EPINEPHrine  10 mL Intradermal Once  . metoCLOPramide (REGLAN) injection  10 mg Intravenous Q8H  . metoprolol tartrate  25 mg Oral BID  . nicotine  21 mg Transdermal Daily  . octreotide  100 mcg Subcutaneous Q12H  . pantoprazole  40 mg Oral Daily  . polyethylene glycol  17 g Oral Daily  . senna-docusate  2 tablet Oral Daily   Continuous Infusions: . 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  1. Check the care team in [facility] and look for a) attending/consulting TRH provider listed and b) the [facility] team listed 2. Log into [email or web address] and use [place]'s universal password to access. If you do not have the password, please contact the hospital operator. 3. Locate the [facility] provider you are looking for under [facility] and page to a number that you can be directly reached. 4.  If you still have difficulty reaching the provider, please page the [facility] (Director on Call) for the Hospitalists listed on amion for assistance.

## 2019-08-17 NOTE — Progress Notes (Signed)
[place] Hospital Day(s): 10.   Interval History: Patient seen and examined, no acute events or new complaints overnight. Patient reports he feels good this morning. No abdominal pain, nausea, or emesis. Still with flatus. Tolerating liquid diet. Palliative and oncology following.    Vital signs in last 24 hours: [min-max] current  Temp:  [97.6 F (36.4 C)-98.7 F (37.1 C)] 97.9 F (36.6 C) (11/04 0423) Pulse Rate:  [84-95] 84 (11/04 0423) Resp:  [14-20] 16 (11/04 0423) BP: (126-153)/(78-117) 135/78 (11/04 0423) SpO2:  [94 %-97 %] 95 % (11/04 0423)       Weight: 72.4 kg BMI (Calculated): 21.06   Intake/Output last 2 shifts:  11/03 0701 - 11/04 0700 In: 1570 [P.O.:1560] Out: 2050 [Urine:2050]   Physical Exam:  Constitutional: alert, cooperative and no distress  HENT: normocephalic without obvious abnormality Eyes: PERRL, EOM's grossly intact and symmetric  Respiratory: breathing non-labored at rest Chest:Port site in left upper chest healing well, dressing in place.  Cardiovascular: regular rate and sinus rhythm  Gastrointestinal: soft, non-tender,still mildly distended. There is a palpable firm mass in the supraumbilical region. No rebound/guarding Genitourinary: Nephrostomy tube in place, making good urine  Labs:  CBC Latest Ref Rng & Units 08/15/2019 08/14/2019 08/13/2019  WBC 4.0 - 10.5 K/uL 10.4 11.4(H) 12.0(H)  Hemoglobin 13.0 - 17.0 g/dL 13.6 13.7 13.0  Hematocrit 39.0 - 52.0 % 40.9 40.9 39.7  Platelets 150 - 400 K/uL 61(L) 74(L) 107(L)   CMP Latest Ref Rng & Units 08/14/2019 08/13/2019 08/12/2019  Glucose 70 - 99 mg/dL 115(H) 146(H) 132(H)  BUN 8 - 23 mg/dL 23 22 26(H)  Creatinine 0.61 - 1.24 mg/dL 0.72 0.66 0.81  Sodium 135 - 145 mmol/L 134(L) 138 143  Potassium 3.5 - 5.1 mmol/L 3.8 3.8 4.1  Chloride 98 - 111 mmol/L 104 109 112(H)  CO2 22 - 32 mmol/L 22 23 16(L)  Calcium 8.9 - 10.3 mg/dL 8.2(L) 8.3(L) 8.5(L)  Total  Protein 6.5 - 8.1 g/dL - - -  Total Bilirubin 0.3 - 1.2 mg/dL - - -  Alkaline Phos 38 - 126 U/L - - -  AST 15 - 41 U/L - - -  ALT 0 - 44 U/L - - -    Imaging studies:  KUB (08/16/2019) personally reviewed which shows increase in bowel dilation, radiologist report reviewed:  IMPRESSION: Increasing small bowel dilatation when compare with the prior exam consistent with a worsening obstruction.   Assessment/Plan:  68 y.o. male with possible partial small bowel obstruction related to peritoneal carcinomatosis, complicated by pertinent comorbidities includingmetastatic bladder CA   - Continue on liquid diet   - If he develops nausea/emesis then he may require placement of NGT which he understands  - Can consider GI or IR evaluation for PEG Tube placement for venting however his history of carcinomatosis makes this challenging and surgical placement of this is unlikely an option.    - Serial abdominal examination +/- KUBs - Appreciate oncology and palliative consults - Again, this is a very difficult situation and surgical intervention has low yield given likelihood of peritoneal carcinomatosis and possible frozen abdomen. Will continue conservative measures described above - pain control prn - mobilization encouraged - Further management per primary service  All of the above findings and recommendations were discussed with the patient, and the medical team, and all of patient's questions were answered to his expressed satisfaction.  -- Edison Simon, PA-C [facility] 08/17/2019, 8:07 AM [phone] M-F: 7am -  4pm

## 2019-08-17 NOTE — Progress Notes (Signed)
[address] at [place] NAME: Johnny Martin    MR#:  [id number]  DATE OF BIRTH:  [birth year]  SUBJECTIVE:  patient feels better today. Tylenol is working for pain. He is tolerating full liquid diet. He had a small bowel movement.  Daughter Johnny Martin in the room. Patient lives with Johnny Martin at home.   REVIEW OF SYSTEMS:   Review of Systems  Constitutional: Positive for malaise/fatigue and weight loss. Negative for chills and fever.  HENT: Negative for ear discharge, ear pain and nosebleeds.   Eyes: Negative for blurred vision, pain and discharge.  Respiratory: Negative for sputum production, shortness of breath, wheezing and stridor.   Cardiovascular: Negative for chest pain, palpitations, orthopnea and PND.  Gastrointestinal: Negative for abdominal pain, diarrhea, nausea and vomiting.  Genitourinary: Negative for frequency and urgency.  Musculoskeletal: Negative for back pain and joint pain.  Neurological: Positive for weakness. Negative for sensory change, speech change and focal weakness.  Psychiatric/Behavioral: Negative for depression and hallucinations. The patient is not nervous/anxious.    Tolerating Diet:FLD Tolerating PT: ambulating by self  DRUG ALLERGIES:  No Known Allergies  VITALS:  Blood pressure 136/83, pulse 74, temperature (!) 97.4 F (36.3 C), temperature source Oral, resp. rate 18, weight 72.4 kg, SpO2 97 %.  PHYSICAL EXAMINATION:   Physical Exam  GENERAL:  68 y.o.-year-old patient lying in the bed with no acute distress. Chronically ill EYES: Pupils equal, round, reactive to light and accommodation. No scleral icterus. Extraocular muscles intact.  HEENT: Head atraumatic, normocephalic. Oropharynx and nasopharynx clear.  NECK:  Supple, no jugular venous distention. No thyroid enlargement, no tenderness.  LUNGS: Normal breath sounds bilaterally, no wheezing, rales, rhonchi. No use of accessory muscles of respiration.   CARDIOVASCULAR: S1, S2 normal. No murmurs, rubs, or gallops.  ABDOMEN: firm, nontender, ++distended. Few Bowel sounds present. No organomegaly or mass.  EXTREMITIES: No cyanosis, clubbing or edema b/l.    NEUROLOGIC: Cranial nerves II through XII are intact. No focal Motor or sensory deficits b/l.   PSYCHIATRIC:  patient is alert and oriented x 3.  SKIN: No obvious rash, lesion, or ulcer.   LABORATORY PANEL:  CBC Recent Labs  Lab 08/15/19 0440  WBC 10.4  HGB 13.6  HCT 40.9  PLT 61*    Chemistries  Recent Labs  Lab 08/14/19 0542  NA 134*  K 3.8  CL 104  CO2 22  GLUCOSE 115*  BUN 23  CREATININE 0.72  CALCIUM 8.2*   Cardiac Enzymes No results for input(s): TROPONINI in the last 168 hours. RADIOLOGY:  Dg Abd 1 View  Result Date: 08/16/2019 CLINICAL DATA:  Abdominal pain and distension EXAM: ABDOMEN - 1 VIEW COMPARISON:  08/10/2019 FINDINGS: Scattered large and small bowel gas is noted. Persistent small bowel dilatation is seen an increased when compared with the prior exam. Right percutaneous nephrostomy catheter is again noted. No free air is seen. No focal mass lesion is noted. No bony abnormality is noted. IMPRESSION: Increasing small bowel dilatation when compare with the prior exam consistent with a worsening obstruction. Electronically Signed   By: Inez Catalina M.D.   On: 08/16/2019 14:09   ASSESSMENT AND PLAN:   Small bowel obstruction secondary metastatic bladder cancer, peritoneal carcinomatosis.   -Not a surgical candidate secondary to extensive metastatic burden in abdomen.  Followed by general surgery.  - Treated with octreotide and metoclopramide. --No significant improvement, diet decreased, oncology is recommending KUB consideration of Gastrografin oral contrast.  Defer to oncology and general surgery. --Continue octreotide, Reglan and dexamethasone per oncology --Oncology as recommended supportive care, evaluating small bowel obstruction symptoms, transition  to hospice and comfort care.  Patient agreeable.  Palliative medicine following. -Dr Tasia Catchings to d/w dter Johnny Martin today. I spoke with Johnny Martin in the room  Scrotal edema, +2.7 L since admission --Slightly worse today.  Some lower extremity edema.  I/O+ overnight 375.  +2.2 L since admission. --Had oral Lasix   Thrombocytopenia --Likely due to consumption.  Oncology anticipates recovery in the next few days.  Status post port removal for skin erosion. --Continue local wound care.  Metastatic bladder cancer.  Prognosis is poor.  Palliative chemotherapy plus/minus immunotherapy delayed secondary to small bowel obstruction.  Status post nephrostomy tube on the right. --Poor prognosis.  Oncology has recommended hospice.  -Social worker to discuss with family regarding options of hospice  Acute kidney injury --Resolved.  Elevated TSH --No further evaluation suggested  Severe malnutrition in the context of chronic illness --Continue boost or Ensure as able  Case discussed with Care Management/Social Worker. Management plans discussed with the patient, family and they are in agreement.  CODE STATUS: dnr DVT Prophylaxis: scd  TOTAL TIME TAKING CARE OF THIS PATIENT: *30* minutes.  >50% time spent on counselling and coordination of care  POSSIBLE D/C IN *?* DAYS, DEPENDING ON CLINICAL CONDITION.  Note: This dictation was prepared with Dragon dictation along with smaller phrase technology. Any transcriptional errors that result from this process are unintentional.  Johnny Martin M.D on 08/17/2019 at 3:49 PM  Between 7am to 6pm - Pager - [phone]  After 6pm go to [email or web address] - password [email or web address] [facility]   CC: Primary care physician; Earlie Server, MDPatient ID: Johnny Martin, male   DOB: 05-Apr-1951, 68 y.o.   MRN: [id number]

## 2019-08-17 NOTE — Progress Notes (Signed)
New referral for [facility] hospice home received from [facility]. Patient information faxed to referral. Plan to follow up with family in the morning. [name] BSN, RN, [facility] [employer] [phone]

## 2019-08-17 NOTE — Care Management Important Message (Signed)
Important Message  Patient Details  Name: Johnny Martin MRN: [id number] Date of Birth: 07-18-1951   Medicare Important Message Given:  Yes     Juliann Pulse A [place] 08/17/2019, 11:23 AM

## 2019-08-17 NOTE — TOC Initial Note (Signed)
Transition of Care [facility]) - Initial/Assessment Note    Patient Details  Name: Johnny Martin MRN: [id number] Date of Birth: [birth year]  Transition of Care [facility]) CM/SW Contact:    Shelbie Hutching, RN Phone Number: 08/17/2019, 4:05 PM  Clinical Narrative:                 Patient admitted with small bowel obstruction, stage 4 metastatic bladder cancer.  Patient is from home where he lives with his daughter Johnny Martin. Patient has decided on hospice care and would like to go to residential hospice.  Patient verbalizes that he does not want to die at home in front of his daughter and grandson.  RNCM has spoken with patient's other daughter Johnny Martin reports that she is helping her father make decisions about hospice care.  Patient and Johnny Martin choose Residential hospice in [place], [facility].  Residential Hospice referral given to Johnny Martin with [facility].   RNCM will cont to follow.   Expected Discharge Plan: Johnny Martin Barriers to Discharge: Continued Medical Work up   Patient Goals and CMS Choice Patient states their goals for this hospitalization and ongoing recovery are:: Wants to go to the hospice home CMS Medicare.gov Compare Post Acute Care list provided to:: Patient Choice offered to / list presented to : Patient  Expected Discharge Plan and Services Expected Discharge Plan: [place] In-house Referral: Hospice / [place], Nutrition Discharge Planning Services: CM Consult Post Acute Care Choice: Hospice Living arrangements for the past 2 months: Mobile Home                                      Prior Living Arrangements/Services Living arrangements for the past 2 months: Mobile Home Lives with:: Adult Children Patient language and need for interpreter reviewed:: Yes        Need for Family Participation in Patient Care: Yes (Comment)(stage 4 bladder cancer) Care giver support system in place?: Yes (comment)(Johnny Martin)    Criminal Activity/Legal Involvement Pertinent to Current Situation/Hospitalization: No - Comment as needed  Activities of Daily Living Home Assistive Devices/Equipment: None ADL Screening (condition at time of admission) Patient's cognitive ability adequate to safely complete daily activities?: Yes Is the patient deaf or have difficulty hearing?: No Does the patient have difficulty seeing, even when wearing glasses/contacts?: No Does the patient have difficulty concentrating, remembering, or making decisions?: No Patient able to express need for assistance with ADLs?: Yes Does the patient have difficulty dressing or bathing?: No Independently performs ADLs?: Yes (appropriate for developmental age) Does the patient have difficulty walking or climbing stairs?: No Weakness of Legs: None Weakness of Arms/Hands: None  Permission Sought/Granted   Permission granted to share information with : Yes, Verbal Permission Granted     Permission granted to share info w AGENCY: [facility]  Permission granted to share info w Relationship: DAughter's Johnny Martin and Debbi     Emotional Assessment Appearance:: Appears older than stated age Attitude/Demeanor/Rapport: Engaged Affect (typically observed): Accepting Orientation: : Oriented to Self, Oriented to Place, Oriented to  Time, Oriented to Situation Alcohol / Substance Use: Not Applicable Psych Involvement: No (comment)  Admission diagnosis:  Dehydration [E86.0] SBO (small bowel obstruction) ([place]) [K56.609] AKI (acute kidney injury) ([place]) [N17.9] Nausea and vomiting, intractability of vomiting not specified, unspecified vomiting type [R11.2] Sepsis, due to unspecified organism, unspecified whether acute organ dysfunction present [facility]) [A41.9] Patient  Active Problem List   Diagnosis Date Noted  . Abdominal distention   . Thrombocytopenia ([place]) 08/14/2019  . Palliative care encounter   . 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 Protein-calorie malnutrition, severe 08/08/2019  . Non-intractable vomiting   . SBO (small bowel obstruction) (Johnny Martin) 08/07/2019  . Goals of care, counseling/discussion 07/21/2019  . Bladder cancer (Johnny Martin) 07/21/2019  . Drug-induced constipation   . Bladder mass 07/11/2019  . Hydronephrosis due to obstruction of ureter 07/11/2019  . Gross hematuria   . Peritoneal carcinomatosis (Johnny Martin)   . Mass of left side of neck   . AKI (acute kidney injury) (Johnny Martin)    PCP:  Earlie Server, MD Pharmacy:   [id number] - [place], [place] [address] [place] [address] Phone: [phone] Fax: [phone]     Social Determinants of Health (SDOH) Interventions    Readmission Risk Interventions No flowsheet data found.

## 2019-08-17 NOTE — Progress Notes (Signed)
Hematology/Oncology Progress Note [facility] [phone] [phone]  Patient Care Team: Earlie Server, MD as PCP - General (Oncology)   Name of the patient: Johnny Martin  [id number]  07-16-1951  Date of visit: 08/17/19   INTERVAL HISTORY-  Patient was seen at the bedside.   Had a small bowel movement today. Passing flatus.  Denies nausea or vomiting.  On clear liquid. .   Review of systems-  Review of Systems  Constitutional: Positive for appetite change, fatigue and unexpected weight change. Negative for chills and fever.  HENT:   Negative for hearing loss and voice change.   Eyes: Negative for eye problems and icterus.  Respiratory: Negative for chest tightness, cough, hemoptysis and shortness of breath.   Cardiovascular: Negative for chest pain and leg swelling.  Gastrointestinal: Positive for abdominal distention. Negative for abdominal pain.  Endocrine: Negative for hot flashes.  Genitourinary: Negative for difficulty urinating, dysuria and frequency.   Musculoskeletal: Negative for arthralgias.  Skin: Negative for itching and rash.  Neurological: Negative for light-headedness and numbness.  Hematological: Negative for adenopathy. Does not bruise/bleed easily.  Psychiatric/Behavioral: Negative for confusion. The patient is not nervous/anxious.      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)    Protein-calorie malnutrition, severe 08/08/2019   Non-intractable vomiting    SBO (small bowel obstruction) ([place]) 08/07/2019   Goals of care, counseling/discussion 07/21/2019   Bladder cancer ([place]) 07/21/2019   Drug-induced constipation    Bladder mass 07/11/2019   Hydronephrosis due to obstruction of ureter 07/11/2019   Gross hematuria    Peritoneal  carcinomatosis (HCC)    Mass of left side of neck    AKI (acute kidney injury) ([place])      Past Medical History:  Diagnosis Date   Bladder cancer ([place]) 07/21/2019   Cancer ([place])    Bladder cancer     Past Surgical History:  Procedure Laterality Date   BLADDER SURGERY     BRAIN SURGERY     plate in head per patient since childhood    PORTACATH PLACEMENT Left 07/21/2019   Procedure: INSERTION PORT-A-CATH;  Surgeon: Olean Ree, MD;  Location: [facility] ORS;  Service: General;  Laterality: Left;    Social History   Socioeconomic History   Marital status: Divorced    Spouse name: Not on file   Number of children: Not on file   Years of education: Not on file   Highest education level: Not on file  Occupational History   Not on file  Social Needs   Financial resource strain: Not on file   Food insecurity    Worry: Not on file    Inability: Not on file   Transportation needs    Medical: Not on file    Non-medical: Not on file  Tobacco Use   Smoking status: Former Smoker    Packs/day: 0.50    Quit date: 07/13/2019    Years since quitting: 0.0   Smokeless tobacco: Never Used  Substance and Sexual Activity   Alcohol use: Not Currently    Frequency: Never    Comment: quit 3 years ago    Drug use: Never   Sexual activity: Not on file  Lifestyle   Physical activity    Days per week: Not on file  Minutes per session: Not on file   Stress: Not on file  Relationships   Social connections    Talks on phone: Not on file    Gets together: Not on file    Attends religious service: Not on file    Active member of club or organization: Not on file    Attends meetings of clubs or organizations: Not on file    Relationship status: Not on file   Intimate partner violence    Fear of current or ex partner: Not on file    Emotionally abused: Not on file    Physically abused: Not on file    Forced sexual activity: Not on file  Other Topics Concern   Not  on file  Social History Narrative   Lives at home with daughter and son in [place].     Family History  Problem Relation Age of Onset   Cervical cancer Sister    Kidney failure Mother    Stroke Mother    Alzheimer's disease Father      Current Facility-Administered Medications:    0.9 %  sodium chloride infusion, , Intravenous, Continuous, Monia Sabal, PA-C   acetaminophen (TYLENOL) tablet 650 mg, 650 mg, Oral, Q6H PRN, 650 mg at 08/17/19 1529 **OR** acetaminophen (TYLENOL) suppository 650 mg, 650 mg, Rectal, Q6H PRN, Harrie Foreman, MD   Chlorhexidine Gluconate Cloth 2 % PADS 6 each, 6 each, Topical, Daily, Harrie Foreman, MD, 6 each at 08/17/19 1010   dexamethasone (DECADRON) injection 4 mg, 4 mg, Intravenous, Q12H, Earlie Server, MD, 4 mg at 08/17/19 1010   docusate sodium (COLACE) capsule 100 mg, 100 mg, Oral, BID, Harrie Foreman, MD, 100 mg at 08/17/19 1010   feeding supplement (BOOST / RESOURCE BREEZE) liquid 1 Container, 1 Container, Oral, TID BM, Tylene Fantasia, PA-C, 1 Container at 08/17/19 1530   HYDROmorphone (DILAUDID) injection 0.5 mg, 0.5 mg, Intravenous, Q3H PRN, Mayo, Pete Pelt, MD, 0.5 mg at 08/17/19 1318   lidocaine-EPINEPHrine (XYLOCAINE W/EPI) 1 %-1:100000 (with pres) injection 10 mL, 10 mL, Intradermal, Once, Mayo, Pete Pelt, MD   metoCLOPramide (REGLAN) injection 10 mg, 10 mg, Intravenous, Q8H, Earlie Server, MD, 10 mg at 08/17/19 1530   metoprolol tartrate (LOPRESSOR) tablet 25 mg, 25 mg, Oral, BID, Mayo, Pete Pelt, MD, 25 mg at 08/17/19 1009   nicotine (NICODERM CQ - dosed in mg/24 hours) patch 21 mg, 21 mg, Transdermal, Daily, Harrie Foreman, MD, 21 mg at 08/16/19 2018   octreotide (SANDOSTATIN) injection 100 mcg, 100 mcg, Subcutaneous, Q12H, Piscoya, Jose, MD, 100 mcg at 08/17/19 1530   ondansetron (ZOFRAN) tablet 4 mg, 4 mg, Oral, Q6H PRN **OR** ondansetron (ZOFRAN) injection 4 mg, 4 mg, Intravenous, Q6H PRN, Harrie Foreman, MD, 4 mg  at 08/13/19 0353   pantoprazole (PROTONIX) EC tablet 40 mg, 40 mg, Oral, Daily, Mayo, Pete Pelt, MD, 40 mg at 08/17/19 1008   polyethylene glycol (MIRALAX / GLYCOLAX) packet 17 g, 17 g, Oral, Daily, Tylene Fantasia, PA-C, 17 g at 08/17/19 1010   prochlorperazine (COMPAZINE) injection 10 mg, 10 mg, Intravenous, Q6H PRN, Harrie Foreman, MD   senna-docusate (Senokot-S) tablet 2 tablet, 2 tablet, Oral, Daily, Harrie Foreman, MD, 2 tablet at 08/17/19 1010   Physical exam:  Vitals:   08/17/19 0423 08/17/19 0800 08/17/19 1008 08/17/19 1426  BP: 135/78 127/76 138/80 136/83  Pulse: 84 91 76 74  Resp: 16 20  18   Temp: 97.9 F (36.6 C) 97.8  F (36.6 C)  (!) 97.4 F (36.3 C)  TempSrc: Oral Oral  Oral  SpO2: 95% 96%  97%  Weight:       Physical Exam  Constitutional: He is oriented to person, place, and time. No distress.  Cachectic  HENT:  Head: Normocephalic and atraumatic.  Nose: Nose normal.  Mouth/Throat: Oropharynx is clear and moist. No oropharyngeal exudate.  Eyes: Pupils are equal, round, and reactive to light. EOM are normal. No scleral icterus.  Neck: Normal range of motion. Neck supple.  Cardiovascular: Normal rate and regular rhythm.  No murmur heard. Pulmonary/Chest: Effort normal. No respiratory distress. He has no rales. He exhibits no tenderness.  Abdominal: Soft. He exhibits distension. There is no abdominal tenderness.  Sluggish bowel sounds  Nephrostomy tube  Musculoskeletal: Normal range of motion.        General: No edema.  Lymphadenopathy:    He has cervical adenopathy.  Neurological: He is alert and oriented to person, place, and time. No cranial nerve deficit. He exhibits normal muscle tone. Coordination normal.  Skin: Skin is warm and dry. He is not diaphoretic. No erythema.  Psychiatric: Affect normal.      CMP Latest Ref Rng & Units 08/14/2019  Glucose 70 - 99 mg/dL 115(H)  BUN 8 - 23 mg/dL 23  Creatinine 0.61 - 1.24 mg/dL 0.72  Sodium  135 - 145 mmol/L 134(L)  Potassium 3.5 - 5.1 mmol/L 3.8  Chloride 98 - 111 mmol/L 104  CO2 22 - 32 mmol/L 22  Calcium 8.9 - 10.3 mg/dL 8.2(L)  Total Protein 6.5 - 8.1 g/dL -  Total Bilirubin 0.3 - 1.2 mg/dL -  Alkaline Phos 38 - 126 U/L -  AST 15 - 41 U/L -  ALT 0 - 44 U/L -   CBC Latest Ref Rng & Units 08/15/2019  WBC 4.0 - 10.5 K/uL 10.4  Hemoglobin 13.0 - 17.0 g/dL 13.6  Hematocrit 39.0 - 52.0 % 40.9  Platelets 150 - 400 K/uL 61(L)    RADIOGRAPHIC STUDIES: I have personally reviewed the radiological images as listed and agreed with the findings in the report. Dg Abd 1 View  Result Date: 08/16/2019 CLINICAL DATA:  Abdominal pain and distension EXAM: ABDOMEN - 1 VIEW COMPARISON:  08/10/2019 FINDINGS: Scattered large and small bowel gas is noted. Persistent small bowel dilatation is seen an increased when compared with the prior exam. Right percutaneous nephrostomy catheter is again noted. No free air is seen. No focal mass lesion is noted. No bony abnormality is noted. IMPRESSION: Increasing small bowel dilatation when compare with the prior exam consistent with a worsening obstruction. Electronically Signed   By: Inez Catalina M.D.   On: 08/16/2019 14:09   Dg Abd 1 View  Result Date: 08/10/2019 CLINICAL DATA:  68 year old male with a history of small bowel obstruction EXAM: ABDOMEN - 1 VIEW COMPARISON:  08/09/2019 FINDINGS: Similar appearance of the lower chest. Gastric tube terminates within the stomach. Unchanged appearance of the right percutaneous nephrostomy. Dilated small bowel loops within the mid abdomen. Paucity of colonic and rectal gas. Small formed stool within the rectum. No unexpected radiopaque foreign body. No displaced fracture IMPRESSION: Dilated small bowel loops indicating ongoing obstruction/ileus. Relative absence of colonic gas. Unchanged gastric tube and right percutaneous nephrostomy. Electronically Signed   By: Corrie Mckusick D.O.   On: 08/10/2019 08:09   Dg Abd  1 View  Result Date: 08/09/2019 CLINICAL DATA:  Small bowel obstruction EXAM: ABDOMEN - 1 VIEW COMPARISON:  08/07/2019  FINDINGS: NG tube tip is within the fundus of the stomach, unchanged. Right side nephrostomy catheter again noted, unchanged. There are dilated small bowel loops compatible with small bowel obstruction, slightly progressed since prior study. No organomegaly or free air. IMPRESSION: Small-bowel dilatation slightly progressed since prior study. Findings compatible with continued small bowel obstruction. Electronically Signed   By: Rolm Baptise M.D.   On: 08/09/2019 09:42   Ct Abdomen Pelvis W Contrast  Result Date: 08/07/2019 CLINICAL DATA:  Emesis for 2 days, history of bladder cancer EXAM: CT ABDOMEN AND PELVIS WITH CONTRAST TECHNIQUE: Multidetector CT imaging of the abdomen and pelvis was performed using the standard protocol following bolus administration of intravenous contrast. CONTRAST:  156mL OMNIPAQUE IOHEXOL 300 MG/ML  SOLN COMPARISON:  PET-CT, 07/25/2019 FINDINGS: Lower chest: No acute abnormality. Metastatic nodules in the partially included lingula (series 4, image 9). Emphysema. Hepatobiliary: No solid liver abnormality is seen. No gallstones, gallbladder wall thickening, or biliary dilatation. Pancreas: Unremarkable. No pancreatic ductal dilatation or surrounding inflammatory changes. Spleen: Normal in size without significant abnormality. Adrenals/Urinary Tract: Adrenal glands are unremarkable. Right-sided percutaneous nephrostomy with formed pigtail in the right renal pelvis. No hydronephrosis. Redemonstrated, bulky, predominantly endoluminal bladder mass and soft tissue nodules. Stomach/Bowel: There is redemonstrated, extensive, bulky, metastatic tissue involving the omentum, multiple loops of small bowel, and the anterior abdominal wall. The stomach and proximal to mid small bowel are diffusely fluid distended, with an abrupt transition point in the central abdomen abutting  metastatic soft tissue (series 2, image 59) and decompression of the distal small bowel. Vascular/Lymphatic: Aortic atherosclerosis. Multiple enlarged retroperitoneal, iliac, and pelvic sidewall lymph nodes as seen on prior examination. Reproductive: No mass or other significant abnormality. Other: No abdominal wall hernia or abnormality. No abdominopelvic ascites. Musculoskeletal: No acute or significant osseous findings. IMPRESSION: 1. There is redemonstrated, extensive, bulky, metastatic tissue involving the omentum, multiple loops of small bowel, and the anterior abdominal wall, not significant changed in appearance compared to prior PET-CT examination dated 07/25/2019. The stomach and proximal to mid small bowel are diffusely fluid distended, with an abrupt transition point in the central abdomen abutting metastatic soft tissue (series 2, image 59) and decompression of the distal small bowel. Findings are consistent with small bowel obstruction. 2. Other findings of primary bladder malignancy and advanced metastatic disease as detailed above, not significantly changed in comparison to PET-CT dated 07/25/2019. 3. Aortic Atherosclerosis (ICD10-I70.0) and Emphysema (ICD10-J43.9). Electronically Signed   By: Eddie Candle M.D.   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 EXAM: NUCLEAR MEDICINE PET SKULL BASE TO THIGH TECHNIQUE: 8.795 mCi F-18 FDG was injected intravenously. Full-ring PET imaging was performed from the skull base to thigh after the radiotracer. CT data was obtained and used for attenuation correction and anatomic localization. Fasting blood glucose: 93 mg/dl COMPARISON:  None. FINDINGS: Mediastinal blood pool activity: SUV max 1.36 . Liver activity: SUV max NA NECK: Enlarged and hypermetabolic left level 2 lymph nodes. Nodal mass measures 3.4 by 1.9 cm and has an SUV  max of 7.06 Incidental CT findings: none CHEST: Hypermetabolic right paratracheal, subcarinal and bilateral hilar lymph nodes are identified. Right paratracheal node measures 1.4 cm and has an SUV max of 10.38. Index subcarinal node measures 1.1 cm and has an SUV max of 8.93. Index left hilar node measures 1.2 cm and has an SUV max of 6.41.  Right hilar lymph node measures 1.2 cm and has an SUV max 8.66. Right CP angle lymph node is FDG avid measuring 1.4 cm with SUV max of 7.57. Bilateral FDG avid pulmonary nodules. Right lower lobe lung nodule measures 1 cm and has an SUV max of 1.88. Hypermetabolic subpleural nodule in the lingula measures 1 cm and has an SUV max of 6.6. Incidental CT findings: Advanced changes of emphysema. Aortic atherosclerosis. ABDOMEN/PELVIS: There is a few small foci of FDG uptake above liver background activity without definite CT correlate, equivocal for metastasis. For example, within segment 8 there is an area of increased uptake within SUV max of 2.67. This is compared with background liver activity of 2.15. No abnormal uptake within the pancreas, spleen or adrenal glands. Soft tissue mass involving the bladder measures 9 cm within SUV max of 15.7. Hypermetabolic right retrocrural lymph node measures 1 cm and has an SUV max of 7.13. Multiple hypermetabolic retroperitoneal and bilateral iliac and inguinal lymph nodes identified. Index left periaortic node measures 1.1 cm within SUV max of 8.01. Index retrocaval lymph node measures 1.3 cm and has an SUV max of 6.25. Left common iliac node measures 1.3 cm and has an SUV max of 1.2. Right common iliac node measures 1.5 cm and has an SUV max of 6.4. Left external iliac node measures 1.8 cm with SUV max of 8.79. Left inguinal node measures 0.7 cm and has an SUV max of 4.87. Left pericolic gutter soft tissue mass measures 12.4 cm and has SUV max of 11.9. Right lateral abdominal mass measures 8 cm and has SUV max of 12.07. Large mass within the  ventral abdomen measures 16.6 cm within SUV max of 11.36. Small FDG avid peritoneal implants are identified along the surface of the liver. Incidental CT findings: Right-sided percutaneous nephrostomy tube is in place. No hydronephrosis identified bilaterally. Aortic atherosclerosis. SKELETON: No focal hypermetabolic activity to suggest skeletal metastasis. Incidental CT findings: none IMPRESSION: 1. Large mass within the urinary bladder is identified and is hypermetabolic concerning for primary urothelial neoplasm. 2. Hypermetabolic left cervical, thoracic, abdominal, and pelvic lymph nodes consistent with metastatic adenopathy. 3. Extensive, bulky, hypermetabolic peritoneal disease within the abdomen and pelvis. 4. Bilateral pulmonary nodules concerning for metastatic disease. 5. A few small foci (without CT correlate) of mild FDG uptake are noted in the liver which are equivocal for metastasis. 6. No findings to suggest osseous metastasis. 7. Aortic Atherosclerosis (ICD10-I70.0) and Emphysema (ICD10-J43.9). Electronically Signed   By: Kerby Moors M.D.   On: 07/25/2019 13:30   Dg Chest Portable 1 View  Result Date: 08/07/2019 CLINICAL DATA:  Elevated white count, emesis for 3 days. EXAM: PORTABLE CHEST 1 VIEW COMPARISON:  Chest radiograph dated 07/21/2019 FINDINGS: The heart size and mediastinal contours are within normal limits. Emphysematous changes are noted. Both lungs are clear. The visualized skeletal structures are unremarkable. A left subclavian approach central venous port catheter tip overlies the superior vena cava. A pigtail catheter overlies the right upper quadrant. IMPRESSION: No acute cardiopulmonary findings. Emphysema (ICD10-J43.9). Electronically Signed   By: Zerita Boers M.D.   On: 08/07/2019 15:17   Dg Chest Port 1 View  Result Date: 07/21/2019 CLINICAL DATA:  Port placement.  Metastatic bladder cancer. EXAM: PORTABLE CHEST 1 VIEW COMPARISON:  Chest x-ray dated Feb 24, 2014.  FINDINGS: New left chest wall port catheter with tip in the mid SVC. The heart size and mediastinal contours are within normal limits. Chronically coarsened interstitial markings with emphysematous changes.  Small area of nodularity at the left lung base. No focal consolidation, pleural effusion, or pneumothorax. No acute osseous abnormality. IMPRESSION: 1. New appropriately positioned left chest wall port catheter without complicating feature. 2. Small area of nodularity at the left lung base. Metastatic disease is not excluded. Electronically Signed   By: Titus Dubin M.D.   On: 07/21/2019 19:39   Dg Abd Portable 1 View  Result Date: 08/07/2019 CLINICAL DATA:  NG tube placement EXAM: PORTABLE ABDOMEN - 1 VIEW COMPARISON:  CT abdomen/pelvis 08/07/2019 FINDINGS: Nasogastric tube with the tip projecting over the stomach. There is ill-defined dilatation of the small bowel consistent with bowel obstruction. There is no evidence of pneumoperitoneum, portal venous gas or pneumatosis. There are no pathologic calcifications along the expected course of the ureters. Right percutaneous nephrostomy tube. The osseous structures are unremarkable. IMPRESSION: 1. Nasogastric tube with the tip projecting over the stomach. Electronically Signed   By: Kathreen Devoid   On: 08/07/2019 17:39   Dg C-arm 1-60 Min-no Report  Result Date: 07/21/2019 Fluoroscopy was utilized by the requesting physician.  No radiographic interpretation.    Assessment and plan-  68 year old male metastatic bladder cancer, not yet started on chemotherapy, current admitted due to SBO.  #Small bowel obstruction in the setting of widely metastatic bladder cancer with peritoneal carcinomatosis, no intervention per surgery given peritoneal carcinomatosis. KUB showed more distension.  Symptomatically improved today.  Continue octreotide 0.1mg  subQ Q12h, Reglan 10mg  Q8 hours, taper Dexamethasone to 4mg  BID.  Clear liquid diet.    #Goals of care  discussion, had a lengthy discussion with patient's daughter [name] today.  Prognosis is poor.  Patient and daughter both agree on being converted to inpatient hospice, while waiting for bed at hospice facility.  #Severe protein calorie malnutrition.  Continue boost nutrition supplementation. Discussed with Dr.Patel in person.  Updated rest of medical team via secure chat.   CODE STATUS, DNR/DNI. We spent sufficient time to discuss many aspect of care, questions were answered to patient's satisfaction. Total face to face encounter time for this patient visit was 35 min. >50% of the time was  spent in counseling and coordination of care.   Earlie Server, MD, PhD Hematology Oncology [facility] Pager- [phone] 08/17/2019

## 2019-08-17 NOTE — Progress Notes (Signed)
[place]  Telephone:([phone] [phone]   Name: Johnny Martin Date: 08/17/2019 MRN: [id number]  DOB: 05/29/1951  Patient Care Team: Earlie Server, MD as PCP - General (Oncology)    REASON FOR CONSULTATION: Palliative Care consult requested for this68 y.o.malewith multiple medical problems including history of bladder cancer lost to follow-up, who was recently hospitalized 07/11/2019-07/14/2019 with hematuria.Work-up revealed right-sided hydronephrosis requiring a right nephrostomy tube. 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 Subsequent PET scan on 07/25/2019 shows metastatic disease to bladder, with widespread lymph involvement, bilateral pulmonary nodules, liver, and extensive hypermetabolic peritoneal disease. He also was noted to have a left neck mass concerning for malignancy.  Patient was admitted to the hospital on 08/07/2019 with intractable nausea and vomiting.  He was found to have a small bowel obstruction per abdominal CT.  Patient has not felt to be a surgical candidate and is being treated conservatively with gastric decompression via NGT.Patient was referred to palliative care to help address goals and manage ongoing symptoms.   CODE STATUS: DNR  PAST MEDICAL HISTORY: Past Medical History:  Diagnosis Date   Bladder cancer ([place]) 07/21/2019   Cancer ([facility])    Bladder cancer    PAST SURGICAL HISTORY:  Past Surgical History:  Procedure Laterality Date   BLADDER SURGERY     BRAIN SURGERY     plate in head per patient since childhood    PORTACATH PLACEMENT Left 07/21/2019   Procedure: INSERTION PORT-A-CATH;  Surgeon: Olean Ree, MD;  Location: [facility] ORS;  Service: General;  Laterality: Left;    HEMATOLOGY/ONCOLOGY HISTORY:  Oncology History  Bladder cancer ([place])  07/21/2019 Initial Diagnosis   Bladder cancer ([place])   07/27/2019 - 07/27/2019  Chemotherapy   The patient had PALONOSETRON HCL INJECTION 0.25 MG/5ML, 0.25 mg, Intravenous,  Once, 0 of 4 cycles CARBOplatin (PARAPLATIN) in sodium chloride 0.9 % 100 mL chemo infusion, , Intravenous,  Once, 0 of 4 cycles gemcitabine (GEMZAR) 1,900 mg in sodium chloride 0.9 % 250 mL chemo infusion, 1,000 mg/m2, Intravenous,  Once, 0 of 4 cycles FOSAPREPITANT 150MG  + DEXAMETHASONE INFUSION [facility], , Intravenous,  Once, 0 of 4 cycles  for chemotherapy treatment.    08/03/2019 - 08/03/2019 Chemotherapy   The patient had palonosetron (ALOXI) injection 0.25 mg, 0.25 mg, Intravenous,  Once, 0 of 6 cycles CISplatin (PLATINOL) 133 mg in sodium chloride 0.9 % 500 mL chemo infusion, 70 mg/m2, Intravenous,  Once, 0 of 6 cycles gemcitabine (GEMZAR) 1,900 mg in sodium chloride 0.9 % 250 mL chemo infusion, 1,000 mg/m2, Intravenous,  Once, 0 of 6 cycles fosaprepitant (EMEND) 150 mg, dexamethasone (DECADRON) 12 mg in sodium chloride 0.9 % 145 mL IVPB, , Intravenous,  Once, 0 of 6 cycles  for chemotherapy treatment.    08/10/2019 -  Chemotherapy   The patient had PALONOSETRON HCL INJECTION 0.25 MG/5ML, 0.25 mg, Intravenous,  Once, 0 of 4 cycles CARBOplatin (PARAPLATIN) in sodium chloride 0.9 % 100 mL chemo infusion, , Intravenous,  Once, 0 of 4 cycles gemcitabine (GEMZAR) 1,900 mg in sodium chloride 0.9 % 250 mL chemo infusion, 1,000 mg/m2, Intravenous,  Once, 0 of 4 cycles FOSAPREPITANT 150MG  + DEXAMETHASONE INFUSION [facility], , Intravenous,  Once, 0 of 4 cycles  for chemotherapy treatment.      ALLERGIES:  has No Known Allergies.  MEDICATIONS:  Current Facility-Administered Medications  Medication Dose Route Frequency Provider Last Rate Last Dose   0.9 %  sodium chloride infusion   Intravenous Continuous Monia Sabal, PA-C       acetaminophen (TYLENOL) tablet 650 mg  650 mg Oral Q6H PRN Harrie Foreman, MD   650 mg at 08/16/19 2350   Or   acetaminophen (TYLENOL) suppository 650 mg  650 mg Rectal  Q6H PRN Harrie Foreman, MD       Chlorhexidine Gluconate Cloth 2 % PADS 6 each  6 each Topical Daily Harrie Foreman, MD   6 each at 08/17/19 1010   dexamethasone (DECADRON) injection 4 mg  4 mg Intravenous Marlou Starks, MD   4 mg at 08/17/19 1010   docusate sodium (COLACE) capsule 100 mg  100 mg Oral BID Harrie Foreman, MD   100 mg at 08/17/19 1010   feeding supplement (BOOST / RESOURCE BREEZE) liquid 1 Container  1 Container Oral TID BM Tylene Fantasia, PA-C   1 Container at 08/17/19 1010   HYDROmorphone (DILAUDID) injection 0.5 mg  0.5 mg Intravenous Q3H PRN Sela Hua, MD   0.5 mg at 08/17/19 1318   lidocaine-EPINEPHrine (XYLOCAINE W/EPI) 1 %-1:100000 (with pres) injection 10 mL  10 mL Intradermal Once Mayo, Pete Pelt, MD       metoCLOPramide (REGLAN) injection 10 mg  10 mg Intravenous Girtha Rm, MD   10 mg at 08/17/19 0520   metoprolol tartrate (LOPRESSOR) tablet 25 mg  25 mg Oral BID Sela Hua, MD   25 mg at 08/17/19 1009   nicotine (NICODERM CQ - dosed in mg/24 hours) patch 21 mg  21 mg Transdermal Daily Harrie Foreman, MD   21 mg at 08/16/19 2018   octreotide (SANDOSTATIN) injection 100 mcg  100 mcg Subcutaneous Q12H Piscoya, Jose, MD   100 mcg at 08/16/19 2350   ondansetron (ZOFRAN) tablet 4 mg  4 mg Oral Q6H PRN Harrie Foreman, MD       Or   ondansetron [facility]) injection 4 mg  4 mg Intravenous Q6H PRN Harrie Foreman, MD   4 mg at 08/13/19 0353   pantoprazole (PROTONIX) EC tablet 40 mg  40 mg Oral Daily Mayo, Pete Pelt, MD   40 mg at 08/17/19 1008   polyethylene glycol (MIRALAX / GLYCOLAX) packet 17 g  17 g Oral Daily Tylene Fantasia, PA-C   17 g at 08/17/19 1010   prochlorperazine (COMPAZINE) injection 10 mg  10 mg Intravenous Q6H PRN Harrie Foreman, MD       senna-docusate (Senokot-S) tablet 2 tablet  2 tablet Oral Daily Harrie Foreman, MD   2 tablet at 08/17/19 1010    VITAL SIGNS: BP 136/83 (BP Location: Left Arm)     Pulse 74    Temp (!) 97.4 F (36.3 C) (Oral)    Resp 18    Wt 159 lb 9.8 oz (72.4 kg)    SpO2 97%    BMI 21.06 kg/m  Filed Weights   08/09/19 0500 08/13/19 0354 08/14/19 0318  Weight: 152 lb 1.9 oz (69 kg) 161 lb 9.6 oz (73.3 kg) 159 lb 9.8 oz (72.4 kg)    Estimated body mass index is 21.06 kg/m as calculated from the following:   Height as of 08/02/19: 6\' 1"  (1.854 m).   Weight as of this encounter: 159 lb 9.8 oz (72.4 kg).  LABS: CBC:    Component Value Date/Time   WBC 10.4 08/15/2019 0440   HGB 13.6 08/15/2019 0440   HGB 8.7 (L) 03/02/2014  0948   HCT 40.9 08/15/2019 0440   HCT 26.2 (L) 03/02/2014 0948   PLT 61 (L) 08/15/2019 0440   PLT 136 (L) 03/02/2014 0948   MCV 87.0 08/15/2019 0440   MCV 88 03/02/2014 0948   NEUTROABS 8.4 (H) 08/15/2019 0440   NEUTROABS 12.4 (H) 03/02/2014 0948   LYMPHSABS 1.1 08/15/2019 0440   LYMPHSABS 1.4 03/02/2014 0948   MONOABS 0.7 08/15/2019 0440   MONOABS 0.5 03/02/2014 0948   EOSABS 0.0 08/15/2019 0440   EOSABS 0.3 03/02/2014 0948   BASOSABS 0.0 08/15/2019 0440   BASOSABS 0.0 03/02/2014 0948   Comprehensive Metabolic Panel:    Component Value Date/Time   NA 134 (L) 08/14/2019 0542   NA 138 02/26/2014 0307   K 3.8 08/14/2019 0542   K 3.8 02/26/2014 0307   CL 104 08/14/2019 0542   CL 107 02/26/2014 0307   CO2 22 08/14/2019 0542   CO2 27 02/26/2014 0307   BUN 23 08/14/2019 0542   BUN 17 02/26/2014 0307   CREATININE 0.72 08/14/2019 0542   CREATININE 1.08 02/26/2014 0307   GLUCOSE 115 (H) 08/14/2019 0542   GLUCOSE 89 02/26/2014 0307   CALCIUM 8.2 (L) 08/14/2019 0542   CALCIUM 7.4 (L) 02/26/2014 0307   AST 32 08/07/2019 1115   AST 20 02/23/2014 2141   ALT 18 08/07/2019 1115   ALT 18 02/23/2014 2141   ALKPHOS 143 (H) 08/07/2019 1115   ALKPHOS 49 02/23/2014 2141   BILITOT 1.4 (H) 08/07/2019 1115   BILITOT 0.3 02/23/2014 2141   PROT 7.4 08/07/2019 1115   PROT 5.1 (L) 02/23/2014 2141   ALBUMIN 3.7 08/07/2019 1115   ALBUMIN 2.6  (L) 02/23/2014 2141    RADIOGRAPHIC STUDIES: Dg Abd 1 View  Result Date: 08/16/2019 CLINICAL DATA:  Abdominal pain and distension EXAM: ABDOMEN - 1 VIEW COMPARISON:  08/10/2019 FINDINGS: Scattered large and small bowel gas is noted. Persistent small bowel dilatation is seen an increased when compared with the prior exam. Right percutaneous nephrostomy catheter is again noted. No free air is seen. No focal mass lesion is noted. No bony abnormality is noted. IMPRESSION: Increasing small bowel dilatation when compare with the prior exam consistent with a worsening obstruction. Electronically Signed   By: Inez Catalina M.D.   On: 08/16/2019 14:09   Dg Abd 1 View  Result Date: 08/10/2019 CLINICAL DATA:  67 year old male with a history of small bowel obstruction EXAM: ABDOMEN - 1 VIEW COMPARISON:  08/09/2019 FINDINGS: Similar appearance of the lower chest. Gastric tube terminates within the stomach. Unchanged appearance of the right percutaneous nephrostomy. Dilated small bowel loops within the mid abdomen. Paucity of colonic and rectal gas. Small formed stool within the rectum. No unexpected radiopaque foreign body. No displaced fracture IMPRESSION: Dilated small bowel loops indicating ongoing obstruction/ileus. Relative absence of colonic gas. Unchanged gastric tube and right percutaneous nephrostomy. Electronically Signed   By: Corrie Mckusick D.O.   On: 08/10/2019 08:09   Dg Abd 1 View  Result Date: 08/09/2019 CLINICAL DATA:  Small bowel obstruction EXAM: ABDOMEN - 1 VIEW COMPARISON:  08/07/2019 FINDINGS: NG tube tip is within the fundus of the stomach, unchanged. Right side nephrostomy catheter again noted, unchanged. There are dilated small bowel loops compatible with small bowel obstruction, slightly progressed since prior study. No organomegaly or free air. IMPRESSION: Small-bowel dilatation slightly progressed since prior study. Findings compatible with continued small bowel obstruction.  Electronically Signed   By: Rolm Baptise M.D.   On: 08/09/2019 09:42  Ct Abdomen Pelvis W Contrast  Result Date: 08/07/2019 CLINICAL DATA:  Emesis for 2 days, history of bladder cancer EXAM: CT ABDOMEN AND PELVIS WITH CONTRAST TECHNIQUE: Multidetector CT imaging of the abdomen and pelvis was performed using the standard protocol following bolus administration of intravenous contrast. CONTRAST:  153mL OMNIPAQUE IOHEXOL 300 MG/ML  SOLN COMPARISON:  PET-CT, 07/25/2019 FINDINGS: Lower chest: No acute abnormality. Metastatic nodules in the partially included lingula (series 4, image 9). Emphysema. Hepatobiliary: No solid liver abnormality is seen. No gallstones, gallbladder wall thickening, or biliary dilatation. Pancreas: Unremarkable. No pancreatic ductal dilatation or surrounding inflammatory changes. Spleen: Normal in size without significant abnormality. Adrenals/Urinary Tract: Adrenal glands are unremarkable. Right-sided percutaneous nephrostomy with formed pigtail in the right renal pelvis. No hydronephrosis. Redemonstrated, bulky, predominantly endoluminal bladder mass and soft tissue nodules. Stomach/Bowel: There is redemonstrated, extensive, bulky, metastatic tissue involving the omentum, multiple loops of small bowel, and the anterior abdominal wall. The stomach and proximal to mid small bowel are diffusely fluid distended, with an abrupt transition point in the central abdomen abutting metastatic soft tissue (series 2, image 59) and decompression of the distal small bowel. Vascular/Lymphatic: Aortic atherosclerosis. Multiple enlarged retroperitoneal, iliac, and pelvic sidewall lymph nodes as seen on prior examination. Reproductive: No mass or other significant abnormality. Other: No abdominal wall hernia or abnormality. No abdominopelvic ascites. Musculoskeletal: No acute or significant osseous findings. IMPRESSION: 1. There is redemonstrated, extensive, bulky, metastatic tissue involving the omentum,  multiple loops of small bowel, and the anterior abdominal wall, not significant changed in appearance compared to prior PET-CT examination dated 07/25/2019. The stomach and proximal to mid small bowel are diffusely fluid distended, with an abrupt transition point in the central abdomen abutting metastatic soft tissue (series 2, image 59) and decompression of the distal small bowel. Findings are consistent with small bowel obstruction. 2. Other findings of primary bladder malignancy and advanced metastatic disease as detailed above, not significantly changed in comparison to PET-CT dated 07/25/2019. 3. Aortic Atherosclerosis (ICD10-I70.0) and Emphysema (ICD10-J43.9). Electronically Signed   By: Eddie Candle M.D.   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 EXAM: NUCLEAR MEDICINE PET SKULL BASE TO THIGH TECHNIQUE: 8.795 mCi F-18 FDG was injected intravenously. Full-ring PET imaging was performed from the skull base to thigh after the radiotracer. CT data was obtained and used for attenuation correction and anatomic localization. Fasting blood glucose: 93 mg/dl COMPARISON:  None. FINDINGS: Mediastinal blood pool activity: SUV max 1.36 . Liver activity: SUV max NA NECK: Enlarged and hypermetabolic left level 2 lymph nodes. Nodal mass measures 3.4 by 1.9 cm and has an SUV max of 7.06 Incidental CT findings: none CHEST: Hypermetabolic right paratracheal, subcarinal and bilateral hilar lymph nodes are identified. Right paratracheal node measures 1.4 cm and has an SUV max of 10.38. Index subcarinal node measures 1.1 cm and has an SUV max of 8.93. Index left hilar node measures 1.2 cm and has an SUV max of 6.41. Right hilar lymph node measures 1.2 cm and has an SUV max 8.66. Right CP angle lymph node is FDG avid measuring 1.4 cm with SUV max of 7.57. Bilateral FDG avid pulmonary nodules.  Right lower lobe lung nodule measures 1 cm and has an SUV max of 1.88. Hypermetabolic subpleural nodule in the lingula measures 1 cm and has an SUV max of 6.6. Incidental CT findings: Advanced changes of  emphysema. Aortic atherosclerosis. ABDOMEN/PELVIS: There is a few small foci of FDG uptake above liver background activity without definite CT correlate, equivocal for metastasis. For example, within segment 8 there is an area of increased uptake within SUV max of 2.67. This is compared with background liver activity of 2.15. No abnormal uptake within the pancreas, spleen or adrenal glands. Soft tissue mass involving the bladder measures 9 cm within SUV max of 15.7. Hypermetabolic right retrocrural lymph node measures 1 cm and has an SUV max of 7.13. Multiple hypermetabolic retroperitoneal and bilateral iliac and inguinal lymph nodes identified. Index left periaortic node measures 1.1 cm within SUV max of 8.01. Index retrocaval lymph node measures 1.3 cm and has an SUV max of 6.25. Left common iliac node measures 1.3 cm and has an SUV max of 1.2. Right common iliac node measures 1.5 cm and has an SUV max of 6.4. Left external iliac node measures 1.8 cm with SUV max of 8.79. Left inguinal node measures 0.7 cm and has an SUV max of 4.87. Left pericolic gutter soft tissue mass measures 12.4 cm and has SUV max of 11.9. Right lateral abdominal mass measures 8 cm and has SUV max of 12.07. Large mass within the ventral abdomen measures 16.6 cm within SUV max of 11.36. Small FDG avid peritoneal implants are identified along the surface of the liver. Incidental CT findings: Right-sided percutaneous nephrostomy tube is in place. No hydronephrosis identified bilaterally. Aortic atherosclerosis. SKELETON: No focal hypermetabolic activity to suggest skeletal metastasis. Incidental CT findings: none IMPRESSION: 1. Large mass within the urinary bladder is identified and is hypermetabolic concerning for primary urothelial neoplasm.  2. Hypermetabolic left cervical, thoracic, abdominal, and pelvic lymph nodes consistent with metastatic adenopathy. 3. Extensive, bulky, hypermetabolic peritoneal disease within the abdomen and pelvis. 4. Bilateral pulmonary nodules concerning for metastatic disease. 5. A few small foci (without CT correlate) of mild FDG uptake are noted in the liver which are equivocal for metastasis. 6. No findings to suggest osseous metastasis. 7. Aortic Atherosclerosis (ICD10-I70.0) and Emphysema (ICD10-J43.9). Electronically Signed   By: Kerby Moors M.D.   On: 07/25/2019 13:30   Dg Chest Portable 1 View  Result Date: 08/07/2019 CLINICAL DATA:  Elevated white count, emesis for 3 days. EXAM: PORTABLE CHEST 1 VIEW COMPARISON:  Chest radiograph dated 07/21/2019 FINDINGS: The heart size and mediastinal contours are within normal limits. Emphysematous changes are noted. Both lungs are clear. The visualized skeletal structures are unremarkable. A left subclavian approach central venous port catheter tip overlies the superior vena cava. A pigtail catheter overlies the right upper quadrant. IMPRESSION: No acute cardiopulmonary findings. Emphysema (ICD10-J43.9). Electronically Signed   By: Zerita Boers M.D.   On: 08/07/2019 15:17   Dg Chest Port 1 View  Result Date: 07/21/2019 CLINICAL DATA:  Port placement.  Metastatic bladder cancer. EXAM: PORTABLE CHEST 1 VIEW COMPARISON:  Chest x-ray dated Feb 24, 2014. FINDINGS: New left chest wall port catheter with tip in the mid SVC. The heart size and mediastinal contours are within normal limits. Chronically coarsened interstitial markings with emphysematous changes. Small area of nodularity at the left lung base. No focal consolidation, pleural effusion, or pneumothorax. No acute osseous abnormality. IMPRESSION: 1. New appropriately positioned left chest wall port catheter without complicating feature. 2. Small area of nodularity at the left lung base. Metastatic disease is not  excluded. Electronically Signed   By: Titus Dubin M.D.   On: 07/21/2019 19:39   Dg Abd Portable 1 View  Result  Date: 08/07/2019 CLINICAL DATA:  NG tube placement EXAM: PORTABLE ABDOMEN - 1 VIEW COMPARISON:  CT abdomen/pelvis 08/07/2019 FINDINGS: Nasogastric tube with the tip projecting over the stomach. There is ill-defined dilatation of the small bowel consistent with bowel obstruction. There is no evidence of pneumoperitoneum, portal venous gas or pneumatosis. There are no pathologic calcifications along the expected course of the ureters. Right percutaneous nephrostomy tube. The osseous structures are unremarkable. IMPRESSION: 1. Nasogastric tube with the tip projecting over the stomach. Electronically Signed   By: Kathreen Devoid   On: 08/07/2019 17:39   Dg C-arm 1-60 Min-no Report  Result Date: 07/21/2019 Fluoroscopy was utilized by the requesting physician.  No radiographic interpretation.    PERFORMANCE STATUS (ECOG) : 3 - Symptomatic, >50% confined to bed  Review of Systems Unless otherwise noted, a complete review of systems is negative.  Physical Exam General: NAD, frail appearing, thin Pulmonary: Unlabored Abdomen: soft, nontender, + bowel sounds GU: no suprapubic tenderness Extremities: no edema, no joint deformities Skin: no rashes Neurological: Weakness but otherwise nonfocal  IMPRESSION: Patient clinically unchanged today.  He is currently asymptomatic and overall appears comfortable.  He is having flatus but no BM.  KUB yesterday was consistent with worsening obstruction.  I worry that patient may develop intractable nausea and vomiting.  We discussed the idea of a venting PEG for palliation of symptoms.  However, patient does not seem interested in the idea at present.  Patient is in agreement with hospice involvement.  Case discussed with Dr. Tasia Catchings.  PLAN: -Best supportive care -Hospice referral    Time Total: 15 minutes  Visit consisted of counseling and  education dealing with the complex and emotionally intense issues of symptom management and palliative care in the setting of serious and potentially life-threatening illness.Greater than 50%  of this time was spent counseling and coordinating care related to the above assessment and plan.  Signed by: Altha Harm, PhD, NP-C

## 2019-08-17 NOTE — Progress Notes (Signed)
Johnny Martin daughter would like an update from MD. notified MD patel.

## 2019-08-18 MED ORDER — METOCLOPRAMIDE HCL 5 MG/5ML PO SOLN
10.0000 mg | Freq: Three times a day (TID) | ORAL | Status: DC
  Administered 2019-08-18 – 2019-08-19 (×2): 10 mg via ORAL
  Filled 2019-08-18 (×6): qty 10

## 2019-08-18 MED ORDER — OCTREOTIDE ACETATE 20 MG IM KIT: 20.0000 mg | PACK | Freq: Once | INTRAMUSCULAR | Status: DC

## 2019-08-18 NOTE — TOC Progression Note (Signed)
Transition of Care [facility]) - Progression Note    Patient Details  Name: Johnny Martin MRN: [id number] Date of Birth: 09-01-1951  [facility]) CM/SW Contact  Shelbie Hutching, RN Phone Number: 08/18/2019, 2:53 PM  Clinical Narrative:     Plan will be for patient to discharge to residential hospice home in [place] tomorrow.    Expected Discharge Plan: [place] Barriers to Discharge: Continued Medical Work up  Expected Discharge Plan and Services Expected Discharge Plan: [place] In-house Referral: Hospice / [facility], Nutrition Discharge Planning Services: CM Consult Post Acute Care Choice: Hospice Living arrangements for the past 2 months: Mobile Home                                       Social Determinants of Health (SDOH) Interventions    Readmission Risk Interventions No flowsheet data found.

## 2019-08-18 NOTE — Progress Notes (Signed)
Visit made to new referral for [facility] hospice home. Patient seen sitting up I bed, alert and oriented, reports he walked to the bathroom with the rolling walker and has been up to the chair. He continues on the full liquid diet. Daughter [name] at the bedside. Writer initiated education regarding hospice services, philosophy, team approach to care and current visitation policy, with understanding voiced. Writer also spoke via telephone to patient's daughter [name] as she has been the Nurse, adult. She would like to visit her father tomorrow at [facility] as she will not be able to see him once he transfers to the Hospice home d/t where she lives. Contact number given to [employer]. Plan is for discharge to the hospice home tomorrow afternoon, pending consents. Hospital care tem updated. [name] BSN, RN, [facility] [employer] [phone]

## 2019-08-18 NOTE — Progress Notes (Signed)
Physical Therapy Treatment Patient Details Name: Johnny Martin MRN: [id number] DOB: [birth year] Today's Date: 08/18/2019    History of Present Illness Pt is a 68 y/o male admitted for SBO on 10/25. Pt reported emesis for 3 days prior to admission. PMH includes metastatic bladder CA, which is expected to be the cause of the SBO.    PT Comments    The patient was in bed, agreeable to PT, expressed a desire to want to get up. Supine to sit with supervision and HOB elevated, sat EOB for several minutes to prep for transfer. Sit <> stand minA for weight shift and RW, CGA once in standing and to ambulate to chair. The patient declined further mobility, participated in seated therapeutic exercises. Current discharge recommendation remains appropriate.    Follow Up Recommendations  Home health PT     Equipment Recommendations  Rolling walker with 5" wheels    Recommendations for Other Services       Precautions / Restrictions Precautions Precautions: Fall Restrictions Weight Bearing Restrictions: No    Mobility  Bed Mobility Overal bed mobility: Needs Assistance Bed Mobility: Supine to Sit     Supine to sit: Supervision     General bed mobility comments: safe technique  Transfers Overall transfer level: Needs assistance Equipment used: Rolling walker (2 wheeled) Transfers: Sit to/from Stand Sit to Stand: Min assist         General transfer comment: assist for ant translation  Ambulation/Gait Ambulation/Gait assistance: Min guard Gait Distance (Feet): 4 Feet Assistive device: Rolling walker (2 wheeled)       General Gait Details: Pt transferred to recliner in room, declined further mobility   Stairs             Wheelchair Mobility    Modified Rankin (Stroke Patients Only)       Balance Overall balance assessment: Needs assistance Sitting-balance support: Feet supported Sitting balance-Leahy Scale: Good     Standing balance support: Bilateral  upper extremity supported Standing balance-Leahy Scale: Fair                              Cognition Arousal/Alertness: Awake/alert Behavior During Therapy: WFL for tasks assessed/performed Overall Cognitive Status: Within Functional Limits for tasks assessed                                 General Comments: A, O x 3      Exercises General Exercises - Lower Extremity Long Arc Quad: AROM;Strengthening;Both;10 reps Hip Flexion/Marching: AROM;Strengthening;Both;10 reps Toe Raises: AROM;Strengthening;Both;10 reps Heel Raises: AROM;Strengthening;Both;10 reps    General Comments        Pertinent Vitals/Pain Pain Assessment: No/denies pain    Home Living                      Prior Function            PT Goals (current goals can now be found in the care plan section) Progress towards PT goals: Progressing toward goals    Frequency    Min 2X/week      PT Plan Current plan remains appropriate    Co-evaluation              AM-PAC PT "6 Clicks" Mobility   Outcome Measure    Help needed moving from lying on your back to sitting on the side of  a flat bed without using bedrails?: None Help needed moving to and from a bed to a chair (including a wheelchair)?: A Little Help needed standing up from a chair using your arms (e.g., wheelchair or bedside chair)?: A Little Help needed to walk in hospital room?: A Little Help needed climbing 3-5 steps with a railing? : A Lot 6 Click Score: 15    End of Session Equipment Utilized During Treatment: Gait belt Activity Tolerance: Patient tolerated treatment well Patient left: in chair;with call bell/phone within reach;with chair alarm set Nurse Communication: Mobility status PT Visit Diagnosis: Unsteadiness on feet (R26.81);Muscle weakness (generalized) (M62.81)     Time: [id number] PT Time Calculation (min) (ACUTE ONLY): 15 min  Charges:  $Therapeutic Exercise: 8-22 mins                      Lieutenant Diego PT, DPT 10:25 AM,08/18/19 [phone]

## 2019-08-18 NOTE — Progress Notes (Signed)
[facility] [place] NAME: Johnny Martin    MR#:  [id number]  DATE OF BIRTH:  Apr 07, 1951  SUBJECTIVE:  patient feels better today. Tylenol is working for pain. He is tolerating full liquid diet. He had a few  bowel movement.  Pt is now comfort care.   REVIEW OF SYSTEMS:   Review of Systems  Constitutional: Positive for malaise/fatigue and weight loss. Negative for chills and fever.  HENT: Negative for ear discharge, ear pain and nosebleeds.   Eyes: Negative for blurred vision, pain and discharge.  Respiratory: Negative for sputum production, shortness of breath, wheezing and stridor.   Cardiovascular: Negative for chest pain, palpitations, orthopnea and PND.  Gastrointestinal: Negative for abdominal pain, diarrhea, nausea and vomiting.  Genitourinary: Negative for frequency and urgency.  Musculoskeletal: Negative for back pain and joint pain.  Neurological: Positive for weakness. Negative for sensory change, speech change and focal weakness.  Psychiatric/Behavioral: Negative for depression and hallucinations. The patient is not nervous/anxious.    Tolerating Diet:FLD Tolerating PT: ambulating by self  DRUG ALLERGIES:  No Known Allergies  VITALS:  Blood pressure 130/77, pulse 92, temperature 97.8 F (36.6 C), temperature source Oral, resp. rate 18, weight 73.5 kg, SpO2 96 %.  PHYSICAL EXAMINATION:   Physical Exam  GENERAL:  67 y.o.-year-old patient lying in the bed with no acute distress. Chronically ill EYES: Pupils equal, round, reactive to light and accommodation. No scleral icterus. Extraocular muscles intact.  HEENT: Head atraumatic, normocephalic. Oropharynx and nasopharynx clear.  NECK:  Supple, no jugular venous distention. No thyroid enlargement, no tenderness.  LUNGS: Normal breath sounds bilaterally, no wheezing, rales, rhonchi. No use of accessory muscles of respiration.  CARDIOVASCULAR: S1, S2 normal. No murmurs, rubs,  or gallops.  ABDOMEN: firm, nontender, ++distended. Few Bowel sounds present. No organomegaly or mass.  EXTREMITIES: No cyanosis, clubbing or edema b/l.    NEUROLOGIC: Cranial nerves II through XII are intact. No focal Motor or sensory deficits b/l.   PSYCHIATRIC:  patient is alert and oriented x 3.  SKIN: No obvious rash, lesion, or ulcer.   LABORATORY PANEL:  CBC Recent Labs  Lab 08/15/19 0440  WBC 10.4  HGB 13.6  HCT 40.9  PLT 61*    Chemistries  Recent Labs  Lab 08/14/19 0542  NA 134*  K 3.8  CL 104  CO2 22  GLUCOSE 115*  BUN 23  CREATININE 0.72  CALCIUM 8.2*   Cardiac Enzymes No results for input(s): TROPONINI in the last 168 hours. RADIOLOGY:  Dg Abd 1 View  Result Date: 08/16/2019 CLINICAL DATA:  Abdominal pain and distension EXAM: ABDOMEN - 1 VIEW COMPARISON:  08/10/2019 FINDINGS: Scattered large and small bowel gas is noted. Persistent small bowel dilatation is seen an increased when compared with the prior exam. Right percutaneous nephrostomy catheter is again noted. No free air is seen. No focal mass lesion is noted. No bony abnormality is noted. IMPRESSION: Increasing small bowel dilatation when compare with the prior exam consistent with a worsening obstruction. Electronically Signed   By: Inez Catalina M.D.   On: 08/16/2019 14:09   ASSESSMENT AND PLAN:   Small bowel obstruction secondary metastatic bladder cancer, peritoneal carcinomatosis.   -Not a surgical candidate secondary to extensive metastatic burden in abdomen.  Followed by general surgery.  - Treated with octreotide and metoclopramide--will change to oral reglan at d/c --No significant improvement, diet decreased, oncology is recommending KUB consideration of Gastrografin oral contrast.  Defer to oncology and general surgery. --Continue octreotide, Reglan and dexamethasone per oncology --Oncology as recommended supportive care, evaluating small bowel obstruction symptoms, transition to hospice and  comfort care.  Patient agreeable.  Palliative medicine following. -Dr Tasia Catchings d/w dter [name] --ok with comfort care and to hospice home tomorrow.   Thrombocytopenia --Likely due to consumption.  Oncology anticipates recovery in the next few days.  Status post port removal for skin erosion. --Continue local wound care.  Metastatic bladder cancer.  Prognosis is poor.  Palliative chemotherapy plus/minus immunotherapy delayed secondary to small bowel obstruction.  Status post nephrostomy tube on the right. --Poor prognosis.  Oncology has recommended hospice.  -Social worker to discuss with family regarding options of hospice  Acute kidney injury --Resolved.  Elevated TSH --No further evaluation suggested  Severe malnutrition in the context of chronic illness --Continue boost or Ensure as able  Case discussed with Care Management/Social Worker. Management plans discussed with the patient, family and they are in agreement.  CODE STATUS: dnr DVT Prophylaxis: scd  TOTAL TIME TAKING CARE OF THIS PATIENT: *30* minutes.  >50% time spent on counselling and coordination of care  discharged to hospice home tomorrow Note: This dictation was prepared with Dragon dictation along with smaller phrase technology. Any transcriptional errors that result from this process are unintentional.  Fritzi Mandes M.D on 08/18/2019 at 2:02 PM  Between 7am to 6pm - Pager - [phone]  After 6pm go to [email or web address] - password [email or web address]   CC: Primary care physician; Earlie Server, MDPatient ID: Johnny Martin, male   DOB: [birth year], 68 y.o.   MRN: [id number]

## 2019-08-18 NOTE — Progress Notes (Signed)
Hematology/Oncology Progress Note [facility] Telephone:([phone] [phone]  Patient Care Team: Earlie Server, MD as PCP - General (Oncology)   Name of the patient: Johnny Martin  [id number]  67-Aug-1952  Date of visit: 08/18/19   INTERVAL HISTORY-  Patient was seen at the bedside.  Passed small bowel movements.  Denies any abdominal pain. Denies any nausea or vomiting. On clear liquid Daughter is at the bedside.   Review of systems-  Review of Systems  Constitutional: Positive for appetite change, fatigue and unexpected weight change. Negative for chills and fever.  HENT:   Negative for hearing loss and voice change.   Eyes: Negative for eye problems and icterus.  Respiratory: Negative for chest tightness, cough, hemoptysis and shortness of breath.   Cardiovascular: Negative for chest pain and leg swelling.  Gastrointestinal: Positive for abdominal distention. Negative for abdominal pain.  Endocrine: Negative for hot flashes.  Genitourinary: Negative for difficulty urinating, dysuria and frequency.   Musculoskeletal: Negative for arthralgias.  Skin: Negative for itching and rash.  Neurological: Negative for light-headedness and numbness.  Hematological: Negative for adenopathy. Does not bruise/bleed easily.  Psychiatric/Behavioral: Negative for confusion. The patient is not nervous/anxious.      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)    Protein-calorie malnutrition, severe 08/08/2019   Non-intractable vomiting    SBO (small bowel obstruction) ([place]) 08/07/2019   Goals of care, counseling/discussion 07/21/2019   Bladder cancer ([place]) 07/21/2019   Drug-induced constipation    Bladder mass 07/11/2019   Hydronephrosis due to obstruction of ureter 07/11/2019     Gross hematuria    Peritoneal carcinomatosis (HCC)    Mass of left side of neck    AKI (acute kidney injury) ([place])      Past Medical History:  Diagnosis Date   Bladder cancer ([place]) 07/21/2019   Cancer ([place])    Bladder cancer     Past Surgical History:  Procedure Laterality Date   BLADDER SURGERY     BRAIN SURGERY     plate in head per patient since childhood    PORTACATH PLACEMENT Left 07/21/2019   Procedure: INSERTION PORT-A-CATH;  Surgeon: Olean Ree, MD;  Location: [facility] ORS;  Service: General;  Laterality: Left;    Social History   Socioeconomic History   Marital status: Divorced    Spouse name: Not on file   Number of children: Not on file   Years of education: Not on file   Highest education level: Not on file  Occupational History   Not on file  Social Needs   Financial resource strain: Not on file   Food insecurity    Worry: Not on file    Inability: Not on file   Transportation needs    Medical: Not on file    Non-medical: Not on file  Tobacco Use   Smoking status: Former Smoker    Packs/day: 0.50    Quit date: 07/13/2019    Years since quitting: 0.0   Smokeless tobacco: Never Used  Substance and Sexual Activity   Alcohol use: Not Currently    Frequency: Never    Comment: quit 3 years ago    Drug use: Never   Sexual activity: Not on file  Lifestyle   Physical activity    Days per week: Not  on file    Minutes per session: Not on file   Stress: Not on file  Relationships   Social connections    Talks on phone: Not on file    Gets together: Not on file    Attends religious service: Not on file    Active member of club or organization: Not on file    Attends meetings of clubs or organizations: Not on file    Relationship status: Not on file   Intimate partner violence    Fear of current or ex partner: Not on file    Emotionally abused: Not on file    Physically abused: Not on file    Forced sexual activity: Not on  file  Other Topics Concern   Not on file  Social History Narrative   Lives at home with daughter and son in [employer].     Family History  Problem Relation Age of Onset   Cervical cancer Sister    Kidney failure Mother    Stroke Mother    Alzheimer's disease Father      Current Facility-Administered Medications:    0.9 %  sodium chloride infusion, , Intravenous, Continuous, Monia Sabal, PA-C   acetaminophen (TYLENOL) tablet 650 mg, 650 mg, Oral, Q6H PRN, 650 mg at 08/18/19 1031 **OR** acetaminophen (TYLENOL) suppository 650 mg, 650 mg, Rectal, Q6H PRN, Harrie Foreman, MD   Chlorhexidine Gluconate Cloth 2 % PADS 6 each, 6 each, Topical, Daily, Harrie Foreman, MD, 6 each at 08/18/19 [id number]   docusate sodium (COLACE) capsule 100 mg, 100 mg, Oral, BID, Harrie Foreman, MD, 100 mg at 08/17/19 2146   feeding supplement (BOOST / RESOURCE BREEZE) liquid 1 Container, 1 Container, Oral, TID BM, Tylene Fantasia, PA-C, 1 Container at 08/18/19 [id number]   HYDROmorphone (DILAUDID) injection 0.5 mg, 0.5 mg, Intravenous, Q3H PRN, Mayo, Pete Pelt, MD, 0.5 mg at 08/18/19 1210   lidocaine-EPINEPHrine (XYLOCAINE W/EPI) 1 %-1:100000 (with pres) injection 10 mL, 10 mL, Intradermal, Once, Mayo, Pete Pelt, MD   metoCLOPramide (REGLAN) 5 MG/5ML solution 10 mg, 10 mg, Oral, TID AC & HS, Fritzi Mandes, MD   nicotine (NICODERM CQ - dosed in mg/24 hours) patch 21 mg, 21 mg, Transdermal, Daily, Harrie Foreman, MD, 21 mg at 08/17/19 2147   ondansetron (ZOFRAN) tablet 4 mg, 4 mg, Oral, Q6H PRN **OR** ondansetron (ZOFRAN) injection 4 mg, 4 mg, Intravenous, Q6H PRN, Harrie Foreman, MD, 4 mg at 08/13/19 0353   polyethylene glycol (MIRALAX / GLYCOLAX) packet 17 g, 17 g, Oral, Daily, Tylene Fantasia, PA-C, 17 g at 08/18/19 [id number]   prochlorperazine (COMPAZINE) injection 10 mg, 10 mg, Intravenous, Q6H PRN, Harrie Foreman, MD   senna-docusate (Senokot-S) tablet 2 tablet, 2 tablet, Oral, Daily,  Harrie Foreman, MD, 2 tablet at 08/18/19 [id number]   Physical exam:  Vitals:   08/17/19 1929 08/18/19 0455 08/18/19 0600 08/18/19 0830  BP: 137/85 118/68  130/77  Pulse: 93 82  92  Resp: 16 16  18   Temp: 97.8 F (36.6 C) 97.8 F (36.6 C)  97.8 F (36.6 C)  TempSrc: Oral Oral  Oral  SpO2: 96% 96%  96%  Weight:   162 lb (73.5 kg)    Physical Exam  Constitutional: He is oriented to person, place, and time. No distress.  Cachectic  HENT:  Head: Normocephalic and atraumatic.  Nose: Nose normal.  Mouth/Throat: Oropharynx is clear and moist. No oropharyngeal exudate.  Eyes: Pupils are equal, round, and  reactive to light. EOM are normal. No scleral icterus.  Neck: Normal range of motion. Neck supple.  Cardiovascular: Normal rate and regular rhythm.  No murmur heard. Pulmonary/Chest: Effort normal. No respiratory distress. He has no rales. He exhibits no tenderness.  Abdominal: Soft. He exhibits distension. There is no abdominal tenderness.  Sluggish bowel sounds  Nephrostomy tube  Musculoskeletal: Normal range of motion.        General: No edema.  Lymphadenopathy:    He has cervical adenopathy.  Neurological: He is alert and oriented to person, place, and time. No cranial nerve deficit. He exhibits normal muscle tone. Coordination normal.  Skin: Skin is warm and dry. He is not diaphoretic. No erythema.  Psychiatric: Affect normal.      CMP Latest Ref Rng & Units 08/14/2019  Glucose 70 - 99 mg/dL 115(H)  BUN 8 - 23 mg/dL 23  Creatinine 0.61 - 1.24 mg/dL 0.72  Sodium 135 - 145 mmol/L 134(L)  Potassium 3.5 - 5.1 mmol/L 3.8  Chloride 98 - 111 mmol/L 104  CO2 22 - 32 mmol/L 22  Calcium 8.9 - 10.3 mg/dL 8.2(L)  Total Protein 6.5 - 8.1 g/dL -  Total Bilirubin 0.3 - 1.2 mg/dL -  Alkaline Phos 38 - 126 U/L -  AST 15 - 41 U/L -  ALT 0 - 44 U/L -   CBC Latest Ref Rng & Units 08/15/2019  WBC 4.0 - 10.5 K/uL 10.4  Hemoglobin 13.0 - 17.0 g/dL 13.6  Hematocrit 39.0 - 52.0 % 40.9    Platelets 150 - 400 K/uL 61(L)    RADIOGRAPHIC STUDIES: I have personally reviewed the radiological images as listed and agreed with the findings in the report. Dg Abd 1 View  Result Date: 08/16/2019 CLINICAL DATA:  Abdominal pain and distension EXAM: ABDOMEN - 1 VIEW COMPARISON:  08/10/2019 FINDINGS: Scattered large and small bowel gas is noted. Persistent small bowel dilatation is seen an increased when compared with the prior exam. Right percutaneous nephrostomy catheter is again noted. No free air is seen. No focal mass lesion is noted. No bony abnormality is noted. IMPRESSION: Increasing small bowel dilatation when compare with the prior exam consistent with a worsening obstruction. Electronically Signed   By: Inez Catalina M.D.   On: 08/16/2019 14:09   Dg Abd 1 View  Result Date: 08/10/2019 CLINICAL DATA:  68 year old male with a history of small bowel obstruction EXAM: ABDOMEN - 1 VIEW COMPARISON:  08/09/2019 FINDINGS: Similar appearance of the lower chest. Gastric tube terminates within the stomach. Unchanged appearance of the right percutaneous nephrostomy. Dilated small bowel loops within the mid abdomen. Paucity of colonic and rectal gas. Small formed stool within the rectum. No unexpected radiopaque foreign body. No displaced fracture IMPRESSION: Dilated small bowel loops indicating ongoing obstruction/ileus. Relative absence of colonic gas. Unchanged gastric tube and right percutaneous nephrostomy. Electronically Signed   By: Corrie Mckusick D.O.   On: 08/10/2019 08:09   Dg Abd 1 View  Result Date: 08/09/2019 CLINICAL DATA:  Small bowel obstruction EXAM: ABDOMEN - 1 VIEW COMPARISON:  08/07/2019 FINDINGS: NG tube tip is within the fundus of the stomach, unchanged. Right side nephrostomy catheter again noted, unchanged. There are dilated small bowel loops compatible with small bowel obstruction, slightly progressed since prior study. No organomegaly or free air. IMPRESSION: Small-bowel  dilatation slightly progressed since prior study. Findings compatible with continued small bowel obstruction. Electronically Signed   By: Rolm Baptise M.D.   On: 08/09/2019 09:42   Ct  Abdomen Pelvis W Contrast  Result Date: 08/07/2019 CLINICAL DATA:  Emesis for 2 days, history of bladder cancer EXAM: CT ABDOMEN AND PELVIS WITH CONTRAST TECHNIQUE: Multidetector CT imaging of the abdomen and pelvis was performed using the standard protocol following bolus administration of intravenous contrast. CONTRAST:  138mL OMNIPAQUE IOHEXOL 300 MG/ML  SOLN COMPARISON:  PET-CT, 07/25/2019 FINDINGS: Lower chest: No acute abnormality. Metastatic nodules in the partially included lingula (series 4, image 9). Emphysema. Hepatobiliary: No solid liver abnormality is seen. No gallstones, gallbladder wall thickening, or biliary dilatation. Pancreas: Unremarkable. No pancreatic ductal dilatation or surrounding inflammatory changes. Spleen: Normal in size without significant abnormality. Adrenals/Urinary Tract: Adrenal glands are unremarkable. Right-sided percutaneous nephrostomy with formed pigtail in the right renal pelvis. No hydronephrosis. Redemonstrated, bulky, predominantly endoluminal bladder mass and soft tissue nodules. Stomach/Bowel: There is redemonstrated, extensive, bulky, metastatic tissue involving the omentum, multiple loops of small bowel, and the anterior abdominal wall. The stomach and proximal to mid small bowel are diffusely fluid distended, with an abrupt transition point in the central abdomen abutting metastatic soft tissue (series 2, image 59) and decompression of the distal small bowel. Vascular/Lymphatic: Aortic atherosclerosis. Multiple enlarged retroperitoneal, iliac, and pelvic sidewall lymph nodes as seen on prior examination. Reproductive: No mass or other significant abnormality. Other: No abdominal wall hernia or abnormality. No abdominopelvic ascites. Musculoskeletal: No acute or significant osseous  findings. IMPRESSION: 1. There is redemonstrated, extensive, bulky, metastatic tissue involving the omentum, multiple loops of small bowel, and the anterior abdominal wall, not significant changed in appearance compared to prior PET-CT examination dated 07/25/2019. The stomach and proximal to mid small bowel are diffusely fluid distended, with an abrupt transition point in the central abdomen abutting metastatic soft tissue (series 2, image 59) and decompression of the distal small bowel. Findings are consistent with small bowel obstruction. 2. Other findings of primary bladder malignancy and advanced metastatic disease as detailed above, not significantly changed in comparison to PET-CT dated 07/25/2019. 3. Aortic Atherosclerosis (ICD10-I70.0) and Emphysema (ICD10-J43.9). Electronically Signed   By: Eddie Candle M.D.   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 EXAM: NUCLEAR MEDICINE PET SKULL BASE TO THIGH TECHNIQUE: 8.795 mCi F-18 FDG was injected intravenously. Full-ring PET imaging was performed from the skull base to thigh after the radiotracer. CT data was obtained and used for attenuation correction and anatomic localization. Fasting blood glucose: 93 mg/dl COMPARISON:  None. FINDINGS: Mediastinal blood pool activity: SUV max 1.36 . Liver activity: SUV max NA NECK: Enlarged and hypermetabolic left level 2 lymph nodes. Nodal mass measures 3.4 by 1.9 cm and has an SUV max of 7.06 Incidental CT findings: none CHEST: Hypermetabolic right paratracheal, subcarinal and bilateral hilar lymph nodes are identified. Right paratracheal node measures 1.4 cm and has an SUV max of 10.38. Index subcarinal node measures 1.1 cm and has an SUV max of 8.93. Index left hilar node measures 1.2 cm and has an SUV max of 6.41. Right hilar lymph node measures 1.2 cm and has an SUV max 8.66. Right CP  angle lymph node is FDG avid measuring 1.4 cm with SUV max of 7.57. Bilateral FDG avid pulmonary nodules. Right lower lobe lung nodule measures 1 cm and has an SUV max of 1.88. Hypermetabolic subpleural nodule in the lingula measures 1 cm and has an SUV max of 6.6. Incidental CT findings: Advanced changes of emphysema.  Aortic atherosclerosis. ABDOMEN/PELVIS: There is a few small foci of FDG uptake above liver background activity without definite CT correlate, equivocal for metastasis. For example, within segment 8 there is an area of increased uptake within SUV max of 2.67. This is compared with background liver activity of 2.15. No abnormal uptake within the pancreas, spleen or adrenal glands. Soft tissue mass involving the bladder measures 9 cm within SUV max of 15.7. Hypermetabolic right retrocrural lymph node measures 1 cm and has an SUV max of 7.13. Multiple hypermetabolic retroperitoneal and bilateral iliac and inguinal lymph nodes identified. Index left periaortic node measures 1.1 cm within SUV max of 8.01. Index retrocaval lymph node measures 1.3 cm and has an SUV max of 6.25. Left common iliac node measures 1.3 cm and has an SUV max of 1.2. Right common iliac node measures 1.5 cm and has an SUV max of 6.4. Left external iliac node measures 1.8 cm with SUV max of 8.79. Left inguinal node measures 0.7 cm and has an SUV max of 4.87. Left pericolic gutter soft tissue mass measures 12.4 cm and has SUV max of 11.9. Right lateral abdominal mass measures 8 cm and has SUV max of 12.07. Large mass within the ventral abdomen measures 16.6 cm within SUV max of 11.36. Small FDG avid peritoneal implants are identified along the surface of the liver. Incidental CT findings: Right-sided percutaneous nephrostomy tube is in place. No hydronephrosis identified bilaterally. Aortic atherosclerosis. SKELETON: No focal hypermetabolic activity to suggest skeletal metastasis. Incidental CT findings: none IMPRESSION: 1. Large mass  within the urinary bladder is identified and is hypermetabolic concerning for primary urothelial neoplasm. 2. Hypermetabolic left cervical, thoracic, abdominal, and pelvic lymph nodes consistent with metastatic adenopathy. 3. Extensive, bulky, hypermetabolic peritoneal disease within the abdomen and pelvis. 4. Bilateral pulmonary nodules concerning for metastatic disease. 5. A few small foci (without CT correlate) of mild FDG uptake are noted in the liver which are equivocal for metastasis. 6. No findings to suggest osseous metastasis. 7. Aortic Atherosclerosis (ICD10-I70.0) and Emphysema (ICD10-J43.9). Electronically Signed   By: Kerby Moors M.D.   On: 07/25/2019 13:30   Dg Chest Portable 1 View  Result Date: 08/07/2019 CLINICAL DATA:  Elevated white count, emesis for 3 days. EXAM: PORTABLE CHEST 1 VIEW COMPARISON:  Chest radiograph dated 07/21/2019 FINDINGS: The heart size and mediastinal contours are within normal limits. Emphysematous changes are noted. Both lungs are clear. The visualized skeletal structures are unremarkable. A left subclavian approach central venous port catheter tip overlies the superior vena cava. A pigtail catheter overlies the right upper quadrant. IMPRESSION: No acute cardiopulmonary findings. Emphysema (ICD10-J43.9). Electronically Signed   By: Zerita Boers M.D.   On: 08/07/2019 15:17   Dg Chest Port 1 View  Result Date: 07/21/2019 CLINICAL DATA:  Port placement.  Metastatic bladder cancer. EXAM: PORTABLE CHEST 1 VIEW COMPARISON:  Chest x-ray dated Feb 24, 2014. FINDINGS: New left chest wall port catheter with tip in the mid SVC. The heart size and mediastinal contours are within normal limits. Chronically coarsened interstitial markings with emphysematous changes. Small area of nodularity at the left lung base. No focal consolidation, pleural effusion, or pneumothorax. No acute osseous abnormality. IMPRESSION: 1. New appropriately positioned left chest wall port catheter  without complicating feature. 2. Small area of nodularity at the left lung base. Metastatic disease is not excluded. Electronically Signed   By: Titus Dubin M.D.   On: 07/21/2019 19:39   Dg Abd Portable 1 View  Result Date:  08/07/2019 CLINICAL DATA:  NG tube placement EXAM: PORTABLE ABDOMEN - 1 VIEW COMPARISON:  CT abdomen/pelvis 08/07/2019 FINDINGS: Nasogastric tube with the tip projecting over the stomach. There is ill-defined dilatation of the small bowel consistent with bowel obstruction. There is no evidence of pneumoperitoneum, portal venous gas or pneumatosis. There are no pathologic calcifications along the expected course of the ureters. Right percutaneous nephrostomy tube. The osseous structures are unremarkable. IMPRESSION: 1. Nasogastric tube with the tip projecting over the stomach. Electronically Signed   By: Kathreen Devoid   On: 08/07/2019 17:39   Dg C-arm 1-60 Min-no Report  Result Date: 07/21/2019 Fluoroscopy was utilized by the requesting physician.  No radiographic interpretation.    Assessment and plan-  68 year old male metastatic bladder cancer, not yet started on chemotherapy, current admitted due to SBO.  #Small bowel obstruction in the setting of widely metastatic bladder cancer with peritoneal carcinomatosis, no intervention per surgery given peritoneal carcinomatosis. Clinically stable on clear liquid diet. Continue Reglan.  Sandostatin LAR 20mg  IM x 1 today-  Patient has agreed on hospice.  Will be going to hospice facility tomorrow morning. Discussed with patient, daughter at the bedside and talk to another daughter came over the phone. Discussed with Dr.Patel CODE STATUS DNR/DNI.  We spent sufficient time to discuss many aspect of care, questions were answered to patient's satisfaction.   Earlie Server, MD, PhD Hematology Oncology [facility] Pager- [phone] 08/18/2019

## 2019-08-18 NOTE — Progress Notes (Signed)
[facility] Day(s): 11.   Interval History: Patient seen and examined, no acute events or new complaints overnight. Patient reports he had 2-3 bowel movements yesterday. No abdominal pain, distension, nausea, or emesis. He continues to pass flatus. Tolerating the full liquid diet without issue. Mobilizing. Plans to go to hospice house.   Vital signs in last 24 hours: [min-max] current  Temp:  [97.4 F (36.3 C)-97.8 F (36.6 C)] 97.8 F (36.6 C) (11/05 0830) Pulse Rate:  [74-93] 92 (11/05 0830) Resp:  [16-18] 18 (11/05 0830) BP: (118-138)/(68-85) 130/77 (11/05 0830) SpO2:  [96 %-97 %] 96 % (11/05 0830) Weight:  [73.5 kg] 73.5 kg (11/05 0600)       Weight: 73.5 kg BMI (Calculated): 21.38   Intake/Output last 2 shifts:  11/04 0701 - 11/05 0700 In: 2340 [P.O.:2340] Out: 400 [Urine:400]   Physical Exam:  Constitutional: alert, cooperative and no distress  HENT: normocephalic without obvious abnormality Eyes: PERRL, EOM's grossly intact and symmetric  Respiratory: breathing non-labored at rest Chest:Port site in left upper chest healing well, dressing in place. Cardiovascular: regular rate and sinus rhythm  Gastrointestinal: soft, non-tender,mild distension. There is a palpable firm mass in the supraumbilical region. No rebound/guarding Genitourinary: Nephrostomy tube in place, making good urine   Labs:  CBC Latest Ref Rng & Units 08/15/2019 08/14/2019 08/13/2019  WBC 4.0 - 10.5 K/uL 10.4 11.4(H) 12.0(H)  Hemoglobin 13.0 - 17.0 g/dL 13.6 13.7 13.0  Hematocrit 39.0 - 52.0 % 40.9 40.9 39.7  Platelets 150 - 400 K/uL 61(L) 74(L) 107(L)   CMP Latest Ref Rng & Units 08/14/2019 08/13/2019 08/12/2019  Glucose 70 - 99 mg/dL 115(H) 146(H) 132(H)  BUN 8 - 23 mg/dL 23 22 26(H)  Creatinine 0.61 - 1.24 mg/dL 0.72 0.66 0.81  Sodium 135 - 145 mmol/L 134(L) 138 143  Potassium 3.5 - 5.1 mmol/L 3.8 3.8 4.1  Chloride 98 - 111 mmol/L 104 109  112(H)  CO2 22 - 32 mmol/L 22 23 16(L)  Calcium 8.9 - 10.3 mg/dL 8.2(L) 8.3(L) 8.5(L)  Total Protein 6.5 - 8.1 g/dL - - -  Total Bilirubin 0.3 - 1.2 mg/dL - - -  Alkaline Phos 38 - 126 U/L - - -  AST 15 - 41 U/L - - -  ALT 0 - 44 U/L - - -     Imaging studies: No new pertinent imaging studies   Assessment/Plan:  68 y.o. male with clinically improved partial small bowel obstruction related to peritoneal carcinomatosis, complicated by pertinent comorbidities includingmetastatic bladder CA   - Continue on liquid diet              - If he develops nausea/emesis then he may require placement of NGT which he understands   - Serial abdominal examination +/- KUBs - Appreciate oncology and palliative consults - Again, this is a very difficult situation and surgical intervention has low yield given likelihood of peritoneal carcinomatosis and possible frozen abdomen. Will continue conservative measures described above - pain control prn - mobilization encouraged - Further management per primary service     - Plans for discharge to hospice house; no surgical issues at this time; general surgery will sign off  All of the above findings and recommendations were discussed with the patient, and the medical team, and all of patient's questions were answered to his expressed satisfaction.  -- Edison Simon, PA-C [facility] 08/18/2019, 8:40 AM [phone] M-F: 7am - 4pm

## 2019-08-19 MED ORDER — MORPHINE SULFATE (CONCENTRATE) 10 MG/0.5ML PO SOLN
10.0000 mg | ORAL | Status: DC | PRN
  Administered 2019-08-19: 11:00:00 10 mg via ORAL
  Filled 2019-08-19: qty 0.5

## 2019-08-19 MED ORDER — MORPHINE SULFATE (CONCENTRATE) 10 MG/0.5ML PO SOLN: 10.0000 mg | ORAL | 0 refills | Status: AC | PRN

## 2019-08-19 MED ORDER — METOCLOPRAMIDE HCL 5 MG/5ML PO SOLN: 10.0000 mg | Freq: Three times a day (TID) | ORAL | 0 refills | Status: AC

## 2019-08-19 MED ORDER — HYDROMORPHONE HCL 1 MG/ML IJ SOLN
1.0000 mg | Freq: Once | INTRAMUSCULAR | Status: AC
  Administered 2019-08-19: 1 mg via INTRAVENOUS
  Filled 2019-08-19: qty 1

## 2019-08-19 NOTE — Progress Notes (Signed)
Pt left at this time for hospice home via ems.  Pain med given earlier eased pts pain no pain now family at bedside

## 2019-08-19 NOTE — Progress Notes (Signed)
Follow up visit mad to new referral for [facility] hospice home. Patient seen sitting up in bed c/o back pain, student RN in to assess and give medications. Daughter [name] at bedside and remains in agreement with transfer today. Writer to call report to the hospice home and contact EMS for transport. Hospital care team updated. [name] BSN, RN, [place] [phone]

## 2019-08-19 NOTE — Discharge Summary (Signed)
[facility] NAME: Johnny Martin    MR#:  [id number]  DATE OF BIRTH:  10-06-1951  DATE OF ADMISSION:  08/07/2019 ADMITTING PHYSICIAN: Harrie Foreman, MD  DATE OF DISCHARGE: 08/19/2019  PRIMARY CARE PHYSICIAN: Earlie Server, MD    ADMISSION DIAGNOSIS:  Dehydration [E86.0] SBO (small bowel obstruction) ([place]) [K56.609] AKI (acute kidney injury) ([place]) [N17.9] Nausea and vomiting, intractability of vomiting not specified, unspecified vomiting type [R11.2] Sepsis, due to unspecified organism, unspecified whether acute organ dysfunction present ([place]) [A41.9]  DISCHARGE DIAGNOSIS:  Small bowel obstruction secondary to metastatic bladder cancer with peritoneal carcinomatosis.   SECONDARY DIAGNOSIS:   Past Medical History:  Diagnosis Date  . Bladder cancer ([place]) 07/21/2019  . Cancer [facility])    Bladder cancer    HOSPITAL COURSE:   Small bowel obstruction secondary to metastatic bladder cancer with peritoneal carcinomatosis.  -Not a surgical candidate secondary to extensive metastatic burden in abdomen. Followed by general surgery.  -Treated with octreotide and metoclopramide--will change to oral reglan at d/c --recievedoctreotide and dexamethasone per oncology--now d/ced since pt is comfort care only --Oncology  Agrees with family's wish to transition to hospice and comfort care -appreciatedPalliative medicine. -Dr Tasia Catchings d/w dter [name] --ok with comfort care and to hospice home today  Thrombocytopenia --Likely due to consumption.   Status post port removalfor skin erosion. --Continue local wound care.  Metastatic bladder cancer. Prognosis is poor.  -Oncology has recommended hospice--to Hospice home today.   Acute kidney injury --Resolved.  Severe malnutrition in the context of chronic illness --Continueboost or Ensureas able  D/c today.   CONSULTS OBTAINED:    DRUG ALLERGIES:  No Known Allergies  DISCHARGE  MEDICATIONS:   Allergies as of 08/19/2019   No Known Allergies     Medication List    STOP taking these medications   dexamethasone 4 MG tablet Commonly known as: DECADRON   metoprolol tartrate 25 MG tablet Commonly known as: LOPRESSOR   ondansetron 8 MG tablet Commonly known as: Zofran   oxyCODONE 5 MG immediate release tablet Commonly known as: Roxicodone   pantoprazole 40 MG tablet Commonly known as: PROTONIX   senna-docusate 8.6-50 MG tablet Commonly known as: Senokot-S     TAKE these medications   acetaminophen 325 MG tablet Commonly known as: TYLENOL Take 325 mg by mouth every 4 (four) hours as needed for headache.   metoCLOPramide 5 MG/5ML solution Commonly known as: REGLAN Take 10 mLs (10 mg total) by mouth 4 (four) times daily -  before meals and at bedtime.   morphine CONCENTRATE 10 MG/0.5ML Soln concentrated solution Take 0.5 mLs (10 mg total) by mouth every 2 (two) hours as needed for severe pain.   nicotine 21 mg/24hr patch Commonly known as: NICODERM CQ - dosed in mg/24 hours One patch transdermal chest wall daily (okay to substitute generic patch) What changed:   how much to take  how to take this  when to take this  additional instructions   polyethylene glycol 17 g packet Commonly known as: MIRALAX / GLYCOLAX Take 17 g by mouth daily as needed for moderate constipation or severe constipation. What changed: reasons to take this   prochlorperazine 10 MG tablet Commonly known as: COMPAZINE Take 1 tablet (10 mg total) by mouth every 6 (six) hours as needed (Nausea or vomiting).       If you experience worsening of your admission symptoms, develop shortness of breath, life threatening emergency, suicidal or homicidal  thoughts you must seek medical attention immediately by calling 911 or calling your MD immediately  if symptoms less severe.  You Must read complete instructions/literature along with all the possible adverse reactions/side  effects for all the Medicines you take and that have been prescribed to you. Take any new Medicines after you have completely understood and accept all the possible adverse reactions/side effects.   Please note  You were cared for by a hospitalist during your hospital stay. If you have any questions about your discharge medications or the care you received while you were in the hospital after you are discharged, you can call the unit and asked to speak with the hospitalist on call if the hospitalist that took care of you is not available. Once you are discharged, your primary care physician will handle any further medical issues. Please note that NO REFILLS for any discharge medications will be authorized once you are discharged, as it is imperative that you return to your primary care physician (or establish a relationship with a primary care physician if you do not have one) for your aftercare needs so that they can reassess your need for medications and monitor your lab values. Today   SUBJECTIVE  No new complaints   VITAL SIGNS:  Blood pressure 117/66, pulse 89, temperature (!) 97.5 F (36.4 C), temperature source Oral, resp. rate 16, weight 73.5 kg, SpO2 97 %.  I/O:    Intake/Output Summary (Last 24 hours) at 08/19/2019 0901 Last data filed at 08/19/2019 0600 Gross per 24 hour  Intake 760 ml  Output 400 ml  Net 360 ml    PHYSICAL EXAMINATION:  GENERAL:  68 y.o.-year-old patient lying in the bed with no acute distress. Chronically ill, thin, cacehctic EYES: Pupils equal, round, reactive to light and accommodation. No scleral icterus.  HEENT: Head atraumatic, normocephalic. Oropharynx and nasopharynx clear.  LUNGS: Normal breath sounds bilaterally, no wheezing, rales,rhonchi or crepitation. No use of accessory muscles of respiration.  CARDIOVASCULAR: S1, S2 normal. No murmurs, rubs, or gallops.  ABDOMEN:  firm, +distended. Bowel sounds present. EXTREMITIES: + pedal edema, no  cyanosis, or clubbing.   Pt is comfort care only    DATA REVIEW:   CBC  Recent Labs  Lab 08/15/19 0440  WBC 10.4  HGB 13.6  HCT 40.9  PLT 61*    Chemistries  Recent Labs  Lab 08/14/19 0542  NA 134*  K 3.8  CL 104  CO2 22  GLUCOSE 115*  BUN 23  CREATININE 0.72  CALCIUM 8.2*    Microbiology Results   No results found for this or any previous visit (from the past 240 hour(s)).  RADIOLOGY:  No results found.   CODE STATUS:     Code Status Orders  (From admission, onward)         Start     Ordered   08/09/19 1347  Do not attempt resuscitation (DNR)  Continuous    Question Answer Comment  In the event of cardiac or respiratory ARREST Do not call a "code blue"   In the event of cardiac or respiratory ARREST Do not perform Intubation, CPR, defibrillation or ACLS   In the event of cardiac or respiratory ARREST Use medication by any route, position, wound care, and other measures to relive pain and suffering. May use oxygen, suction and manual treatment of airway obstruction as needed for comfort.      08/09/19 1346        Code Status History  Date Active Date Inactive Code Status Order ID Comments User Context   08/07/2019 2016 08/09/2019 1346 Partial Code [id number]  Harrie Foreman, MD Inpatient   08/07/2019 1956 08/07/2019 2016 DNR [id number]  Harrie Foreman, MD Inpatient   07/11/2019 1659 07/14/2019 1531 DNR [id number]  Vaughan Basta, MD ED   Advance Care Planning Activity      TOTAL TIME TAKING CARE OF THIS PATIENT: 40* minutes.    Fritzi Mandes M.D on 08/19/2019 at 9:01 AM  Between 7am to 6pm - Pager - [phone] After 6pm go to [email or web address] - password [email or web address]  [facility]    CC: Primary care physician; Earlie Server, MD

## 2019-08-19 NOTE — TOC Transition Note (Signed)
Transition of Care [facility]) - CM/SW Discharge Note   Patient Details  Name: CRISTEN LEBEAU MRN: [id number] Date of Birth: 1951-03-26  Transition of Care [facility]) CM/SW Contact:  Shelbie Hutching, RN Phone Number: 08/19/2019, 12:10 PM   Clinical Narrative:    Patient is discharging to residential hospice facility, [facility] in [place] today.  Patient will transport via EMS.     Final next level of care: [place] Barriers to Discharge: Barriers Resolved   Patient Goals and CMS Choice Patient states their goals for this hospitalization and ongoing recovery are:: Wants to go to the hospice home CMS Medicare.gov Compare Post Acute Care list provided to:: Patient Choice offered to / list presented to : Patient  Discharge Placement              Patient [facility] bed at: [facility]) Patient to be transferred to facility by: [facility] Name of family member notified: [name] and [name]- daughter's Patient and family notified of of transfer: 08/19/19  Discharge Plan and Services In-house Referral: Hospice / [place], Nutrition Discharge Planning Services: CM Consult Post Acute Care Choice: Hospice                               Social Determinants of Health (SDOH) Interventions     Readmission Risk Interventions No flowsheet data found.

## 2019-09-13 DEATH — deceased

## 2020-08-05 IMAGING — DX DG CHEST 1V PORT
1 series · 1 of 1 positions shown · non-contrast
Comparison: Chest x-ray dated February 24, 2014.

CLINICAL DATA: Port placement.  Metastatic bladder cancer.

EXAM:
PORTABLE CHEST 1 VIEW

[chest ap]
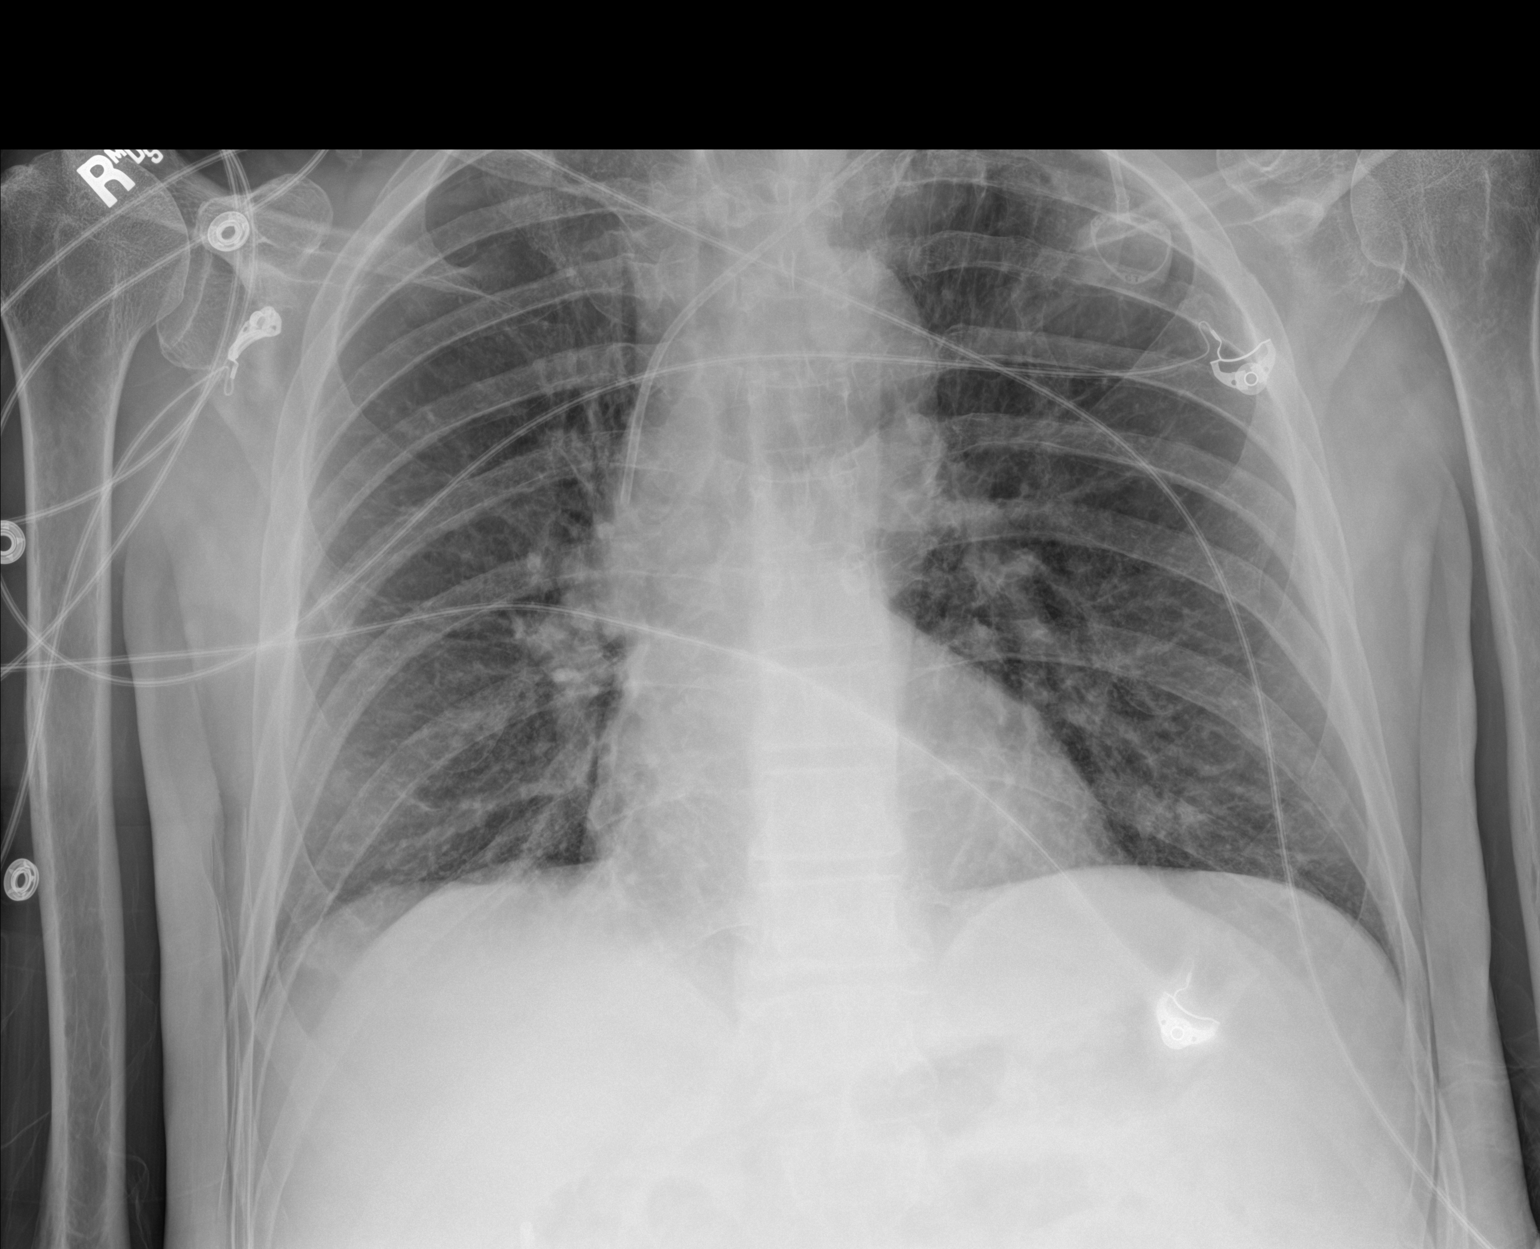

[1 of 1 positions shown; findings below may reference images not displayed]

FINDINGS: New left chest wall port catheter with tip in the mid SVC. The heart
size and mediastinal contours are within normal limits. Chronically
coarsened interstitial markings with emphysematous changes. Small
area of nodularity at the left lung base. No focal consolidation,
pleural effusion, or pneumothorax. No acute osseous abnormality.
IMPRESSION: 1. New appropriately positioned left chest wall port catheter
without complicating feature.
2. Small area of nodularity at the left lung base. Metastatic
disease is not excluded.

## 2020-08-25 IMAGING — DX DG ABDOMEN 1V
3 series · 3 of 3 positions shown · non-contrast
Comparison: 08/09/2019

CLINICAL DATA: 68-year-old male with a history of small bowel
obstruction

EXAM:
ABDOMEN - 1 VIEW

[abdomen supine (1 of 3)]
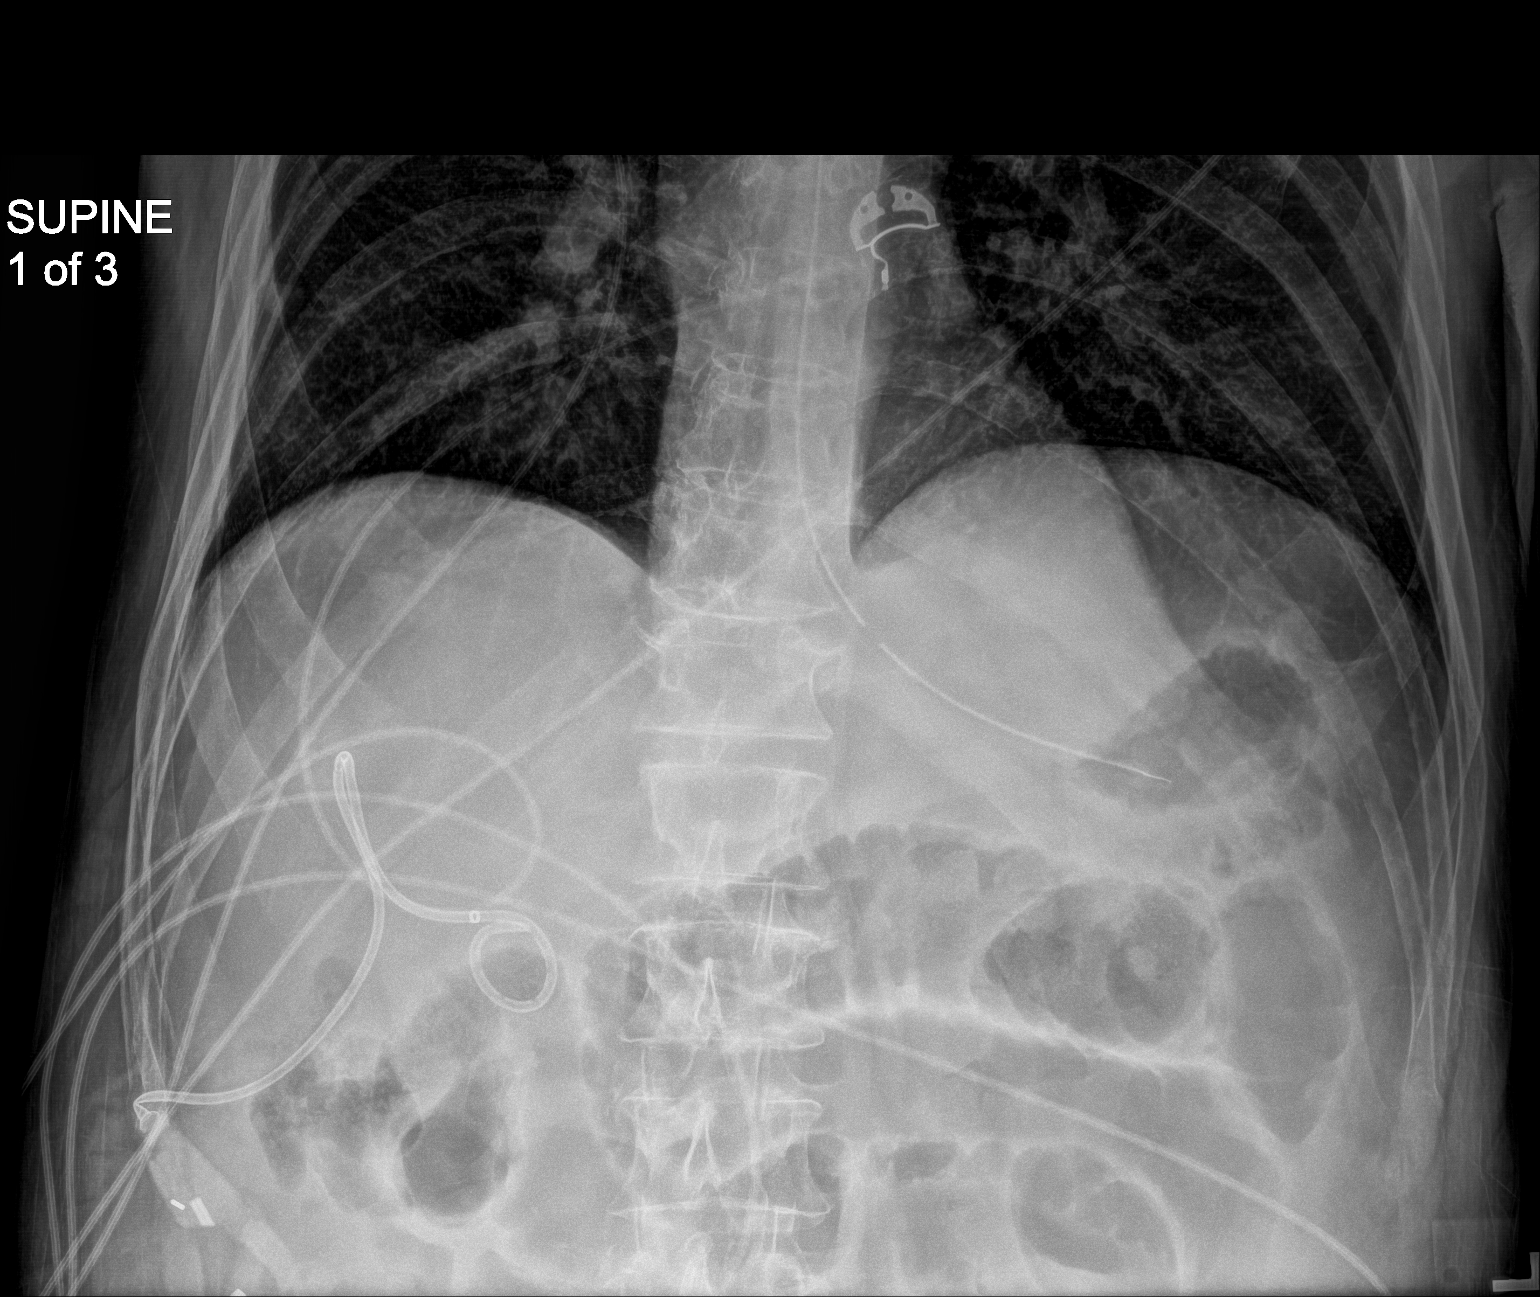

[abdomen supine (2 of 3)]
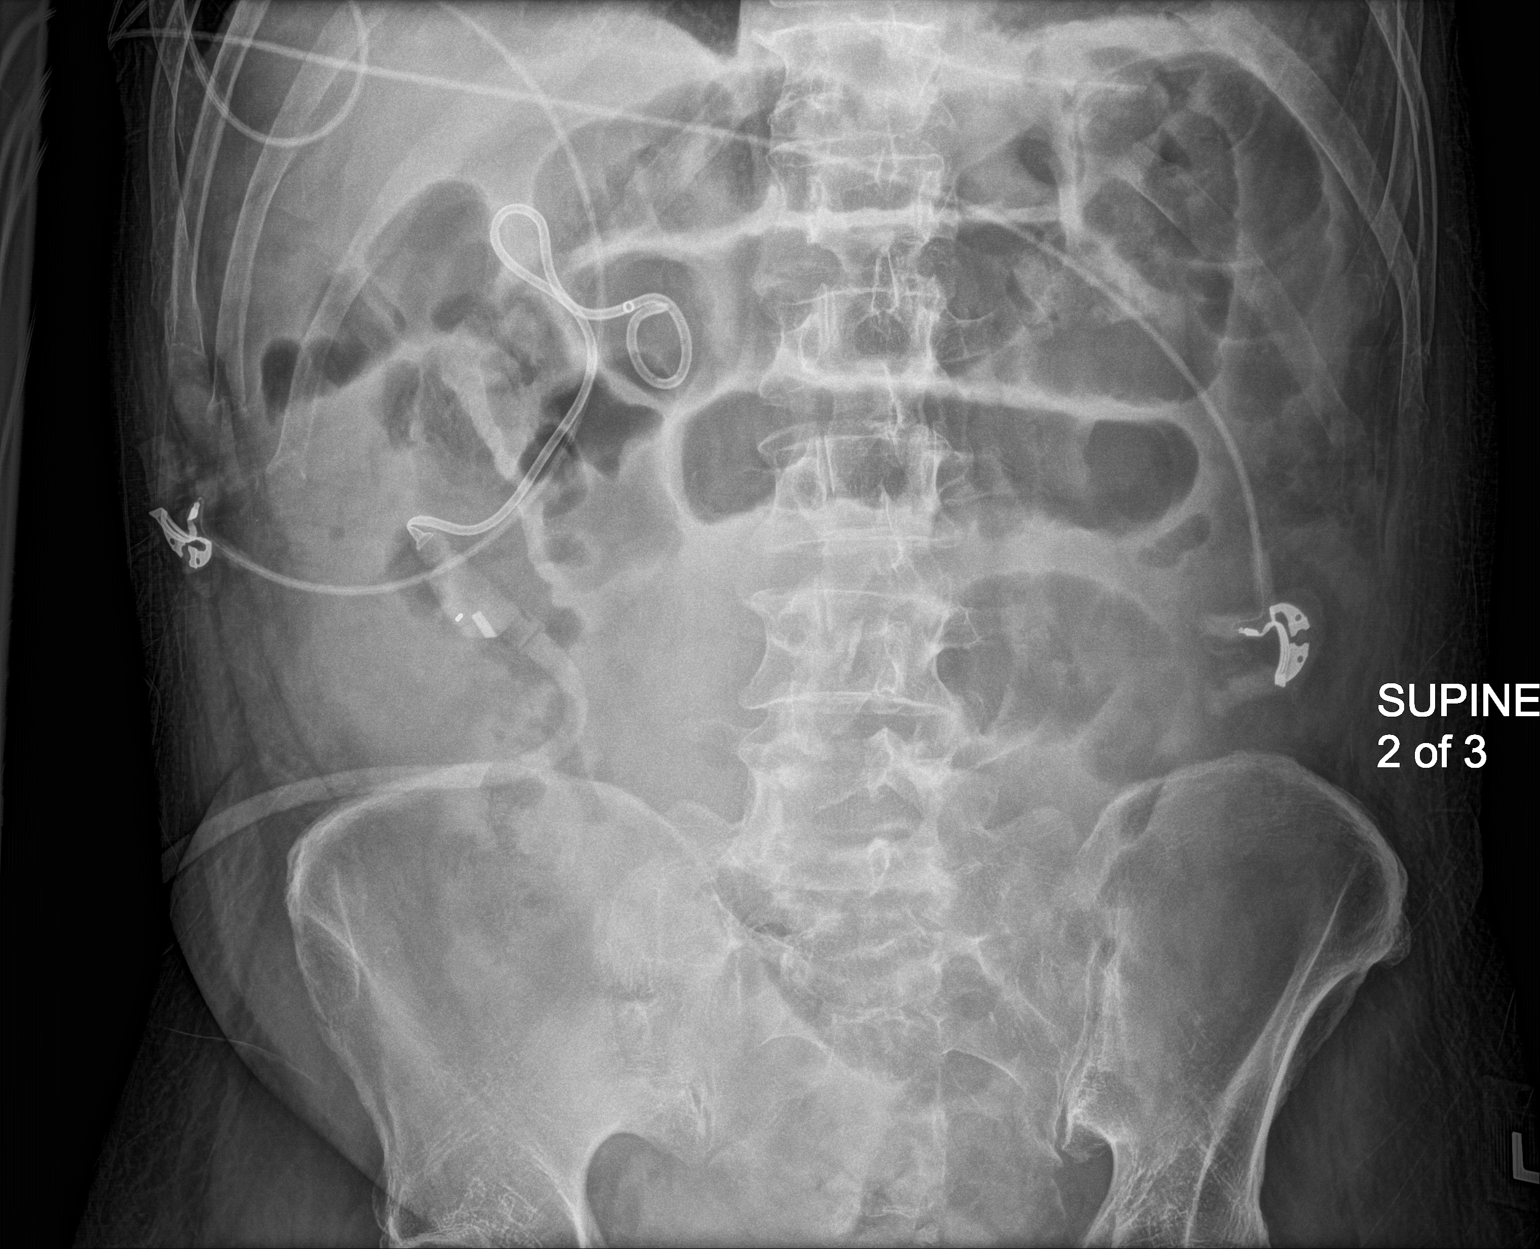

[abdomen supine (3 of 3)]
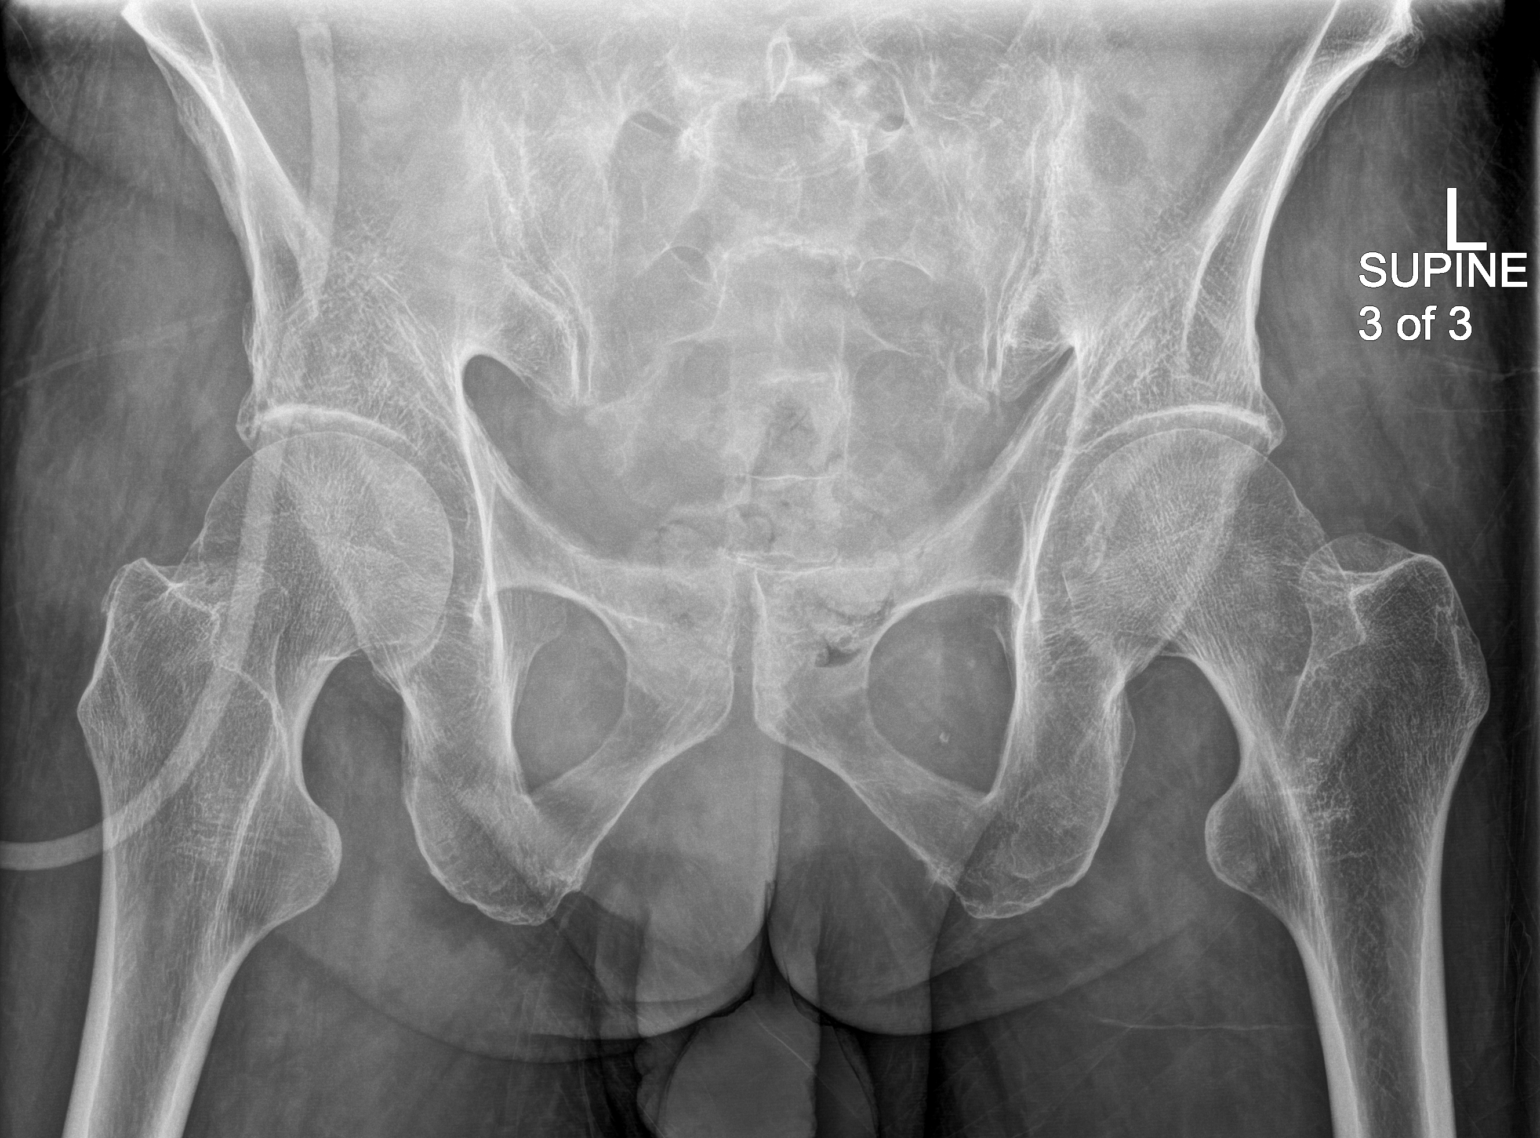

[3 of 3 positions shown; findings below may reference images not displayed]

FINDINGS: Similar appearance of the lower chest. Gastric tube terminates
within the stomach.

Unchanged appearance of the right percutaneous nephrostomy.

Dilated small bowel loops within the mid abdomen. Paucity of colonic
and rectal gas. Small formed stool within the rectum.

No unexpected radiopaque foreign body.

No displaced fracture
IMPRESSION: Dilated small bowel loops indicating ongoing obstruction/ileus.
Relative absence of colonic gas.

Unchanged gastric tube and right percutaneous nephrostomy.
